# Patient Record
Sex: Female | Born: 1950
Health system: Southern US, Community
[De-identification: ages and names within clinical notes are randomized; demographics above are authoritative.]

## PROBLEM LIST (undated history)

## (undated) DIAGNOSIS — I480 Paroxysmal atrial fibrillation: Secondary | ICD-10-CM

## (undated) DIAGNOSIS — E039 Hypothyroidism, unspecified: Secondary | ICD-10-CM

## (undated) DIAGNOSIS — I499 Cardiac arrhythmia, unspecified: Secondary | ICD-10-CM

## (undated) DIAGNOSIS — D219 Benign neoplasm of connective and other soft tissue, unspecified: Secondary | ICD-10-CM

## (undated) DIAGNOSIS — M810 Age-related osteoporosis without current pathological fracture: Secondary | ICD-10-CM

## (undated) DIAGNOSIS — R011 Cardiac murmur, unspecified: Secondary | ICD-10-CM

## (undated) DIAGNOSIS — IMO0002 Reserved for concepts with insufficient information to code with codable children: Secondary | ICD-10-CM

## (undated) DIAGNOSIS — I4892 Unspecified atrial flutter: Secondary | ICD-10-CM

## (undated) DIAGNOSIS — M1990 Unspecified osteoarthritis, unspecified site: Secondary | ICD-10-CM

## (undated) DIAGNOSIS — F329 Major depressive disorder, single episode, unspecified: Secondary | ICD-10-CM

## (undated) DIAGNOSIS — F32A Depression, unspecified: Secondary | ICD-10-CM

## (undated) HISTORY — PX: TONSILLECTOMY: SUR1361

## (undated) HISTORY — PX: FRACTURE SURGERY: SHX138

## (undated) HISTORY — DX: Paroxysmal atrial fibrillation: I48.0

## (undated) HISTORY — DX: Unspecified atrial flutter: I48.92

## (undated) HISTORY — PX: BREAST ENHANCEMENT SURGERY: SHX7

## (undated) HISTORY — DX: Major depressive disorder, single episode, unspecified: F32.9

## (undated) HISTORY — DX: Benign neoplasm of connective and other soft tissue, unspecified: D21.9

## (undated) HISTORY — PX: ABDOMINAL HYSTERECTOMY: SHX81

## (undated) HISTORY — DX: Depression, unspecified: F32.A

## (undated) HISTORY — DX: Hypothyroidism, unspecified: E03.9

## (undated) HISTORY — DX: Reserved for concepts with insufficient information to code with codable children: IMO0002

---

## 1981-08-07 HISTORY — PX: AUGMENTATION MAMMAPLASTY: SUR837

## 1997-12-08 ENCOUNTER — Other Ambulatory Visit: Admission: RE | Admit: 1997-12-08 | Discharge: 1997-12-08 | Payer: Self-pay | Admitting: Obstetrics and Gynecology

## 1998-03-06 ENCOUNTER — Emergency Department (HOSPITAL_COMMUNITY): Admission: EM | Admit: 1998-03-06 | Discharge: 1998-03-06 | Payer: Self-pay | Admitting: Emergency Medicine

## 1998-12-13 ENCOUNTER — Other Ambulatory Visit: Admission: RE | Admit: 1998-12-13 | Discharge: 1998-12-13 | Payer: Self-pay | Admitting: Gynecology

## 1999-12-14 ENCOUNTER — Other Ambulatory Visit: Admission: RE | Admit: 1999-12-14 | Discharge: 1999-12-14 | Payer: Self-pay | Admitting: Obstetrics and Gynecology

## 2000-12-05 LAB — HM PAP SMEAR

## 2000-12-14 ENCOUNTER — Other Ambulatory Visit: Admission: RE | Admit: 2000-12-14 | Discharge: 2000-12-14 | Payer: Self-pay | Admitting: Obstetrics and Gynecology

## 2001-08-07 HISTORY — PX: COSMETIC SURGERY: SHX468

## 2003-06-12 ENCOUNTER — Ambulatory Visit (HOSPITAL_COMMUNITY): Admission: RE | Admit: 2003-06-12 | Discharge: 2003-06-12 | Payer: Self-pay | Admitting: Gastroenterology

## 2004-02-14 ENCOUNTER — Ambulatory Visit (HOSPITAL_BASED_OUTPATIENT_CLINIC_OR_DEPARTMENT_OTHER): Admission: RE | Admit: 2004-02-14 | Discharge: 2004-02-14 | Payer: Self-pay | Admitting: Gastroenterology

## 2006-05-28 ENCOUNTER — Emergency Department (HOSPITAL_COMMUNITY): Admission: EM | Admit: 2006-05-28 | Discharge: 2006-05-28 | Payer: Self-pay | Admitting: Family Medicine

## 2006-12-06 ENCOUNTER — Ambulatory Visit (HOSPITAL_BASED_OUTPATIENT_CLINIC_OR_DEPARTMENT_OTHER): Admission: RE | Admit: 2006-12-06 | Discharge: 2006-12-06 | Payer: Self-pay | Admitting: Podiatry

## 2007-03-23 ENCOUNTER — Emergency Department (HOSPITAL_COMMUNITY): Admission: EM | Admit: 2007-03-23 | Discharge: 2007-03-23 | Payer: Self-pay | Admitting: Family Medicine

## 2007-03-28 ENCOUNTER — Ambulatory Visit (HOSPITAL_COMMUNITY): Admission: RE | Admit: 2007-03-28 | Discharge: 2007-03-29 | Payer: Self-pay | Admitting: Orthopaedic Surgery

## 2008-01-27 ENCOUNTER — Ambulatory Visit (HOSPITAL_COMMUNITY): Admission: RE | Admit: 2008-01-27 | Discharge: 2008-01-27 | Payer: Self-pay | Admitting: Obstetrics and Gynecology

## 2008-10-13 ENCOUNTER — Emergency Department (HOSPITAL_COMMUNITY): Admission: EM | Admit: 2008-10-13 | Discharge: 2008-10-13 | Payer: Self-pay | Admitting: Emergency Medicine

## 2009-06-21 ENCOUNTER — Emergency Department (HOSPITAL_COMMUNITY): Admission: EM | Admit: 2009-06-21 | Discharge: 2009-06-21 | Payer: Self-pay | Admitting: Family Medicine

## 2009-06-24 ENCOUNTER — Ambulatory Visit (HOSPITAL_BASED_OUTPATIENT_CLINIC_OR_DEPARTMENT_OTHER): Admission: RE | Admit: 2009-06-24 | Discharge: 2009-06-24 | Payer: Self-pay | Admitting: Orthopedic Surgery

## 2009-10-29 ENCOUNTER — Encounter: Admission: RE | Admit: 2009-10-29 | Discharge: 2009-10-29 | Payer: Self-pay | Admitting: Obstetrics and Gynecology

## 2010-03-22 ENCOUNTER — Emergency Department (HOSPITAL_COMMUNITY): Admission: EM | Admit: 2010-03-22 | Discharge: 2010-03-22 | Payer: Self-pay | Admitting: Emergency Medicine

## 2010-10-07 ENCOUNTER — Other Ambulatory Visit: Payer: Self-pay | Admitting: Obstetrics and Gynecology

## 2010-10-07 DIAGNOSIS — Z1231 Encounter for screening mammogram for malignant neoplasm of breast: Secondary | ICD-10-CM

## 2010-11-08 ENCOUNTER — Ambulatory Visit
Admission: RE | Admit: 2010-11-08 | Discharge: 2010-11-08 | Disposition: A | Discharge status: Home / Self Care | Payer: 59 | Source: Ambulatory Visit | Attending: Obstetrics and Gynecology | Admitting: Obstetrics and Gynecology

## 2010-11-08 DIAGNOSIS — Z1231 Encounter for screening mammogram for malignant neoplasm of breast: Secondary | ICD-10-CM

## 2010-11-09 LAB — ANAEROBIC CULTURE: Gram Stain: NONE SEEN

## 2010-11-09 LAB — WOUND CULTURE
Culture: NO GROWTH
Gram Stain: NONE SEEN
Gram Stain: NONE SEEN

## 2010-12-20 NOTE — Op Note (Signed)
Terri [name], Terri [name]               ACCOUNT NO.:  [id number]   MEDICAL RECORD NO.:  [id number]          PATIENT TYPE:  OIB   LOCATION:  5034                         FACILITY:  [facility]   PHYSICIAN:  Vanita Panda. Magnus Ivan, M.D.DATE OF BIRTH:  April 25, 1951   DATE OF PROCEDURE:  03/28/2007  DATE OF DISCHARGE:  03/29/2007                               OPERATIVE REPORT   PREOPERATIVE DIAGNOSIS:  Right displaced radial styloid fracture.   POSTOPERATIVE DIAGNOSIS:  Right displaced radial styloid fracture.   PROCEDURE:  Open reduction and percutaneous pinning, right wrist radial  styloid fracture.   SURGEON:  Vanita Panda. Magnus Ivan, MD   ANESTHESIA:  General.   ANTIBIOTICS:  600 mg of IV clindamycin.   TOURNIQUET TIME:  Fifty-eight minutes   BLOOD LOSS:  Minimal.   COMPLICATIONS:  None.   INDICATIONS:  Briefly, Ms. Terri [name] is a very pleasant right-hand-  dominant 59 year old who was jogging which she sustained a fall on  Saturday.  She had x-rays obtained of her right wrist, which was in  pain, which showed her to have an intra-articular distal radius fracture  which was more of a radial styloid fracture.  She was placed in a splint  and given time for swelling to subside.  Due to the displaced nature of  the fracture and the intra-articular component, I recommended she  undergo an operative intervention.  This is her dominant wrist as well.  She agreed to proceed with surgery.   PROCEDURE DESCRIPTION:  After informed consent was obtained, the  appropriate right arm was marked, at which time she was brought the  operating room and placed supine on the operating table.  General  anesthesia was then obtained.  A nonsterile tourniquet was placed around  her right upper arm.  Her arm was then prepped and draped with Betadine  scrub and paint.  A timeout was called and she was identified as the  correct patient and the correct extremity.  I then used an Esmarch to  wrap out the wrist  and the tourniquet was inflated to 250 mL of  pressure.  Using a fluoroscopic unit/C-arm, I was able to assess the  wrist and after some manipulation, closed, I was able to manipulate the  radial styloid into an anatomically reduced position.  I then made an  incision over the dorsoradial aspect of the wrist to identify the radial  styloid as well as to protect the superficial radial nerve.  I then  passed a 0.025 K-wire from the radial styloid into the distal radius  metaphysis section of bone; I supplemented this with a second 0.045 K-  wire.  In AP lateral and PA planes, this showed the fracture to be  anatomically reduced.  The remainder of the carpal bones appeared to  align well.  I then copiously irrigated the wound and closed the deep  tissue with 2-0 Vicryl followed by interrupted 4-0 Prolene on the skin.  The pins were bent and cut outside of the skin and pin caps were placed.  I then placed Xeroform over the incision and put her in a short  arm  thumb spica  plaster splint.  The tourniquet was let down and the fingers then pinked  up nicely.  Total tourniquet time was 58 minutes.  Blood loss was  minimal.  There were no complications noted and final counts were  correct.  The patient was awakened, extubated and taken to the recovery  room in stable condition.      Vanita Panda. Magnus Ivan, M.D.  Electronically Signed     CYB/MEDQ  D:  03/28/2007  T:  03/29/2007  Job:  [id number]

## 2010-12-23 NOTE — Op Note (Signed)
Terri [name], Terri [name]               ACCOUNT NO.:  [id number]   MEDICAL RECORD NO.:  [id number]          PATIENT TYPE:  AMB   LOCATION:  DSC                          FACILITY:  [facility]   PHYSICIAN:  Ysidro Evert. Regal, D.P.M. DATE OF BIRTH:  28-Mar-1951   DATE OF PROCEDURE:  12/06/2006  DATE OF DISCHARGE:  12/06/2006                               OPERATIVE REPORT   PREOPERATIVE DIAGNOSIS:  Exostosis, left hallux.   POSTOPERATIVE DIAGNOSIS:  Exostosis, left hallux.   PROCEDURE:  Exostectomy of the left hallux to be performed.   INDICATIONS:  Chronic discomfort and inability to wear shoe gear without  difficulty.  Has tried wider shoes.  Has tried different modalities  without relief of symptoms.   SURGEON:  Lenn Sink, D.P.M.   The patient was brought to the OR and placed in the supine position on  the OR table, the patient injected with 5 mL of a Xylocaine-Marcaine  mixture.  The patient's left foot was prepped and draped utilizing  standard aseptic technique.  The left foot was exsanguinated utilizing  Esmarch and the left ankle tourniquet was inflated to 250 mmHg.  The  following procedure was performed.  Attention was directed to the dorsal  aspect left foot, where an approximate 4-cm linear incision was made.  The incision was deepened down to subcutaneous tissue with a linear  capsular incision.  The capsular tissue was sharply dissected off the  underlying bone, revealing a hypertrophied area off of the left hallux  at the interphalangeal joint.  Utilizing power saw, the hyperostosis was  resected flush with all surfaces and was rasped smooth.  The wound was  flushed with a copious amount of sterile __________ solution.  The wound  edges were coapted with 5-0 nylon, a sterile dressing applied,  tourniquet released.  The patient was sent to the recovery room in  satisfactory condition and discharged by anesthesia.           ______________________________  Ysidro Evert. Regal,  D.P.M.     NSR/MEDQ  D:  03/26/2007  T:  03/27/2007  Job:  [id number]

## 2010-12-23 NOTE — Procedures (Signed)
NAME:  Terri [name], Terri [name]             ACCOUNT NO.:  [id number]   MEDICAL RECORD NO.:  [id number]          PATIENT TYPE:  OUT   LOCATION:  SLEEP CENTER                 FACILITY:  [facility]   PHYSICIAN:  Clinton D. Maple Hudson, M.D. DATE OF BIRTH:  05-23-1951   DATE OF ADMISSION:  02/14/2004  DATE OF DISCHARGE:  02/14/2004                              NOCTURNAL POLYSOMNOGRAM   REFFERRING PHYSICIAN:  Dr.  Tasia Catchings   INDICATION FOR STUDY/HISTORY:  Insomnia with sleep apnea.  Complaint of  waking fatigue, snoring, falling asleep watching TV, restless,  nonrestorative sleep, vivid dreams.  Home medications include Zoloft and  Premarin.  Neck size was 12.5 inches, BMI 20.9, weight 118 pounds, Epworth  sleepiness score 12/24.  A split night study protocol was requested.  No  medications were taken before sleep.   SLEEP ARCHITECTURE:  Sleep of 296 minutes were recorded starting at 10:25  p.m. with a sleep efficiency of 74%.  Of total sleep time, 7% was stage 1,  76% stage 2, 6% stage 3 and 4, 11% REM.  Sleep latency was eight minutes.  REM latency was 250 minutes.   RESPIRATORY DATA:  Split protocol was followed.  Baseline RDI was 35.7  obstructive events per hour consistent with moderately severe obstructive  sleep apnea/hypopnea during the initial 158 minutes before CPAP.  This  included 36 obstructive apneas and 46 hypopneas.  All events and all sleep  were while on her back.  RDI during REM was 3.7 per hour.   OXYGEN DATA:  Snoring was moderate.  Mean oxygen saturation through the  study was 95% with mild desaturation to 86% during apneas.   CARDIAC DATA:  Sinus bradycardia around 44-49 beats per minute with no  significant ectopics noted.   MOVEMENT/PARASOMNIA:  There were 267 limb jerks recorded of which 30 were  associated with arousal for a periodic limb movement with arousal index of  6.1 events per hour, indicating mild to moderate periodic limb movement  syndrome as a possible  additional cause of sleep disturbance.   IMPRESSION/RECOMMENDATION:  Moderately severe obstructive sleep  apnea/hypopnea with successful CPAP titration to 9 CWP using a petite  Respironics soft gel mask.  Mild oxygen desaturation before CPAP.  Periodic  limb movement syndrome.                                   ______________________________                                Rennis Chris Maple Hudson, M.D.                                Diplomate, American Board of Sleep Medicine    CDY/MEDQ  D:  02/21/2004 15:01:40  T:  02/21/2004 23:37:22  Job:  [id number]

## 2010-12-23 NOTE — Op Note (Signed)
NAMEAMBRIANA, Terri [name]               ACCOUNT NO.:  [id number]   MEDICAL RECORD NO.:  [id number]          PATIENT TYPE:  AMB   LOCATION:  DSC                          FACILITY:  [facility]   PHYSICIAN:  Ysidro Evert. Regal, D.P.M. DATE OF BIRTH:  04-25-1951   DATE OF PROCEDURE:  02/22/2007  DATE OF DISCHARGE:                               OPERATIVE REPORT   PREOPERATIVE DIAGNOSIS:  Hypertrophy condyle left hallux.   POSTOPERATIVE DIAGNOSIS:  Hypertrophy condyle left hallux.   PROCEDURE:  Excision of hypertrophy condyle left hallux.   INDICATIONS:  Chronic discomfort, inability to wear shoe gear  comfortably.  The patient has tried wider shoes, has tried padding  without relief of symptoms.   SURGEON:  Dr. Charlsie Merles.   OPERATIVE REPORT:  The patient was brought to the OR and placed in a  supine position on the OR table.  The patient was injected with a total  of 5 mL of a lidocaine and Marcaine mixture.  The patient's left foot  was prepped and draped utilizing aseptic technique, and a left ankle  tourniquet was  inflated to 250 mmHg.  The following procedure was  performed.   Attention was directed to the dorsomedial aspect of the left hallux  where a 4 cm linear incision was made.  The incision was deepened  through the subcutaneous tissue and down to capsule, where a linear  capsule incision was made.  The capsule tissue was sharply dissection  off the underlying bone revealing a prominent hypertrophy condyle, and  was resected at this position.  The patient tolerated this well, and the  area was flushed and found to be in excellent position.  The skin  margins were reapproximated utilizing 5-0 nylon, and the area was  infiltrated with a 0.5 mL of dexamethasone.  A dry sterile  compressive  dressing applied to the left foot.  The left ankle tourniquet was  deflated.  Capillary fill was noted to be immediate to all digits of the  left foot, and the patient tolerated the surgery and anesthesia  well,  and was transferred out of the OR in satisfactory condition.           ______________________________  Ysidro Evert Regal, D.P.M.     NSR/MEDQ  D:  02/22/2007  T:  02/22/2007  Job:  [id number]

## 2011-05-19 LAB — CBC
HCT: 37.4
MCV: 94.9

## 2011-10-19 ENCOUNTER — Other Ambulatory Visit (HOSPITAL_COMMUNITY): Payer: 59

## 2011-10-19 ENCOUNTER — Emergency Department (HOSPITAL_COMMUNITY)
Admit: 2011-10-19 | Discharge: 2011-10-19 | Disposition: A | Discharge status: Home / Self Care | Payer: 59 | Attending: Ophthalmology | Admitting: Ophthalmology

## 2011-10-19 ENCOUNTER — Other Ambulatory Visit: Payer: Self-pay | Admitting: Internal Medicine

## 2011-10-19 ENCOUNTER — Encounter (HOSPITAL_COMMUNITY): Payer: Self-pay | Admitting: Emergency Medicine

## 2011-10-19 ENCOUNTER — Emergency Department (HOSPITAL_COMMUNITY)
Admission: EM | Admit: 2011-10-19 | Discharge: 2011-10-19 | Disposition: A | Discharge status: Home / Self Care | Payer: 59 | Attending: Emergency Medicine | Admitting: Emergency Medicine

## 2011-10-19 DIAGNOSIS — Z79899 Other long term (current) drug therapy: Secondary | ICD-10-CM | POA: Insufficient documentation

## 2011-10-19 DIAGNOSIS — R51 Headache: Secondary | ICD-10-CM

## 2011-10-19 DIAGNOSIS — H02402 Unspecified ptosis of left eyelid: Secondary | ICD-10-CM

## 2011-10-19 DIAGNOSIS — R519 Headache, unspecified: Secondary | ICD-10-CM

## 2011-10-19 DIAGNOSIS — G9389 Other specified disorders of brain: Secondary | ICD-10-CM | POA: Insufficient documentation

## 2011-10-19 DIAGNOSIS — H02409 Unspecified ptosis of unspecified eyelid: Secondary | ICD-10-CM | POA: Insufficient documentation

## 2011-10-19 DIAGNOSIS — H5710 Ocular pain, unspecified eye: Secondary | ICD-10-CM | POA: Insufficient documentation

## 2011-10-19 MED ORDER — GADOBENATE DIMEGLUMINE 529 MG/ML IV SOLN
10.0000 mL | Freq: Once | INTRAVENOUS | Status: AC
  Administered 2011-10-19: 10 mL via INTRAVENOUS

## 2011-10-19 NOTE — ED Notes (Signed)
Pt sent to ED for MRI/MRA of neck, brain and orbits.  Pt c/o left eye drooping and pain behind eye x's 6 days.

## 2011-10-19 NOTE — ED Notes (Signed)
Pt in MRI.

## 2011-10-19 NOTE — ED Provider Notes (Signed)
History     CSN: [id number]  Arrival date & time 10/19/11  1638   First MD Initiated Contact with Patient 10/19/11 2001      Chief Complaint  Patient presents with  . Eye Pain    (Consider location/radiation/quality/duration/timing/severity/associated sxs/prior treatment) Patient is a 61 y.o. female presenting with eye pain. The history is provided by the patient.  Eye Pain This is a new problem. The current episode started more than 1 week ago. The problem occurs constantly. Associated symptoms include headaches.    History reviewed. No pertinent past medical history.  Past Surgical History  Procedure Date  . Abdominal hysterectomy   . Breast enhancement surgery   . Fracture surgery     No family history on file.  History  Substance Use Topics  . Smoking status: Never Smoker   . Smokeless tobacco: Not on file  . Alcohol Use: Yes     1 glass wine per night    OB History    Grav Para Term Preterm Abortions TAB SAB Ect Mult Living                  Review of Systems  Eyes: Positive for pain. Negative for photophobia, redness and visual disturbance.  Respiratory: Negative for choking.   Neurological: Positive for facial asymmetry and headaches. Negative for tremors and syncope.    Allergies  Review of patient's allergies indicates no known allergies.  Home Medications   Current Outpatient Rx  Name Route Sig Dispense Refill  . BIOTIN 5000 MCG PO CAPS Oral Take 1 capsule by mouth daily.    Marland Kitchen CALCIUM CARBONATE-VITAMIN D 500-200 MG-UNIT PO TABS Oral Take 1 tablet by mouth daily.    Marland Kitchen ESTROGENS CONJUGATED 0.45 MG PO TABS Oral Take 0.45 mg by mouth daily. Take daily for 21 days then do not take for 7 days.    . OMEGA-3 FATTY ACIDS 1000 MG PO CAPS Oral Take 1 g by mouth daily.    Marland Kitchen LEVOTHYROXINE SODIUM 50 MCG PO TABS Oral Take 50 mcg by mouth daily.    Marland Kitchen MAGNESIUM GLUCONATE 500 MG PO TABS Oral Take 500 mg by mouth daily.    . ADULT MULTIVITAMIN W/MINERALS CH Oral  Take 1 tablet by mouth daily.    . SERTRALINE HCL 100 MG PO TABS Oral Take 150 mg by mouth daily.      BP 100/65  Pulse 60  Temp(Src) 97.6 F (36.4 C) (Oral)  Resp 14  SpO2 98%  Physical Exam  Constitutional: She is oriented to person, place, and time. She appears well-developed.  HENT:  Head: Normocephalic.  Eyes: Conjunctivae and EOM are normal. Pupils are equal, round, and reactive to light.       Mild ptosis on the left side.   Cardiovascular: Normal rate.   Pulmonary/Chest: Effort normal.  Neurological: She is alert and oriented to person, place, and time. A cranial nerve deficit is present.  Skin: Skin is warm.    ED Course  Procedures (including critical care time)  Labs Reviewed - No data to display Mr Maxine Glenn Head Wo Contrast  10/19/2011  *RADIOLOGY REPORT*  Clinical Data:  Horner's syndrome  MRI HEAD WITHOUT AND WITH CONTRAST MRA HEAD WITHOUT CONTRAST MRA NECK WITHOUT AND WITH CONTRAST  Technique:  Multiplanar, multiecho pulse sequences of the brain and surrounding structures were obtained without and with intravenous contrast.  Angiographic images of the Circle of Willis were obtained using MRA technique without intravenous contrast. Angiographic images  of the neck were obtained using MRA technique without and with intravenous contrast.  Carotid stenosis measurements (when applicable) are obtained utilizing NASCET criteria, using the distal internal carotid diameter as the denominator.  Contrast:  15 ml Multihance  Comparison:   None.  MRI HEAD  Findings:  Diffusion imaging does not show any acute or subacute infarction.  The brainstem and cerebellum are normal.  The cerebral hemispheres show mild chronic small vessel change within the hemispheric white matter.  No cortical or large vessel territory abnormality.  No mass lesion, hemorrhage, hydrocephalus or extra- axial collection.  No pituitary mass.  No inflammatory sinus disease.  IMPRESSION: No acute or reversible findings.   Mild chronic small vessel change of the hemispheric white matter.  MRA HEAD  Findings: Both internal carotid arteries are widely patent into the brain.  The anterior and middle cerebral vessels appear normal without proximal stenosis, aneurysm or vascular malformation.  Both posterior cerebral arteries take a fetal origin from the anterior circulation.  The posterior circulation is diminutive because of the fetal origin.  The right vertebral artery terminates in pica.  The left vertebral arteries patent to the basilar.  IMPRESSION: No acquired or significant vascular pathology.  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  Diminutive posterior circulation in that setting.  MRA NECK  Findings: The branching pattern of brachiocephalic vessels from the arch is normal.  No origin stenoses.  Both common carotid arteries are widely patent to their respective bifurcation.  Both carotid bifurcations are normal.  Both cervical internal carotid arteries are normal.  Both vertebral arteries are normal, though diminutive. No subclavian pathology is evident.  IMPRESSION: Normal MR angiography of the neck vessels.  Original Report Authenticated By: Thomasenia Sales, M.D.   Mr Angiogram Neck W Wo Contrast  10/19/2011  *RADIOLOGY REPORT*  Clinical Data:  Horner's syndrome  MRI HEAD WITHOUT AND WITH CONTRAST MRA HEAD WITHOUT CONTRAST MRA NECK WITHOUT AND WITH CONTRAST  Technique:  Multiplanar, multiecho pulse sequences of the brain and surrounding structures were obtained without and with intravenous contrast.  Angiographic images of the Circle of Willis were obtained using MRA technique without intravenous contrast. Angiographic images of the neck were obtained using MRA technique without and with intravenous contrast.  Carotid stenosis measurements (when applicable) are obtained utilizing NASCET criteria, using the distal internal carotid diameter as the  denominator.  Contrast:  15 ml Multihance  Comparison:   None.  MRI HEAD  Findings:  Diffusion imaging does not show any acute or subacute infarction.  The brainstem and cerebellum are normal.  The cerebral hemispheres show mild chronic small vessel change within the hemispheric white matter.  No cortical or large vessel territory abnormality.  No mass lesion, hemorrhage, hydrocephalus or extra- axial collection.  No pituitary mass.  No inflammatory sinus disease.  IMPRESSION: No acute or reversible findings.  Mild chronic small vessel change of the hemispheric white matter.  MRA HEAD  Findings: Both internal carotid arteries are widely patent into the brain.  The anterior and middle cerebral vessels appear normal without proximal stenosis, aneurysm or vascular malformation.  Both posterior cerebral arteries take a fetal origin from the anterior circulation.  The posterior circulation is diminutive because of the fetal origin.  The right vertebral artery terminates in pica.  The left vertebral arteries patent to the basilar.  IMPRESSION: No acquired or significant vascular pathology.  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  Diminutive posterior circulation in that setting.  MRA NECK  Findings: The branching pattern of brachiocephalic vessels from the arch is normal.  No origin stenoses.  Both common carotid arteries are widely patent to their respective bifurcation.  Both carotid bifurcations are normal.  Both cervical internal carotid arteries are normal.  Both vertebral arteries are normal, though diminutive. No subclavian pathology is evident.  IMPRESSION: Normal MR angiography of the neck vessels.  Original Report Authenticated By: Thomasenia Sales, M.D.   Mr Laqueta Jean Wo Contrast  10/19/2011  *RADIOLOGY REPORT*  Clinical Data:  Horner's syndrome  MRI HEAD WITHOUT AND WITH CONTRAST MRA HEAD WITHOUT CONTRAST MRA NECK WITHOUT AND WITH CONTRAST   Technique:  Multiplanar, multiecho pulse sequences of the brain and surrounding structures were obtained without and with intravenous contrast.  Angiographic images of the Circle of Willis were obtained using MRA technique without intravenous contrast. Angiographic images of the neck were obtained using MRA technique without and with intravenous contrast.  Carotid stenosis measurements (when applicable) are obtained utilizing NASCET criteria, using the distal internal carotid diameter as the denominator.  Contrast:  15 ml Multihance  Comparison:   None.  MRI HEAD  Findings:  Diffusion imaging does not show any acute or subacute infarction.  The brainstem and cerebellum are normal.  The cerebral hemispheres show mild chronic small vessel change within the hemispheric white matter.  No cortical or large vessel territory abnormality.  No mass lesion, hemorrhage, hydrocephalus or extra- axial collection.  No pituitary mass.  No inflammatory sinus disease.  IMPRESSION: No acute or reversible findings.  Mild chronic small vessel change of the hemispheric white matter.  MRA HEAD  Findings: Both internal carotid arteries are widely patent into the brain.  The anterior and middle cerebral vessels appear normal without proximal stenosis, aneurysm or vascular malformation.  Both posterior cerebral arteries take a fetal origin from the anterior circulation.  The posterior circulation is diminutive because of the fetal origin.  The right vertebral artery terminates in pica.  The left vertebral arteries patent to the basilar.  IMPRESSION: No acquired or significant vascular pathology.  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  Diminutive posterior circulation in that setting.  MRA NECK  Findings: The branching pattern of brachiocephalic vessels from the arch is normal.  No origin stenoses.  Both common carotid arteries are widely patent to their respective  bifurcation.  Both carotid bifurcations are normal.  Both cervical internal carotid arteries are normal.  Both vertebral arteries are normal, though diminutive. No subclavian pathology is evident.  IMPRESSION: Normal MR angiography of the neck vessels.  Original Report Authenticated By: Thomasenia Sales, M.D.     1. Ptosis of eyelid, left   2. Headache       MDM  Patient was seen by her ophthalmologist and sent him. She's had left eye ptosis and some pain behind her eye for about 6 days. She does not have a thought that he pupillary involvement. There is no sensation changes. She was sent in with orders for an MRI/MRA of the neck brain and head. They were negative. Discussed with the ophthalmologist, and he will see her in the office tomorrow        Juliet Rude. Rubin Payor, MD 10/19/11 [id number]

## 2011-10-20 ENCOUNTER — Other Ambulatory Visit: Payer: 59

## 2011-10-25 ENCOUNTER — Other Ambulatory Visit: Payer: Self-pay | Admitting: Obstetrics and Gynecology

## 2011-10-25 DIAGNOSIS — Z1231 Encounter for screening mammogram for malignant neoplasm of breast: Secondary | ICD-10-CM

## 2011-11-21 ENCOUNTER — Ambulatory Visit
Admission: RE | Admit: 2011-11-21 | Discharge: 2011-11-21 | Disposition: A | Discharge status: Home / Self Care | Payer: 59 | Source: Ambulatory Visit | Attending: Obstetrics and Gynecology | Admitting: Obstetrics and Gynecology

## 2011-11-21 DIAGNOSIS — Z1231 Encounter for screening mammogram for malignant neoplasm of breast: Secondary | ICD-10-CM

## 2012-10-23 ENCOUNTER — Other Ambulatory Visit: Payer: Self-pay

## 2012-10-23 DIAGNOSIS — Z1231 Encounter for screening mammogram for malignant neoplasm of breast: Secondary | ICD-10-CM

## 2012-11-21 ENCOUNTER — Ambulatory Visit
Admission: RE | Admit: 2012-11-21 | Discharge: 2012-11-21 | Disposition: A | Discharge status: Home / Self Care | Payer: 59 | Source: Ambulatory Visit

## 2012-11-21 DIAGNOSIS — Z1231 Encounter for screening mammogram for malignant neoplasm of breast: Secondary | ICD-10-CM

## 2012-12-20 ENCOUNTER — Ambulatory Visit (INDEPENDENT_AMBULATORY_CARE_PROVIDER_SITE_OTHER): Payer: 59 | Admitting: Family Medicine

## 2012-12-20 VITALS — BP 100/70 | HR 52 | Temp 97.7°F | Resp 16 | Ht 63.0 in | Wt 114.8 lb

## 2012-12-20 DIAGNOSIS — H9209 Otalgia, unspecified ear: Secondary | ICD-10-CM

## 2012-12-20 DIAGNOSIS — H9202 Otalgia, left ear: Secondary | ICD-10-CM

## 2012-12-20 DIAGNOSIS — S161XXA Strain of muscle, fascia and tendon at neck level, initial encounter: Secondary | ICD-10-CM

## 2012-12-20 DIAGNOSIS — S139XXA Sprain of joints and ligaments of unspecified parts of neck, initial encounter: Secondary | ICD-10-CM

## 2012-12-20 MED ORDER — METHOCARBAMOL 500 MG PO TABS: ORAL_TABLET | ORAL | Status: DC
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
## 2012-12-20 NOTE — Patient Instructions (Signed)
Aleve 2 tablets twice daily for pain and inflammation  Robaxin 500 mg one in the morning, one at noon, 2 at bedtime  CHOCOLATE!   It will cure anything.

## 2012-12-26 ENCOUNTER — Ambulatory Visit (INDEPENDENT_AMBULATORY_CARE_PROVIDER_SITE_OTHER): Payer: 59 | Admitting: Family Medicine

## 2012-12-26 VITALS — BP 98/58 | HR 59 | Temp 98.0°F | Resp 17 | Wt 115.0 lb

## 2012-12-26 DIAGNOSIS — R110 Nausea: Secondary | ICD-10-CM

## 2012-12-26 DIAGNOSIS — R51 Headache: Secondary | ICD-10-CM

## 2012-12-26 DIAGNOSIS — R42 Dizziness and giddiness: Secondary | ICD-10-CM
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
## 2012-12-31 ENCOUNTER — Ambulatory Visit (HOSPITAL_COMMUNITY): Payer: 59

## 2013-03-17 DIAGNOSIS — Z0271 Encounter for disability determination: Secondary | ICD-10-CM

## 2013-04-25 ENCOUNTER — Ambulatory Visit (INDEPENDENT_AMBULATORY_CARE_PROVIDER_SITE_OTHER): Payer: 59 | Admitting: Obstetrics and Gynecology

## 2013-04-25 ENCOUNTER — Encounter: Payer: Self-pay | Admitting: Obstetrics and Gynecology

## 2013-04-25 ENCOUNTER — Ambulatory Visit: Payer: Self-pay | Admitting: Obstetrics and Gynecology

## 2013-04-25 VITALS — BP 122/70 | HR 56 | Ht 63.0 in | Wt 117.5 lb

## 2013-04-25 DIAGNOSIS — Z01419 Encounter for gynecological examination (general) (routine) without abnormal findings: Secondary | ICD-10-CM

## 2013-04-25 DIAGNOSIS — M899 Disorder of bone, unspecified: Secondary | ICD-10-CM

## 2013-04-25 DIAGNOSIS — R1900 Intra-abdominal and pelvic swelling, mass and lump, unspecified site: Secondary | ICD-10-CM

## 2013-04-25 DIAGNOSIS — M8580 Other specified disorders of bone density and structure, unspecified site: Secondary | ICD-10-CM

## 2013-04-25 DIAGNOSIS — Z0000 Encounter for general adult medical examination without abnormal findings: Secondary | ICD-10-CM

## 2013-04-25 LAB — POCT URINALYSIS DIPSTICK
Bilirubin, UA: NEGATIVE
Blood, UA: NEGATIVE
Glucose, UA: NEGATIVE
Leukocytes, UA: NEGATIVE
Nitrite, UA: NEGATIVE
Protein, UA: NEGATIVE
Urobilinogen, UA: NEGATIVE
pH, UA: 5

## 2013-04-25 MED ORDER — ESTROGENS CONJUGATED 0.45 MG PO TABS: 0.4500 mg | ORAL_TABLET | Freq: Every day | ORAL | Status: DC

## 2013-04-25 NOTE — Patient Instructions (Signed)
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
## 2013-05-01 ENCOUNTER — Telehealth: Payer: Self-pay | Admitting: Obstetrics and Gynecology

## 2013-05-01 NOTE — Telephone Encounter (Signed)
Spoke with pt about Dr. Rica Records recommendation. Patient is scheduled for a repeat pelvic exam.

## 2013-05-14 ENCOUNTER — Encounter: Payer: Self-pay | Admitting: Obstetrics and Gynecology

## 2013-05-14 ENCOUNTER — Ambulatory Visit (INDEPENDENT_AMBULATORY_CARE_PROVIDER_SITE_OTHER): Payer: 59 | Admitting: Obstetrics and Gynecology

## 2013-05-14 VITALS — BP 110/68 | HR 64 | Ht 63.0 in | Wt 116.5 lb

## 2013-05-14 DIAGNOSIS — R1900 Intra-abdominal and pelvic swelling, mass and lump, unspecified site: Secondary | ICD-10-CM

## 2013-05-14 NOTE — Progress Notes (Signed)
Patient ID: Terri [name], female   DOB: 10-06-1950, 62 y.o.   MRN: [id number]  Subjective  Patient is here for a pelvic recheck   Noted to have a pelvic mass on routine exam on 04/25/13. No complaints.  Remodeling her house.  Objective  Pelvic exam - normal external genitalia and urethra. Vaginal cuff - no visible lesions. Bimanual - soft midline mass approximately 4 cm, nontenter.  Assessment  Possible pelvic mass.  Return for pelvic ultrasound.

## 2013-05-14 NOTE — Patient Instructions (Signed)
Please return for your pelvic ultrasound.

## 2013-05-15 ENCOUNTER — Telehealth: Payer: Self-pay | Admitting: Obstetrics and Gynecology

## 2013-05-15 NOTE — Telephone Encounter (Signed)
Patient called you back during lunch.

## 2013-05-15 NOTE — Telephone Encounter (Signed)
LMTCB to scheduling PUS.

## 2013-05-16 NOTE — Telephone Encounter (Signed)
Patient scheduled for 10/16 @ 11:30am.

## 2013-05-22 ENCOUNTER — Ambulatory Visit (INDEPENDENT_AMBULATORY_CARE_PROVIDER_SITE_OTHER): Payer: 59

## 2013-05-22 ENCOUNTER — Encounter: Payer: Self-pay | Admitting: Obstetrics and Gynecology

## 2013-05-22 ENCOUNTER — Ambulatory Visit (INDEPENDENT_AMBULATORY_CARE_PROVIDER_SITE_OTHER): Payer: 59 | Admitting: Obstetrics and Gynecology

## 2013-05-22 VITALS — BP 100/70 | HR 54 | Ht 63.0 in | Wt 115.5 lb

## 2013-05-22 DIAGNOSIS — R1907 Generalized intra-abdominal and pelvic swelling, mass and lump: Secondary | ICD-10-CM

## 2013-05-22 DIAGNOSIS — R1900 Intra-abdominal and pelvic swelling, mass and lump, unspecified site: Secondary | ICD-10-CM

## 2013-05-22 NOTE — Patient Instructions (Signed)
Please return for your usual annual exam!  I am glad that all is well with your pelvic ultrasound.

## 2013-05-22 NOTE — Progress Notes (Signed)
Subjective  Patient here for pelvic ultrasound.   Pelvic mass noted on examination. Status post hysterectomy.  Objective  See ultrasound below - normal ovaries, absent uterus, multiple loops of bowel at vaginal cuff.    Assessment  No pelvic mass noted.  Plan  Reassurance given. Follow up for yearly examination.

## 2013-06-10 ENCOUNTER — Ambulatory Visit
Admission: RE | Admit: 2013-06-10 | Discharge: 2013-06-10 | Disposition: A | Discharge status: Home / Self Care | Payer: 59 | Source: Ambulatory Visit | Attending: Obstetrics and Gynecology | Admitting: Obstetrics and Gynecology

## 2013-06-10 DIAGNOSIS — M8580 Other specified disorders of bone density and structure, unspecified site: Secondary | ICD-10-CM

## 2013-06-12 ENCOUNTER — Telehealth: Payer: Self-pay

## 2013-06-12 NOTE — Telephone Encounter (Signed)
Returning a call to Amanda.

## 2013-06-12 NOTE — Telephone Encounter (Signed)
Patient notified of results.

## 2013-06-12 NOTE — Telephone Encounter (Signed)
Message copied by Alphonsa Overall on Thu Jun 12, 2013  9:52 AM ------      Message from: Conley Simmonds      Created: Thu Jun 12, 2013  7:11 AM       Please inform the patient that her bone density is stable from her last examination.      She has osteopenia of the hip and a normal bone density of the spine.            Continue vitamin D, calcium, and exercise.            Next bone density in 2 years. ------

## 2013-06-12 NOTE — Telephone Encounter (Signed)
Called pt. LMOVM to call for bone density results.

## 2013-06-16 ENCOUNTER — Other Ambulatory Visit: Payer: Self-pay | Admitting: Gastroenterology

## 2013-07-25 ENCOUNTER — Other Ambulatory Visit: Payer: Self-pay | Admitting: Orthopedic Surgery

## 2013-07-25 ENCOUNTER — Telehealth: Payer: Self-pay | Admitting: Obstetrics and Gynecology

## 2013-07-25 NOTE — Telephone Encounter (Signed)
Patient would like to change Premarin to generic. [facility].

## 2013-07-25 NOTE — Telephone Encounter (Signed)
Spoke with pt whose husband is a Teacher, early years/pre. They have been doing some investigating and none of the [employer] plans will cover premarin. Pt isn't sure she really wants to stay on it. Pt still has more than a month's worth of it. Advised that Dr. Edward Jolly is out of town but will be back on Monday. Pt wondering what Dr. Edward Jolly suggests about discontinuing. Pt fine with waiting until next week.

## 2013-07-29 ENCOUNTER — Encounter (HOSPITAL_COMMUNITY)
Admission: RE | Disposition: A | Discharge status: Home / Self Care | Payer: Self-pay | Source: Ambulatory Visit | Attending: Gastroenterology

## 2013-07-29 ENCOUNTER — Encounter (HOSPITAL_COMMUNITY): Payer: Self-pay | Admitting: *Deleted

## 2013-07-29 ENCOUNTER — Ambulatory Visit (HOSPITAL_COMMUNITY)
Admission: RE | Admit: 2013-07-29 | Discharge: 2013-07-29 | Disposition: A | Discharge status: Home / Self Care | Payer: 59 | Source: Ambulatory Visit | Attending: Gastroenterology | Admitting: Gastroenterology

## 2013-07-29 DIAGNOSIS — E039 Hypothyroidism, unspecified: Secondary | ICD-10-CM | POA: Insufficient documentation

## 2013-07-29 DIAGNOSIS — I4891 Unspecified atrial fibrillation: Secondary | ICD-10-CM | POA: Insufficient documentation

## 2013-07-29 DIAGNOSIS — Z538 Procedure and treatment not carried out for other reasons: Secondary | ICD-10-CM | POA: Insufficient documentation

## 2013-07-29 DIAGNOSIS — G4730 Sleep apnea, unspecified: Secondary | ICD-10-CM | POA: Insufficient documentation

## 2013-07-29 DIAGNOSIS — Z1211 Encounter for screening for malignant neoplasm of colon: Secondary | ICD-10-CM | POA: Insufficient documentation

## 2013-07-29 HISTORY — PX: FLEXIBLE SIGMOIDOSCOPY: SHX5431

## 2013-07-29 HISTORY — DX: Unspecified osteoarthritis, unspecified site: M19.90

## 2013-07-29 SURGERY — SIGMOIDOSCOPY, FLEXIBLE
Anesthesia: Moderate Sedation

## 2013-07-29 MED ORDER — SODIUM CHLORIDE 0.9 % IV SOLN
INTRAVENOUS | Status: DC
  Administered 2013-07-29: 500 mL via INTRAVENOUS

## 2013-07-29 MED ORDER — MIDAZOLAM HCL 5 MG/5ML IJ SOLN
INTRAMUSCULAR | Status: DC | PRN
  Administered 2013-07-29: 5 mg
  Administered 2013-07-29: 2 mg via INTRAVENOUS

## 2013-07-29 MED ORDER — FENTANYL CITRATE 0.05 MG/ML IJ SOLN
INTRAMUSCULAR | Status: AC
  Filled 2013-07-29: qty 2

## 2013-07-29 MED ORDER — FENTANYL CITRATE 0.05 MG/ML IJ SOLN
INTRAMUSCULAR | Status: DC | PRN
  Administered 2013-07-29: 50 ug via INTRAVENOUS

## 2013-07-29 MED ORDER — MIDAZOLAM HCL 10 MG/2ML IJ SOLN
INTRAMUSCULAR | Status: AC
  Filled 2013-07-29: qty 2

## 2013-07-29 NOTE — H&P (Signed)
Procedure: Screening colonoscopy  History: The patient is a 62 year old female born [birth year]. She underwent a normal screening colonoscopy on 06/12/2003. She is scheduled to undergo a repeat screening colonoscopy today.  Past medical history: Depression. Sleep apnea. Degenerative joint disease of the knees. Hypothyroidism. Breast implants. Vaginal hysterectomy. Eyelid surgery. Fractured right wrist surgery.  Exam: The patient is alert and lying comfortably on the endoscopy stretcher. Lungs are clear to auscultation. Cardiac exam regular rhythm. Abdomen is soft, flat, and nontender to palpation.  Plan: Proceed with repeat screening colonoscopy

## 2013-07-29 NOTE — Progress Notes (Signed)
Dr. Laural Benes spoken with patient and husband regarding ekg.Dr Laural Benes will arrange for appointment with Dr Kevan Ny.

## 2013-07-29 NOTE — Telephone Encounter (Signed)
If patient would like to stop her estrogen therapy, I would suggest that she wean off and try to split the Premarin in half for the remainder of her prescription.  She may experience some rebound hot flashes, which should spontaneously resolve.    She will need to continue with calcium, vit D, and weight bearing exercise to protect her bones due to her osteopenia, as the estrogen is giving her some protection against osteoporosis.

## 2013-07-29 NOTE — Op Note (Signed)
Procedure: Screening flexible proctosigmoidoscopy to the splenic flexure  Endoscopist: Danise Edge  Premedication: Versed 7 mg. Fentanyl 50 mcg  Procedure: The patient was placed in the left lateral decubitus position. Anal inspection and digital rectal exam were normal. The Pentax pediatric colonoscope was introduced into the rectum and advanced to the splenic flexure. A complete colonoscopy was not performed because the colon prep was unsatisfactory.  Rectum. Normal. Retroflexed view of the distal rectum normal.  Sigmoid colon and descending colon. Dark liquid and solid stool were present in the left colon. The colonic mucosa was able to examine appeared normal.  Splenic flexure. Normal  Assessment:  #1. Unsatisfactory colon prep to screen for colon polyps.  #2. Normal screening flexible proctosigmoidoscopy associated with a poorly prepped colon  Recommendations: If the patient decides to undergo a screening colonoscopy using another colon prep, I would recommend using propofol sedation. If the patient decides not to undergo a screening colonoscopy, I will discuss all the Cologuard stool DNA screening for colon cancer to

## 2013-07-30 ENCOUNTER — Encounter (HOSPITAL_COMMUNITY): Payer: Self-pay | Admitting: Gastroenterology

## 2013-07-30 NOTE — Telephone Encounter (Signed)
Return call to patient, Terri [name].  

## 2013-07-30 NOTE — Telephone Encounter (Signed)
Patient notified of Dr Rica Records response.  She will try weaning off the Premarin and if she does not like how she feels, she will call back to see if generic for Premarin, ?estradiol, is an option for her.  Instructions for [facility] protection give per dr Rica Records recommendation.

## 2013-08-21 ENCOUNTER — Ambulatory Visit: Payer: 59 | Admitting: Interventional Cardiology

## 2013-08-28 ENCOUNTER — Encounter: Payer: Self-pay | Admitting: Cardiology

## 2013-08-28 ENCOUNTER — Ambulatory Visit (INDEPENDENT_AMBULATORY_CARE_PROVIDER_SITE_OTHER): Payer: 59 | Admitting: Cardiology

## 2013-08-28 ENCOUNTER — Encounter (INDEPENDENT_AMBULATORY_CARE_PROVIDER_SITE_OTHER): Payer: Self-pay

## 2013-08-28 VITALS — BP 107/43 | HR 54 | Ht 63.0 in | Wt 118.8 lb

## 2013-08-28 DIAGNOSIS — R0602 Shortness of breath: Secondary | ICD-10-CM

## 2013-08-28 DIAGNOSIS — R42 Dizziness and giddiness: Secondary | ICD-10-CM | POA: Insufficient documentation

## 2013-08-28 DIAGNOSIS — I4891 Unspecified atrial fibrillation: Secondary | ICD-10-CM

## 2013-08-28 DIAGNOSIS — I480 Paroxysmal atrial fibrillation: Secondary | ICD-10-CM

## 2013-08-28 DIAGNOSIS — E785 Hyperlipidemia, unspecified: Secondary | ICD-10-CM

## 2013-08-28 DIAGNOSIS — R002 Palpitations: Secondary | ICD-10-CM

## 2013-08-28 MED ORDER — ASCRIPTIN 325 MG PO TABS: 325.0000 mg | ORAL_TABLET | Freq: Every day | ORAL | Status: DC

## 2013-08-28 MED ORDER — ASPIRIN EC 325 MG PO TBEC: 325.0000 mg | DELAYED_RELEASE_TABLET | Freq: Every day | ORAL | Status: DC

## 2013-08-28 NOTE — Patient Instructions (Addendum)
Your physician has requested that you have an echocardiogram. Echocardiography is a painless test that uses sound waves to create images of your heart. It provides your doctor with information about the size and shape of your heart and how well your heart's chambers and valves are working. This procedure takes approximately one hour. There are no restrictions for this procedure.  Your physician has recommended that you wear an 14 day [employer] event monitor. Event monitors are medical devices that record the heart's electrical activity. Doctors most often [place] these monitors to diagnose arrhythmias. Arrhythmias are problems with the speed or rhythm of the heartbeat. The monitor is a small, portable device. You can wear one while you do your normal daily activities. This is usually used to diagnose what is causing palpitations/syncope (passing out).  Your physician has requested that you have an exercise tolerance test. For further information please visit [email or web address]. Please also follow instruction sheet, as given.  Your physician has recommended you make the following change in your medication: start taking Aspirin 325 mg daily  Your physician recommends that you return for lab work in: 09/15/13 (please do not eat or drink after midnight the night before labs are drawn)  Your physician recommends that you schedule a follow-up appointment in: After tests are complete  PLEASE SCHEDULE ALL TESTS TO BE DONE AFTER 09/07/13

## 2013-08-28 NOTE — Progress Notes (Signed)
Patient ID: [name], female   DOB: 09/26/1950, 63 y.o.   MRN: [id number]     Patient Name: Terri [name] Date of Encounter: 08/28/2013  Primary Care Provider:  [name], MD Primary Cardiologist:  Ena Dawley, H  Problem List   Past Medical History  Diagnosis Date  . Depression   . Thyroid disease   . Dyspareunia   . Fibroid   . Arthritis     lt shoulder   Past Surgical History  Procedure Laterality Date  . Abdominal hysterectomy    . Breast enhancement surgery    . Fracture surgery Right     wrist  . Cosmetic surgery    . Augmentation mammaplasty    . Flexible sigmoidoscopy N/A 07/29/2013    Procedure: FLEXIBLE SIGMOIDOSCOPY;  Surgeon: [name], MD;  Location: [facility];  Service: Endoscopy;  Laterality: N/A;    Allergies  No Known Allergies  HPI  A very pleasant 63 year old female with no significant past medical history who has been very active throughout her life and continues exercising 5-6 times a week for about an hour daily. The patient started to have palpitations associated with shortness of breath and dizziness that start few minutes after she stops exercising and usually last for a few minutes when she has to sit down. On few occasions she felt like she's going to pass out but she didn't. The patient didn't really pay much attention to it until she went for colonoscopy to add was terminated prematurely because of a finding of atrial fibrillation with RVR during colonoscopy. Patient otherwise denies chest pain, exertional shortness of breath, orthopnea, person nocturnal dyspnea, or edema. She doesn't have a history of heart disease in her family.   Home Medications  Prior to Admission medications   Medication Sig Start Date End Date Taking? Authorizing Provider  Biotin 5000 MCG CAPS Take 1 capsule by mouth daily.   Yes Historical Provider, MD  estrogens, conjugated, (PREMARIN) 0.45 MG tablet Take 0.45 mg by mouth every other  day. 04/25/13  Yes Shongaloo Berton Lan, MD  fish oil-omega-3 fatty acids 1000 MG capsule Take 1 g by mouth daily.   Yes Historical Provider, MD  levothyroxine (SYNTHROID, LEVOTHROID) 50 MCG tablet Take 50 mcg by mouth daily.   Yes Historical Provider, MD  magnesium gluconate (MAGONATE) 500 MG tablet Take 500 mg by mouth daily.   Yes Historical Provider, MD  sertraline (ZOLOFT) 100 MG tablet Take 150 mg by mouth daily.   Yes Historical Provider, MD    Family History  Family History  Problem Relation Age of Onset  . Cancer Mother   . Hypertension Mother   . Thyroid disease Mother   . Cancer Paternal Grandmother   . Breast cancer Paternal Grandmother   . Thyroid disease Sister   . Stroke Maternal Grandmother     Social History  History   Social History  . Marital Status: Married    Spouse Name: N/A    Number of Children: N/A  . Years of Education: N/A   Occupational History  . Not on file.   Social History Main Topics  . Smoking status: Never Smoker   . Smokeless tobacco: Never Used  . Alcohol Use: Yes     Comment: 7 glasses of wine per week  . Drug Use: No  . Sexual Activity: Yes    Partners: Male    Birth Control/ Protection: None, Surgical     Comment:  TVH--still has ovaries   Other Topics Concern  . Not on file   Social History Narrative  . No narrative on file     Review of Systems, as per HPI, otherwise negative General:  No chills, fever, night sweats or weight changes.  Cardiovascular:  No chest pain, dyspnea on exertion, edema, orthopnea, palpitations, paroxysmal nocturnal dyspnea. Dermatological: No rash, lesions/masses Respiratory: No cough, dyspnea Urologic: No hematuria, dysuria Abdominal:   No nausea, vomiting, diarrhea, bright red blood per rectum, melena, or hematemesis Neurologic:  No visual changes, wkns, changes in mental status. All other systems reviewed and are otherwise negative except as noted above.  Physical  Exam  Blood pressure 107/43, pulse 54, height 5\' 3"  (1.6 m), weight 118 lb 12.8 oz (53.887 kg).  General: Pleasant, NAD Psych: Normal affect. Neuro: Alert and oriented X 3. Moves all extremities spontaneously. HEENT: Normal  Neck: Supple without bruits or JVD. Lungs:  Resp regular and unlabored, CTA. Heart: RRR no s3, s4, or murmurs. Abdomen: Soft, non-tender, non-distended, BS + x 4.  Extremities: No clubbing, cyanosis or edema. DP/PT/Radials 2+ and equal bilaterally.  Labs:  No results found for this basename: CKTOTAL, CKMB, TROPONINI,  in the last 72 hours Lab Results  Component Value Date   WBC 7.2 03/27/2007   HGB 13.1 03/27/2007   HCT 37.4 03/27/2007   MCV 94.9 03/27/2007   PLT 196 03/27/2007   No results found for this basename: NA, K, CL, CO2, BUN, CREATININE, CALCIUM, LABALBU, PROT, BILITOT, ALKPHOS, ALT, AST, GLUCOSE,  in the last 168 hours No results found for this basename: CHOL, HDL, LDLCALC, TRIG   No results found for this basename: DDIMER   No components found with this basename: POCBNP,   Accessory Clinical Findings  Echocardiogram - none  ECG - sinus bradycardia with sinus arrhythmia, 56 beats per minute, otherwise normal EKG   Assessment & Plan  63 year old female  1. Paroxysmal atrial fibrillation - we will start the patient on a LifeWatch monitor for 14 days, she is instructed to bring the monitor back when she has at least 2 episodes of palpitations. We will check CBC CMP and TSH. Patient CHADS-VASc 1 (for sex). For now we'll just start her on aspirin 325 mg daily. We will order exercise treadmill stress test to indicate symptoms of palpitation date she gets after the stress test and also to rule out ischemia even though suspicion is low. We will order an echocardiogram to assess heart anatomy and to see if the patient would be a candidate for radiofrequency ablation. She is not an ideal candidate for any AV nodal nodule blocking agents as her blood pressure  and heart rate are in normal range and she developed significant dizziness taking a low-dose of metoprolol in the past.  Follow up in 1 month.  Dorothy Spark, MD, [facility] 08/28/2013, 3:55 PM

## 2013-09-08 ENCOUNTER — Encounter (INDEPENDENT_AMBULATORY_CARE_PROVIDER_SITE_OTHER): Payer: Medicare HMO

## 2013-09-08 ENCOUNTER — Encounter: Payer: Self-pay | Admitting: *Deleted

## 2013-09-08 DIAGNOSIS — I4891 Unspecified atrial fibrillation: Secondary | ICD-10-CM

## 2013-09-08 NOTE — Progress Notes (Signed)
Patient ID: Terri [name], female   DOB: Aug 29, 1950, 63 y.o.   MRN: [id number] Lifewatch 14 day cardiac event monitor applied to patient.

## 2013-09-09 ENCOUNTER — Ambulatory Visit (HOSPITAL_COMMUNITY)
Admission: RE | Admit: 2013-09-09 | Discharge: 2013-09-09 | Disposition: A | Discharge status: Home / Self Care | Payer: Medicare HMO | Source: Ambulatory Visit | Attending: Cardiology | Admitting: Cardiology

## 2013-09-09 DIAGNOSIS — R42 Dizziness and giddiness: Secondary | ICD-10-CM

## 2013-09-09 DIAGNOSIS — R0602 Shortness of breath: Secondary | ICD-10-CM

## 2013-09-09 DIAGNOSIS — R002 Palpitations: Secondary | ICD-10-CM

## 2013-09-10 ENCOUNTER — Ambulatory Visit: Payer: 59 | Admitting: Interventional Cardiology

## 2013-09-15 ENCOUNTER — Other Ambulatory Visit: Payer: 59

## 2013-09-18 ENCOUNTER — Encounter: Payer: Self-pay | Admitting: Cardiology

## 2013-09-18 ENCOUNTER — Ambulatory Visit (HOSPITAL_COMMUNITY): Payer: Medicare HMO | Attending: Cardiology | Admitting: Radiology

## 2013-09-18 DIAGNOSIS — R0602 Shortness of breath: Secondary | ICD-10-CM

## 2013-09-18 DIAGNOSIS — I079 Rheumatic tricuspid valve disease, unspecified: Secondary | ICD-10-CM | POA: Insufficient documentation

## 2013-09-18 DIAGNOSIS — I4891 Unspecified atrial fibrillation: Secondary | ICD-10-CM | POA: Insufficient documentation

## 2013-09-18 DIAGNOSIS — R002 Palpitations: Secondary | ICD-10-CM | POA: Insufficient documentation

## 2013-09-18 DIAGNOSIS — R42 Dizziness and giddiness: Secondary | ICD-10-CM | POA: Insufficient documentation

## 2013-09-18 DIAGNOSIS — R0989 Other specified symptoms and signs involving the circulatory and respiratory systems: Secondary | ICD-10-CM | POA: Insufficient documentation

## 2013-09-18 DIAGNOSIS — E785 Hyperlipidemia, unspecified: Secondary | ICD-10-CM | POA: Insufficient documentation

## 2013-09-18 DIAGNOSIS — R0609 Other forms of dyspnea: Secondary | ICD-10-CM | POA: Insufficient documentation

## 2013-09-18 NOTE — Progress Notes (Signed)
Echocardiogram performed.  

## 2013-09-19 ENCOUNTER — Telehealth: Payer: Self-pay

## 2013-09-19 ENCOUNTER — Other Ambulatory Visit: Payer: Self-pay

## 2013-09-19 NOTE — Telephone Encounter (Signed)
    Seymour Bars B More Detail >>      Dorothy Spark, MD      Sent: Wed September 10, 2013  1:50 PM      To: Patrica Via, LPN      Pt advised, she verbalized understanding.        Message      Normal stress test, would you let her know?      Thank you,      Houston Siren

## 2013-09-22 ENCOUNTER — Other Ambulatory Visit (INDEPENDENT_AMBULATORY_CARE_PROVIDER_SITE_OTHER): Payer: Medicare HMO

## 2013-09-22 DIAGNOSIS — E785 Hyperlipidemia, unspecified: Secondary | ICD-10-CM

## 2013-09-22 DIAGNOSIS — I4891 Unspecified atrial fibrillation: Secondary | ICD-10-CM

## 2013-09-22 LAB — CBC WITH DIFFERENTIAL/PLATELET
Basophils Absolute: 0 10*3/uL (ref 0.0–0.1)
Basophils Relative: 0.6 % (ref 0.0–3.0)
Eosinophils Absolute: 0.3 10*3/uL (ref 0.0–0.7)
Eosinophils Relative: 6 % — ABNORMAL HIGH (ref 0.0–5.0)
HCT: 40.3 % (ref 36.0–46.0)
Hemoglobin: 13.2 g/dL (ref 12.0–15.0)
Lymphocytes Relative: 32.8 % (ref 12.0–46.0)
Lymphs Abs: 1.9 10*3/uL (ref 0.7–4.0)
MCHC: 32.8 g/dL (ref 30.0–36.0)
MCV: 98.2 fl (ref 78.0–100.0)
Monocytes Absolute: 0.4 10*3/uL (ref 0.1–1.0)
Monocytes Relative: 7.2 % (ref 3.0–12.0)
Neutro Abs: 3.1 10*3/uL (ref 1.4–7.7)
Neutrophils Relative %: 53.4 % (ref 43.0–77.0)
Platelets: 208 10*3/uL (ref 150.0–400.0)
RBC: 4.1 Mil/uL (ref 3.87–5.11)
RDW: 13.1 % (ref 11.5–14.6)
WBC: 5.8 10*3/uL (ref 4.5–10.5)

## 2013-09-22 LAB — LDL CHOLESTEROL, DIRECT: Direct LDL: 126.3 mg/dL

## 2013-09-22 LAB — COMPREHENSIVE METABOLIC PANEL
ALT: 29 U/L (ref 0–35)
AST: 29 U/L (ref 0–37)
Albumin: 4.1 g/dL (ref 3.5–5.2)
Alkaline Phosphatase: 69 U/L (ref 39–117)
BUN: 19 mg/dL (ref 6–23)
CO2: 30 mEq/L (ref 19–32)
Calcium: 9.2 mg/dL (ref 8.4–10.5)
Chloride: 103 mEq/L (ref 96–112)
Creatinine, Ser: 0.8 mg/dL (ref 0.4–1.2)
GFR: 77.63 mL/min (ref >60.00)
Glucose, Bld: 84 mg/dL (ref 70–99)
Potassium: 3.7 mEq/L (ref 3.5–5.1)
Sodium: 139 mEq/L (ref 135–145)
Total Bilirubin: 0.5 mg/dL (ref 0.3–1.2)
Total Protein: 7 g/dL (ref 6.0–8.3)

## 2013-09-22 LAB — LIPID PANEL
Cholesterol: 250 mg/dL — ABNORMAL HIGH (ref 0–200)
HDL: 110.8 mg/dL (ref >39.00)
Total CHOL/HDL Ratio: 2
Triglycerides: 59 mg/dL (ref 0.0–149.0)
VLDL: 11.8 mg/dL (ref 0.0–40.0)

## 2013-09-22 LAB — TSH: TSH: 2.06 u[IU]/mL (ref 0.35–5.50)

## 2013-09-23 ENCOUNTER — Ambulatory Visit: Payer: Medicare HMO | Admitting: Cardiology

## 2013-09-26 ENCOUNTER — Telehealth: Payer: Self-pay | Admitting: Cardiology

## 2013-09-26 NOTE — Telephone Encounter (Signed)
**Note De-Identified Terri [name] Obfuscation** Pt given normal Echo results, she verbalized understanding.

## 2013-09-26 NOTE — Telephone Encounter (Signed)
Follow up ° ° ° ° °Returning a nurses call °

## 2013-10-09 ENCOUNTER — Ambulatory Visit: Payer: Medicare HMO | Admitting: Cardiology

## 2013-10-10 ENCOUNTER — Encounter: Payer: Self-pay | Admitting: Cardiology

## 2013-10-10 ENCOUNTER — Ambulatory Visit (INDEPENDENT_AMBULATORY_CARE_PROVIDER_SITE_OTHER): Payer: Medicare HMO | Admitting: Cardiology

## 2013-10-10 VITALS — BP 92/70 | HR 64 | Ht 63.0 in | Wt 117.0 lb

## 2013-10-10 DIAGNOSIS — R42 Dizziness and giddiness: Secondary | ICD-10-CM

## 2013-10-10 DIAGNOSIS — I480 Paroxysmal atrial fibrillation: Secondary | ICD-10-CM

## 2013-10-10 DIAGNOSIS — I4891 Unspecified atrial fibrillation: Secondary | ICD-10-CM

## 2013-10-10 DIAGNOSIS — R002 Palpitations: Secondary | ICD-10-CM

## 2013-10-10 NOTE — Patient Instructions (Signed)
Your physician recommends that you continue on your current medications as directed. Please refer to the Current Medication list given to you today.  You have been referred to [facility] for an ablation consultation

## 2013-10-10 NOTE — Progress Notes (Signed)
Patient ID: Terri [name], female   DOB: Jan 15, 1951, 63 y.o.   MRN: [id number]   Patient Name: Terri [name] Date of Encounter: 10/10/2013  Primary Care Provider:  [name], MD Primary Cardiologist:  Dorothy Spark  Problem List   Past Medical History  Diagnosis Date  . Depression   . Thyroid disease   . Dyspareunia   . Fibroid   . Arthritis     lt shoulder   Past Surgical History  Procedure Laterality Date  . Abdominal hysterectomy    . Breast enhancement surgery    . Fracture surgery Right     wrist  . Cosmetic surgery    . Augmentation mammaplasty    . Flexible sigmoidoscopy N/A 07/29/2013    Procedure: FLEXIBLE SIGMOIDOSCOPY;  Surgeon: [name], MD;  Location: [facility];  Service: Endoscopy;  Laterality: N/A;   Allergies  No Known Allergies  HPI  A very pleasant 63 year old female with no significant past medical history who has been very active throughout her life and continues exercising 5-6 times a week for about an hour daily. The patient started to have palpitations associated with shortness of breath and dizziness that start few minutes after she stops exercising and usually last for a few minutes when she has to sit down. On few occasions she felt like she's going to pass out but she didn't. The patient didn't really pay much attention to it until she went for colonoscopy to add was terminated prematurely because of a finding of atrial fibrillation with RVR during colonoscopy. Patient otherwise denies chest pain, exertional shortness of breath, orthopnea, person nocturnal dyspnea, or edema. She doesn't have a history of heart disease in her family.   Home Medications  Prior to Admission medications   Medication Sig Start Date End Date Taking? Authorizing Provider  Biotin 5000 MCG CAPS Take 1 capsule by mouth daily.   Yes Historical Provider, MD  estrogens, conjugated, (PREMARIN) 0.45 MG tablet Take 0.45 mg by mouth every other day.  04/25/13  Yes Cayce Berton Lan, MD  fish oil-omega-3 fatty acids 1000 MG capsule Take 1 g by mouth daily.   Yes Historical Provider, MD  levothyroxine (SYNTHROID, LEVOTHROID) 50 MCG tablet Take 50 mcg by mouth daily.   Yes Historical Provider, MD  magnesium gluconate (MAGONATE) 500 MG tablet Take 500 mg by mouth daily.   Yes Historical Provider, MD  sertraline (ZOLOFT) 100 MG tablet Take 150 mg by mouth daily.   Yes Historical Provider, MD    Family History  Family History  Problem Relation Age of Onset  . Cancer Mother   . Hypertension Mother   . Thyroid disease Mother   . Cancer Paternal Grandmother   . Breast cancer Paternal Grandmother   . Thyroid disease Sister   . Stroke Maternal Grandmother     Social History  History   Social History  . Marital Status: Married    Spouse Name: N/A    Number of Children: N/A  . Years of Education: N/A   Occupational History  . Not on file.   Social History Main Topics  . Smoking status: Never Smoker   . Smokeless tobacco: Never Used  . Alcohol Use: Yes     Comment: 7 glasses of wine per week  . Drug Use: No  . Sexual Activity: Yes    Partners: Male    Birth Control/ Protection: None, Surgical     Comment: TVH--still has ovaries  Other Topics Concern  . Not on file   Social History Narrative  . No narrative on file     Review of Systems, as per HPI, otherwise negative General:  No chills, fever, night sweats or weight changes.  Cardiovascular:  No chest pain, dyspnea on exertion, edema, orthopnea, palpitations, paroxysmal nocturnal dyspnea. Dermatological: No rash, lesions/masses Respiratory: No cough, dyspnea Urologic: No hematuria, dysuria Abdominal:   No nausea, vomiting, diarrhea, bright red blood per rectum, melena, or hematemesis Neurologic:  No visual changes, wkns, changes in mental status. All other systems reviewed and are otherwise negative except as noted above.  Physical Exam  Blood  pressure 92/70, pulse 64, height 5\' 3"  (1.6 m), weight 117 lb (53.071 kg).  General: Pleasant, NAD Psych: Normal affect. Neuro: Alert and oriented X 3. Moves all extremities spontaneously. HEENT: Normal  Neck: Supple without bruits or JVD. Lungs:  Resp regular and unlabored, CTA. Heart: RRR no s3, s4, or murmurs. Abdomen: Soft, non-tender, non-distended, BS + x 4.  Extremities: No clubbing, cyanosis or edema. DP/PT/Radials 2+ and equal bilaterally.  Labs:  No results found for this basename: CKTOTAL, CKMB, TROPONINI,  in the last 72 hours Lab Results  Component Value Date   WBC 5.8 09/22/2013   HGB 13.2 09/22/2013   HCT 40.3 09/22/2013   MCV 98.2 09/22/2013   PLT 208.0 09/22/2013   No results found for this basename: NA, K, CL, CO2, BUN, CREATININE, CALCIUM, LABALBU, PROT, BILITOT, ALKPHOS, ALT, AST, GLUCOSE,  in the last 168 hours Lab Results  Component Value Date   CHOL 250* 09/22/2013   No results found for this basename: DDIMER   No components found with this basename: POCBNP,   Accessory Clinical Findings  Echocardiogram - 09/18/2013 Study Conclusions  - Left ventricle: The cavity size was normal. Systolic function was normal. The estimated ejection fraction was in the range of 55% to 60%. Wall motion was normal; there were no regional wall motion abnormalities. - Aortic valve: Trivial regurgitation. - Right atrium: The atrium was mildly dilated. - Pericardium, extracardiac: A small, free-flowing pericardial effusion was identified circumferential to the heart. The fluid had no internal echoes.There was no evidence of hemodynamic compromise.  ECG - sinus bradycardia with sinus arrhythmia, 56 beats per minute, otherwise normal EKG   Assessment & Plan  63 year old female  1. Paroxysmal atrial fibrillation - 14 day LifeWatch monitor showed multiple (at least 10) episodes of paroxysmal atrial fibrillation with HR up to 170 BPM. She is very symptomatic, especially post  exercise with multiple pre-syncopal episodes.  Exercise treadmill stress test showed excellent exercise capacity (exercised for 13 minutes and achieved 15 METS) and no ischemia. No arrhythmias during stress testing. Her echocardiogram showed normal LVEF, no MR and normal size left atrium.  She appears an ideal candidate for an ablation and we will refer her to Dr Rayann Heman for a consultation.   She won't tolerate AVN agents as she is hypotensive and bradycardic at baseline and she developed significant dizziness taking a low-dose of metoprolol in the past. We will await further recommendation about invasive/medical therapy from Dr. Rayann Heman.  All labs including TSH were normal. Patient CHADS-VASc 1 (for sex). We will continue Aspirin 325 for now.   Follow up as needed.  Dorothy Spark, MD, [facility] 10/10/2013, 11:48 AM

## 2013-10-16 ENCOUNTER — Telehealth: Payer: Self-pay | Admitting: Obstetrics and Gynecology

## 2013-10-16 NOTE — Telephone Encounter (Signed)
Patient recently switched insurance to [facility] medicare replacement plan. Her medication is not covered by this plan and wants to know what she needs to do to get it switched to one that is covered estrogens, conjugated, (PREMARIN) 0.45 MG tablet  Take 0.45 mg by mouth every other day. 1/2 a pill everyday, Last Dose: Not Recorded

## 2013-10-17 ENCOUNTER — Other Ambulatory Visit: Payer: Self-pay

## 2013-10-17 DIAGNOSIS — Z1231 Encounter for screening mammogram for malignant neoplasm of breast: Secondary | ICD-10-CM

## 2013-10-17 NOTE — Telephone Encounter (Signed)
Dr. Quincy Simmonds, can patient take 1/2 every other day to wean off slowly or does she need to stop all Premarin right away?

## 2013-10-17 NOTE — Telephone Encounter (Signed)
Patient recently changed to [facility] and premarin price is increasing. She wanted to know if Dr. Quincy Simmonds would be agreeable to starting her on Estradiol as this will be the most affordable option. Patient tried to wean off Premarin  with 1 tablet every other day and was uncomfortable. She is taking a 1/2 a 0.45 mg Premarin Daily at this time and feeling better.   Dr. Quincy Simmonds, patient is now using a mail order pharmacy and we are unable to e-scribe. If you place new rx, I will need it to fax.    Nurses notes: To initiate prior authorization: Call [phone]  [facility] pharmacy: [phone] Fax [phone]

## 2013-10-17 NOTE — Telephone Encounter (Signed)
I recommend stopping completely with all hormones. If we need to treat the hot flashes, we can use nonhormonal medication - herbs or Effexor or Brisdelle.

## 2013-10-17 NOTE — Telephone Encounter (Signed)
I see the patient has a new diagnosis of atrial fibrillation and is undergoing evaluation and treatment. If this is the case, I do not recommend continuation of the estrogen at this time, as this will increase her risk of blood clots.  Soy and black cohosh will be good herbal products for her to consider for her symptoms.  I will be happy to have her come to the office to discuss further.

## 2013-10-20 NOTE — Telephone Encounter (Signed)
Message left to return call to Yandriel Boening at [phone].    

## 2013-10-22 NOTE — Telephone Encounter (Signed)
Late entry for 10/20/13: Spoke with patient and she was advised of message from Dr. Quincy Simmonds. Patient is agreeable and will stop all hormones at this time. She will call back with any symptoms for office visit with Dr. Quincy Simmonds.   Routing to provider for final review. Patient agreeable to disposition. Will close encounter

## 2013-11-03 ENCOUNTER — Ambulatory Visit (INDEPENDENT_AMBULATORY_CARE_PROVIDER_SITE_OTHER): Payer: Commercial Managed Care - HMO | Admitting: Internal Medicine

## 2013-11-03 ENCOUNTER — Encounter: Payer: Self-pay | Admitting: Internal Medicine

## 2013-11-03 VITALS — BP 105/64 | HR 54 | Ht 63.0 in | Wt 115.0 lb

## 2013-11-03 DIAGNOSIS — I4892 Unspecified atrial flutter: Secondary | ICD-10-CM

## 2013-11-03 DIAGNOSIS — I480 Paroxysmal atrial fibrillation: Secondary | ICD-10-CM

## 2013-11-03 DIAGNOSIS — I4891 Unspecified atrial fibrillation: Secondary | ICD-10-CM

## 2013-11-03 MED ORDER — RIVAROXABAN 20 MG PO TABS: 20.0000 mg | ORAL_TABLET | Freq: Every day | ORAL | Status: DC

## 2013-11-03 NOTE — Patient Instructions (Addendum)
Your physician recommends that you schedule a follow-up appointment after ablation---will schedule at hospital  Your physician has recommended that you have an ablation. Catheter ablation is a medical procedure used to treat some cardiac arrhythmias (irregular heartbeats). During catheter ablation, a long, thin, flexible tube is put into a blood vessel in your groin (upper thigh), or neck. This tube is called an ablation catheter. It is then guided to your heart through the blood vessel. Radio frequency waves destroy small areas of heart tissue where abnormal heartbeats may cause an arrhythmia to start. Please see the instruction sheet given to you today.----12/24/13 at 7:30am  Arrive at hospital at 5:30am  TEE---12/23/13---will call you with time.  Will need someone to drive you home after procedure  Your physician recommends that you return for lab work in: 12/16/13       Your physician has recommended you make the following change in your medication:  1) Stop Aspirin 2) Start Xarelto 20mg  daily

## 2013-11-03 NOTE — Progress Notes (Signed)
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 female with a h/o recently diagnosed atrial fibrillation who presents for EP consultation.  She reports that she underwent colonoscopy 12/14 and was found to have afib during the study.  She was referred to Terri [name] who placed an event monitor.  This documented frequent afib as well as atrial flutter with RVR.  She was originally placed on metoprolol but she did not tolerate this due to hypotension.  She reports symptoms of tachypalpitations and dizziness with afib.  She has occasional nausea.  She has some fatigue and decreased exercise tolerance with her afib.  She finds that she will have episodes an hour or so after working out and also at times when she eats.  Occasionally episodes occur without warning.  She exercises regularly without symptoms of ischemia.  Episodes occur most days, typically lasting only several minutes.  Today, she denies symptoms of chest pain,  orthopnea, PND, lower extremity edema,  presyncope, syncope, or neurologic sequela. The patient is tolerating medications without difficulties and is otherwise without complaint today.   Past Medical History  Diagnosis Date  . Depression   . Hypothyroidism   . Dyspareunia   . Fibroid   . Arthritis     lt shoulder  . Paroxysmal atrial fibrillation   . Atrial flutter    Past Surgical History  Procedure Laterality Date  . Abdominal hysterectomy    . Breast enhancement surgery    . Fracture surgery Right     wrist  . Cosmetic surgery    . Augmentation mammaplasty    . Flexible sigmoidoscopy N/A 07/29/2013    Procedure: FLEXIBLE SIGMOIDOSCOPY;  Surgeon: Terri Fair, MD;  Location: [facility];  Service: Endoscopy;  Laterality: N/A;    Current Outpatient Prescriptions  Medication Sig Dispense Refill  . aspirin EC 325 MG tablet Take 1 tablet (325 mg total) by mouth daily.  30 tablet  0  . Biotin 5000 MCG  CAPS Take 1 capsule by mouth daily.      . Calcium Carbonate-Vit D-Min (CALCIUM 1200 PO) Take 600 mg twice a day      . fish oil-omega-3 fatty acids 1000 MG capsule Take 1 g by mouth daily.      Marland Kitchen levothyroxine (SYNTHROID, LEVOTHROID) 50 MCG tablet Take 50 mcg by mouth daily.      . sertraline (ZOLOFT) 100 MG tablet Take 150 mg by mouth daily.       No current facility-administered medications for this visit.    No Known Allergies  History   Social History  . Marital Status: Married    Spouse Name: N/A    Number of Children: N/A  . Years of Education: N/A   Occupational History  . Not on file.   Social History Main Topics  . Smoking status: Never Smoker   . Smokeless tobacco: Never Used  . Alcohol Use: Yes     Comment: 7 glasses of wine per week  . Drug Use: No  . Sexual Activity: Yes    Partners: Male    Birth Control/ Protection: None, Surgical     Comment: TVH--still has ovaries   Other Topics Concern  . Not on file   Social History Narrative   Pt lives in [place] with spouse.  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    Family History  Problem Relation Age of Onset  . Cancer Mother   .  Hypertension Mother   . Thyroid disease Mother   . Cancer Paternal Grandmother   . Breast cancer Paternal Grandmother   . Thyroid disease Sister   . Stroke Maternal Grandmother     ROS- All systems are reviewed and negative except as per the HPI above  Physical Exam: Filed Vitals:   11/03/13 1516  BP: 105/64  Pulse: 54  Height: 5\' 3"  (1.6 m)  Weight: 115 lb (52.164 kg)    GEN- The patient is well appearing, alert and oriented x 3 today.   Head- normocephalic, atraumatic Eyes-  Sclera clear, conjunctiva pink Ears- hearing intact Oropharynx- clear Neck- supple, no JVP Lymph- no cervical lymphadenopathy Lungs- Clear to ausculation bilaterally, normal work of breathing Heart- Regular rate and rhythm, no murmurs, rubs or gallops, PMI not laterally displaced GI-  soft, NT, ND, + BS Extremities- no clubbing, cyanosis, or edema MS- no significant deformity or atrophy Skin- no rash or lesion Psych- euthymic mood, full affect Neuro- strength and sensation are intact  EKG today reveals sinus rhythm 54 bpm, septal infarct, otherwise normal ekg Event monitor is reviewd and reveals both afib and atrial flutter with RVR.  Her rates is sinus are slow at times (50s). Terri [name] note is reviewed Echo 09/18/13- EF 55-60, LA size 26mm, no significant valvular dysfunction  Assessment and Plan:   1. Atrial fibrillation/ atrial flutter The patient has symptomatic paroxysmal atrial arrhythmias.  She has failed medical therapy with beta blockers.  Given chronic hypotension and bradycardia, her AAD options are limited.  Today,  I did discuss multaq and tikosyn as options.  She says that she has not tolerated medicines recently and would prefer ablation.  Therapeutic strategies for atrial arrhythmias including medicine and ablation were discussed in detail with the patient today. Risk, benefits, and alternatives to EP study and radiofrequency ablation for afib were also discussed in detail today. These risks include but are not limited to stroke, bleeding, vascular damage, tamponade, perforation, damage to the esophagus, lungs, and other structures, pulmonary vein stenosis, worsening renal function, and death. The patient understands these risk and wishes to proceed.  We will therefore proceed with catheter ablation once the patient has been adequately anticoagulated.  I will stop ASA today and start xarelto 20mg  daily.

## 2013-11-05 DIAGNOSIS — I4892 Unspecified atrial flutter: Secondary | ICD-10-CM | POA: Insufficient documentation

## 2013-11-13 ENCOUNTER — Other Ambulatory Visit: Payer: Self-pay | Admitting: *Deleted

## 2013-11-13 DIAGNOSIS — I4891 Unspecified atrial fibrillation: Secondary | ICD-10-CM

## 2013-11-20 ENCOUNTER — Telehealth: Payer: Self-pay | Admitting: Internal Medicine

## 2013-11-20 ENCOUNTER — Encounter: Payer: Self-pay | Admitting: *Deleted

## 2013-11-20 NOTE — Telephone Encounter (Signed)
I spoke with the patient. She states she was mainly calling to see if a referral had been received from her PCP for her procedure with Dr. Rayann Heman. She states that due to her insurance, she has to have a referral for everything. I will forward this message to pre-cert in this regard. The patient is aware that she is scheduled for her a-fib ablation on 5/19 at 7:30 am with Dr. Rayann Heman. She is scheduled for a TEE to be done on 5/18 at 10:00 am with Dr. Johnsie Cancel. I will mail her a letter of instructions.

## 2013-11-20 NOTE — Telephone Encounter (Signed)
New message   Patient calling stating her procedure with Dr. Rayann Heman is 5/20. Please advise.

## 2013-11-24 ENCOUNTER — Ambulatory Visit
Admission: RE | Admit: 2013-11-24 | Discharge: 2013-11-24 | Disposition: A | Discharge status: Home / Self Care | Payer: Commercial Managed Care - HMO | Source: Ambulatory Visit

## 2013-11-24 DIAGNOSIS — Z1231 Encounter for screening mammogram for malignant neoplasm of breast: Secondary | ICD-10-CM

## 2013-11-26 NOTE — Telephone Encounter (Signed)
Left voice mail message for patient advising her we have submitted paperwork to [facility] for TEE & ablation (scheduled in May).  Also advised her that we do not (at this time) need a referral from her PCP but that will probably change in May & that she can contact them for that.

## 2013-11-27 ENCOUNTER — Other Ambulatory Visit: Payer: Self-pay | Admitting: *Deleted

## 2013-11-27 DIAGNOSIS — I4891 Unspecified atrial fibrillation: Secondary | ICD-10-CM

## 2013-12-16 ENCOUNTER — Other Ambulatory Visit (INDEPENDENT_AMBULATORY_CARE_PROVIDER_SITE_OTHER): Payer: Medicare HMO

## 2013-12-16 DIAGNOSIS — I4891 Unspecified atrial fibrillation: Secondary | ICD-10-CM

## 2013-12-16 LAB — CBC WITH DIFFERENTIAL/PLATELET
Basophils Absolute: 0 10*3/uL (ref 0.0–0.1)
Basophils Relative: 0.6 % (ref 0.0–3.0)
Eosinophils Absolute: 0.1 10*3/uL (ref 0.0–0.7)
Eosinophils Relative: 1.8 % (ref 0.0–5.0)
HCT: 37.5 % (ref 36.0–46.0)
Hemoglobin: 12.7 g/dL (ref 12.0–15.0)
Lymphocytes Relative: 31.8 % (ref 12.0–46.0)
Lymphs Abs: 1.8 10*3/uL (ref 0.7–4.0)
MCHC: 33.8 g/dL (ref 30.0–36.0)
MCV: 96.6 fl (ref 78.0–100.0)
MONO ABS: 0.4 10*3/uL (ref 0.1–1.0)
Monocytes Relative: 7.2 % (ref 3.0–12.0)
NEUTROS PCT: 58.6 % (ref 43.0–77.0)
Neutro Abs: 3.3 10*3/uL (ref 1.4–7.7)
PLATELETS: 191 10*3/uL (ref 150.0–400.0)
RBC: 3.89 Mil/uL (ref 3.87–5.11)
RDW: 13.8 % (ref 11.5–15.5)
WBC: 5.7 10*3/uL (ref 4.0–10.5)

## 2013-12-16 LAB — BASIC METABOLIC PANEL
BUN: 15 mg/dL (ref 6–23)
CO2: 28 mEq/L (ref 19–32)
CREATININE: 0.7 mg/dL (ref 0.4–1.2)
Calcium: 9 mg/dL (ref 8.4–10.5)
Chloride: 100 mEq/L (ref 96–112)
GFR: 92.23 mL/min (ref >60.00)
Glucose, Bld: 90 mg/dL (ref 70–99)
Potassium: 3.7 mEq/L (ref 3.5–5.1)
Sodium: 134 mEq/L — ABNORMAL LOW (ref 135–145)

## 2013-12-17 ENCOUNTER — Encounter (HOSPITAL_COMMUNITY): Payer: Self-pay | Admitting: Pharmacy Technician

## 2013-12-18 NOTE — Telephone Encounter (Signed)
New Message  Pt called to discuss the protocol that comes with preparing for a TEE// Please call back to discuss.

## 2013-12-18 NOTE — Telephone Encounter (Signed)
Lmom for patient with instructions for her TEE and to call me back if needed

## 2013-12-22 ENCOUNTER — Encounter (HOSPITAL_COMMUNITY)
Admission: RE | Disposition: A | Discharge status: Home / Self Care | Payer: Self-pay | Source: Ambulatory Visit | Attending: Cardiovascular Disease

## 2013-12-22 ENCOUNTER — Ambulatory Visit (HOSPITAL_COMMUNITY)
Admission: RE | Admit: 2013-12-22 | Discharge: 2013-12-22 | Disposition: A | Discharge status: Home / Self Care | Payer: Medicare HMO | Source: Ambulatory Visit | Attending: Cardiovascular Disease | Admitting: Cardiovascular Disease

## 2013-12-22 ENCOUNTER — Encounter (HOSPITAL_COMMUNITY): Payer: Self-pay

## 2013-12-22 DIAGNOSIS — I359 Nonrheumatic aortic valve disorder, unspecified: Secondary | ICD-10-CM | POA: Diagnosis not present

## 2013-12-22 DIAGNOSIS — F329 Major depressive disorder, single episode, unspecified: Secondary | ICD-10-CM | POA: Insufficient documentation

## 2013-12-22 DIAGNOSIS — F3289 Other specified depressive episodes: Secondary | ICD-10-CM | POA: Insufficient documentation

## 2013-12-22 DIAGNOSIS — I4892 Unspecified atrial flutter: Secondary | ICD-10-CM | POA: Insufficient documentation

## 2013-12-22 DIAGNOSIS — E039 Hypothyroidism, unspecified: Secondary | ICD-10-CM | POA: Insufficient documentation

## 2013-12-22 DIAGNOSIS — I4891 Unspecified atrial fibrillation: Secondary | ICD-10-CM | POA: Insufficient documentation

## 2013-12-22 HISTORY — PX: TEE WITHOUT CARDIOVERSION: SHX5443

## 2013-12-22 SURGERY — ECHOCARDIOGRAM, TRANSESOPHAGEAL
Anesthesia: Moderate Sedation

## 2013-12-22 MED ORDER — SODIUM CHLORIDE 0.9 % IV SOLN
INTRAVENOUS | Status: DC
  Administered 2013-12-22: 09:00:00 via INTRAVENOUS

## 2013-12-22 MED ORDER — MIDAZOLAM HCL 10 MG/2ML IJ SOLN
INTRAMUSCULAR | Status: DC | PRN
  Administered 2013-12-22 (×2): 2 mg via INTRAVENOUS

## 2013-12-22 MED ORDER — FENTANYL CITRATE 0.05 MG/ML IJ SOLN
INTRAMUSCULAR | Status: AC
  Filled 2013-12-22: qty 2

## 2013-12-22 MED ORDER — SODIUM CHLORIDE 0.9 % IV SOLN: INTRAVENOUS | Status: DC

## 2013-12-22 MED ORDER — MIDAZOLAM HCL 5 MG/ML IJ SOLN
INTRAMUSCULAR | Status: AC
  Filled 2013-12-22: qty 2

## 2013-12-22 MED ORDER — BUTAMBEN-TETRACAINE-BENZOCAINE 2-2-14 % EX AERO
INHALATION_SPRAY | CUTANEOUS | Status: DC | PRN
  Administered 2013-12-22: 2 via TOPICAL

## 2013-12-22 MED ORDER — FENTANYL CITRATE 0.05 MG/ML IJ SOLN
INTRAMUSCULAR | Status: DC | PRN
  Administered 2013-12-22 (×2): 25 ug via INTRAVENOUS

## 2013-12-22 NOTE — H&P (Signed)
Primary Care Physician: [name], MD  Referring Physician: [name] is a 63 y.o. female with a h/o recently diagnosed atrial fibrillation who presents for EP consultation. She reports that she underwent colonoscopy 12/14 and was found to have afib during the study. She was referred to Dr Meda Coffee who placed an event monitor. This documented frequent afib as well as atrial flutter with RVR. She was originally placed on metoprolol but she did not tolerate this due to hypotension. She reports symptoms of tachypalpitations and dizziness with afib. She has occasional nausea. She has some fatigue and decreased exercise tolerance with her afib. She finds that she will have episodes an hour or so after working out and also at times when she eats. Occasionally episodes occur without warning. She exercises regularly without symptoms of ischemia. Episodes occur most days, typically lasting only several minutes.  Today, she denies symptoms of chest pain, orthopnea, PND, lower extremity edema, presyncope, syncope, or neurologic sequela. The patient is tolerating medications without difficulties and is otherwise without complaint today.  Past Medical History   Diagnosis  Date   .  Depression    .  Hypothyroidism    .  Dyspareunia    .  Fibroid    .  Arthritis      lt shoulder   .  Paroxysmal atrial fibrillation    .  Atrial flutter     Past Surgical History   Procedure  Laterality  Date   .  Abdominal hysterectomy     .  Breast enhancement surgery     .  Fracture surgery  Right      wrist   .  Cosmetic surgery     .  Augmentation mammaplasty     .  Flexible sigmoidoscopy  N/A  07/29/2013     Procedure: FLEXIBLE SIGMOIDOSCOPY; Surgeon: [name], MD; Location: [facility]; Service: Endoscopy; Laterality: N/A;    Current Outpatient Prescriptions   Medication  Sig  Dispense  Refill   .  aspirin EC 325 MG tablet  Take 1 tablet (325 mg total) by mouth daily.  30 tablet   0   .  Biotin 5000 MCG CAPS  Take 1 capsule by mouth daily.     .  Calcium Carbonate-Vit D-Min (CALCIUM 1200 PO)  Take 600 mg twice a day     .  fish oil-omega-3 fatty acids 1000 MG capsule  Take 1 g by mouth daily.     Marland Kitchen  levothyroxine (SYNTHROID, LEVOTHROID) 50 MCG tablet  Take 50 mcg by mouth daily.     .  sertraline (ZOLOFT) 100 MG tablet  Take 150 mg by mouth daily.      No current facility-administered medications for this visit.   No Known Allergies  History    Social History   .  Marital Status:  Married     Spouse Name:  N/A     Number of Children:  N/A   .  Years of Education:  N/A    Occupational History   .  Not on file.    Social History Main Topics   .  Smoking status:  Never Smoker   .  Smokeless tobacco:  Never Used   .  Alcohol Use:  Yes      Comment: 7 glasses of wine per week   .  Drug Use:  No   .  Sexual Activity:  Yes     Partners:  Male     Birth Control/ Protection:  None, Surgical      Comment: TVH--still has ovaries    Other Topics  Concern   .  Not on file    Social History Narrative    Pt lives in [place] with spouse. 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    Family History   Problem  Relation  Age of Onset   .  Cancer  Mother    .  Hypertension  Mother    .  Thyroid disease  Mother    .  Cancer  Paternal Grandmother    .  Breast cancer  Paternal Grandmother    .  Thyroid disease  Sister    .  Stroke  Maternal Grandmother    ROS- All systems are reviewed and negative except as per the HPI above  Physical Exam:  Filed Vitals:    11/03/13 1516   BP:  105/64   Pulse:  54   Height:  5\' 3"  (1.6 m)   Weight:  115 lb (52.164 kg)   GEN- The patient is well appearing, alert and oriented x 3 today.  Head- normocephalic, atraumatic  Eyes- Sclera clear, conjunctiva pink  Ears- hearing intact  Oropharynx- clear  Neck- supple, no JVP  Lymph- no cervical lymphadenopathy  Lungs- Clear to ausculation bilaterally, normal work of breathing    Heart- Regular rate and rhythm, no murmurs, rubs or gallops, PMI not laterally displaced  GI- soft, NT, ND, + BS  Extremities- no clubbing, cyanosis, or edema  MS- no significant deformity or atrophy  Skin- no rash or lesion  Psych- euthymic mood, full affect  Neuro- strength and sensation are intact  EKG today reveals sinus rhythm 54 bpm, septal infarct, otherwise normal ekg  Event monitor is reviewd and reveals both afib and atrial flutter with RVR. Her rates is sinus are slow at times (50s).  Dr Thera Flake note is reviewed  Echo 09/18/13- EF 55-60, LA size 42mm, no significant valvular dysfunction  Assessment and Plan:  1. Atrial fibrillation/ atrial flutter  The patient has symptomatic paroxysmal atrial arrhythmias. She has failed medical therapy with beta blockers. Given chronic hypotension and bradycardia, her AAD options are limited. Today, I did discuss multaq and tikosyn as options. She says that she has not tolerated medicines recently and would prefer ablation.  Therapeutic strategies for atrial arrhythmias including medicine and ablation were discussed in detail with the patient today. Risk, benefits, and alternatives to EP study and radiofrequency ablation for afib were also discussed in detail today. These risks include but are not limited to stroke, bleeding, vascular damage, tamponade, perforation, damage to the esophagus, lungs, and other structures, pulmonary vein stenosis, worsening renal function, and death. The patient understands these risk and wishes to proceed. We will therefore proceed with catheter ablation once the patient has been adequately anticoagulated. I will stop ASA today and start xarelto 20mg  daily.  TEE ordered pre ablation.  [name]

## 2013-12-22 NOTE — Progress Notes (Signed)
  Echocardiogram Echocardiogram Transesophageal has been performed.  Katey Barrie Orlean Patten 12/22/2013, 11:35 AM

## 2013-12-22 NOTE — CV Procedure (Signed)
TEE:  Indication pre Ablation  50ug fentanyl and 4 mg versed   Normal EF 55% Mild LAE Trivial MR No significant pericardial effusion No LAA thrombus No PFO Normal AV Mild TR Normal aorta with no debris  Terri [name]

## 2013-12-22 NOTE — Discharge Instructions (Signed)
Transesophageal Echocardiography Transesophageal echocardiography (TEE) is a picture test of your heart using sound waves. The pictures taken can give very detailed pictures of your heart. This can help your doctor see if there are problems with your heart. TEE can check:  If your heart has blood clots in it.  How well your heart valves are working.  If you have an infection on the inside of your heart.  Some of the major arteries of your heart.  If your heart valve is working after a Office manager.  Your heart before a procedure that uses a shock to your heart to get the rhythm back to normal. BEFORE THE PROCEDURE  Do not eat or drink for 6 hours before the procedure or as told by your doctor.  Make plans to have someone drive you home after the procedure. Do not drive yourself home.  An IV tube will be put in your arm. PROCEDURE  You will be given a medicine to help you relax (sedative). It will be given through the IV tube.  A numbing medicine will be sprayed in the back of your throat to help numb it.  The tip of the probe is placed into the back of your mouth. You will be asked to swallow. This helps to pass the probe into your esophagus.  Once the tip of the probe is in the right place, your doctor can take pictures of your heart.  You may feel pressure at the back of your throat. AFTER THE PROCEDURE  You will be taken to a recovery area so the sedative can wear off.  Your throat may be sore and scratchy. This will go away slowly over time.  You will go home when you are fully awake and able to swallow liquids.  You should have someone stay with you for the next 24 hours. Document Released: 05/21/2009 Document Revised: 05/14/2013 Document Reviewed: 01/23/2013 [facility] Patient Information 2014 [place], [place]. Conscious Sedation, Adult, Care After Refer to this sheet in the next few weeks. These instructions provide you with information on caring for yourself after your  procedure. Your health care provider may also give you more specific instructions. Your treatment has been planned according to current medical practices, but problems sometimes occur. Call your health care provider if you have any problems or questions after your procedure. WHAT TO EXPECT AFTER THE PROCEDURE  After your procedure:  You may feel sleepy, clumsy, and have poor balance for several hours.  Vomiting may occur if you eat too soon after the procedure. HOME CARE INSTRUCTIONS  Do not participate in any activities where you could become injured for at least 24 hours. Do not:  Drive.  Swim.  Ride a bicycle.  Operate heavy machinery.  Cook.  Use power tools.  Climb ladders.  Work from a high place.  Do not make important decisions or sign legal documents until you are improved.  If you vomit, drink water, juice, or soup when you can drink without vomiting. Make sure you have little or no nausea before eating solid foods.  Only take over-the-counter or prescription medicines for pain, discomfort, or fever as directed by your health care provider.  Make sure you and your family fully understand everything about the medicines given to you, including what side effects may occur.  You should not drink alcohol, take sleeping pills, or take medicines that cause drowsiness for at least 24 hours.  If you smoke, do not smoke without supervision.  If you are feeling  better, you may resume normal activities 24 hours after you were sedated. °· Keep all appointments with your health care provider. °SEEK MEDICAL CARE IF: °· Your skin is pale or bluish in color. °· You continue to feel nauseous or vomit. °· Your pain is getting worse and is not helped by medicine. °· You have bleeding or swelling. °· You are still sleepy or feeling clumsy after 24 hours. °SEEK IMMEDIATE MEDICAL CARE IF: °· You develop a rash. °· You have difficulty breathing. °· You develop any type of allergic  problem. °· You have a fever. °MAKE SURE YOU: °· Understand these instructions. °· Will watch your condition. °· Will get help right away if you are not doing well or get worse. °Document Released: 05/14/2013 Document Reviewed: 02/28/2013 °[employer]® Patient Information ©2014 [employer]. ° °

## 2013-12-23 ENCOUNTER — Encounter (HOSPITAL_COMMUNITY): Payer: Medicare HMO | Admitting: Anesthesiology

## 2013-12-23 ENCOUNTER — Ambulatory Visit (HOSPITAL_COMMUNITY): Payer: Medicare HMO | Admitting: Anesthesiology

## 2013-12-23 ENCOUNTER — Encounter (HOSPITAL_COMMUNITY): Payer: Self-pay | Admitting: Anesthesiology

## 2013-12-23 ENCOUNTER — Ambulatory Visit (HOSPITAL_COMMUNITY)
Admission: RE | Admit: 2013-12-23 | Discharge: 2013-12-24 | Disposition: A | Discharge status: Home / Self Care | Payer: Medicare HMO | Source: Ambulatory Visit | Attending: Internal Medicine | Admitting: Internal Medicine

## 2013-12-23 ENCOUNTER — Encounter (HOSPITAL_COMMUNITY)
Admission: RE | Disposition: A | Discharge status: Home / Self Care | Payer: Self-pay | Source: Ambulatory Visit | Attending: Internal Medicine

## 2013-12-23 DIAGNOSIS — I4892 Unspecified atrial flutter: Secondary | ICD-10-CM | POA: Insufficient documentation

## 2013-12-23 DIAGNOSIS — I4891 Unspecified atrial fibrillation: Secondary | ICD-10-CM | POA: Diagnosis present

## 2013-12-23 DIAGNOSIS — I959 Hypotension, unspecified: Secondary | ICD-10-CM | POA: Insufficient documentation

## 2013-12-23 DIAGNOSIS — M129 Arthropathy, unspecified: Secondary | ICD-10-CM | POA: Diagnosis not present

## 2013-12-23 DIAGNOSIS — F329 Major depressive disorder, single episode, unspecified: Secondary | ICD-10-CM | POA: Diagnosis not present

## 2013-12-23 DIAGNOSIS — F3289 Other specified depressive episodes: Secondary | ICD-10-CM | POA: Diagnosis not present

## 2013-12-23 DIAGNOSIS — I480 Paroxysmal atrial fibrillation: Secondary | ICD-10-CM | POA: Diagnosis present

## 2013-12-23 DIAGNOSIS — E039 Hypothyroidism, unspecified: Secondary | ICD-10-CM | POA: Insufficient documentation

## 2013-12-23 DIAGNOSIS — I498 Other specified cardiac arrhythmias: Secondary | ICD-10-CM | POA: Insufficient documentation

## 2013-12-23 HISTORY — PX: ATRIAL FIBRILLATION ABLATION: SHX5456

## 2013-12-23 HISTORY — PX: ABLATION: SHX5711

## 2013-12-23 LAB — POCT ACTIVATED CLOTTING TIME
ACTIVATED CLOTTING TIME: 337 s
Activated Clotting Time: 310 seconds
Activated Clotting Time: 127 seconds

## 2013-12-23 LAB — MRSA PCR SCREENING: MRSA BY PCR: NEGATIVE

## 2013-12-23 SURGERY — ATRIAL FIBRILLATION ABLATION
Anesthesia: Monitor Anesthesia Care

## 2013-12-23 MED ORDER — BUPIVACAINE HCL (PF) 0.25 % IJ SOLN
INTRAMUSCULAR | Status: AC
  Filled 2013-12-23: qty 30

## 2013-12-23 MED ORDER — SODIUM CHLORIDE 0.9 % IV SOLN
INTRAVENOUS | Status: DC | PRN
  Administered 2013-12-23: 07:00:00 via INTRAVENOUS

## 2013-12-23 MED ORDER — PROPOFOL INFUSION 10 MG/ML OPTIME
INTRAVENOUS | Status: DC | PRN
  Administered 2013-12-23: 100 ug/kg/min via INTRAVENOUS

## 2013-12-23 MED ORDER — HYDROCODONE-ACETAMINOPHEN 5-325 MG PO TABS
ORAL_TABLET | ORAL | Status: AC
  Filled 2013-12-23: qty 1

## 2013-12-23 MED ORDER — RIVAROXABAN 20 MG PO TABS
20.0000 mg | ORAL_TABLET | Freq: Every day | ORAL | Status: DC
  Administered 2013-12-23: 20 mg via ORAL
  Filled 2013-12-23 (×2): qty 1

## 2013-12-23 MED ORDER — SODIUM CHLORIDE 0.9 % IJ SOLN: 3.0000 mL | INTRAMUSCULAR | Status: DC | PRN

## 2013-12-23 MED ORDER — ONDANSETRON HCL 4 MG/2ML IJ SOLN
INTRAMUSCULAR | Status: DC | PRN
  Administered 2013-12-23: 4 mg via INTRAVENOUS

## 2013-12-23 MED ORDER — MIDAZOLAM HCL 5 MG/5ML IJ SOLN
INTRAMUSCULAR | Status: DC | PRN
Start: 2013-12-23 — End: 2013-12-23
  Administered 2013-12-23 (×2): 1 mg via INTRAVENOUS

## 2013-12-23 MED ORDER — SERTRALINE HCL 50 MG PO TABS
150.0000 mg | ORAL_TABLET | Freq: Every day | ORAL | Status: DC
  Administered 2013-12-23: 150 mg via ORAL
  Filled 2013-12-23 (×2): qty 1

## 2013-12-23 MED ORDER — LEVOTHYROXINE SODIUM 50 MCG PO TABS
50.0000 ug | ORAL_TABLET | Freq: Every day | ORAL | Status: DC
  Filled 2013-12-23 (×2): qty 1

## 2013-12-23 MED ORDER — FENTANYL CITRATE 0.05 MG/ML IJ SOLN
INTRAMUSCULAR | Status: DC | PRN
  Administered 2013-12-23 (×6): 25 ug via INTRAVENOUS
  Administered 2013-12-23: 50 ug via INTRAVENOUS
  Administered 2013-12-23 (×2): 25 ug via INTRAVENOUS

## 2013-12-23 MED ORDER — SODIUM CHLORIDE 0.9 % IV SOLN: 250.0000 mL | INTRAVENOUS | Status: DC | PRN

## 2013-12-23 MED ORDER — SODIUM CHLORIDE 0.9 % IJ SOLN
3.0000 mL | Freq: Two times a day (BID) | INTRAMUSCULAR | Status: DC
  Administered 2013-12-23 (×2): 3 mL via INTRAVENOUS

## 2013-12-23 MED ORDER — PROTAMINE SULFATE 10 MG/ML IV SOLN
INTRAVENOUS | Status: DC | PRN
  Administered 2013-12-23: 30 mg via INTRAVENOUS

## 2013-12-23 MED ORDER — HYDROCODONE-ACETAMINOPHEN 5-325 MG PO TABS
1.0000 | ORAL_TABLET | ORAL | Status: DC | PRN
  Administered 2013-12-23: 1 via ORAL
  Administered 2013-12-23: 2 via ORAL
  Administered 2013-12-24: 1 via ORAL
  Filled 2013-12-23: qty 2
  Filled 2013-12-23: qty 1
  Filled 2013-12-23 (×2): qty 2

## 2013-12-23 MED ORDER — SODIUM CHLORIDE 0.9 % IV SOLN
INTRAVENOUS | Status: DC
  Administered 2013-12-23: 07:00:00 via INTRAVENOUS

## 2013-12-23 NOTE — Op Note (Signed)
SURGEON:  Thompson Grayer, MD  PREPROCEDURE DIAGNOSES: 1. Paroxysmal atrial fibrillation. 2. Typical appearing atrial flutter  POSTPROCEDURE DIAGNOSES: 1. Paroxysmal  atrial fibrillation. 2. Typical appearing atrial flutter  PROCEDURES: 1. Comprehensive electrophysiologic study. 2. Coronary sinus pacing and recording. 3. Three-dimensional mapping of atrial fibrillation with additional mapping and ablation of a second discrete focus (atrial flutter) 4. Ablation of atrial fibrillation with additional mapping and ablation of a second discrete focus (atrial flutter) 5. Intracardiac echocardiography. 6. Transseptal puncture of an intact septum. 7. Rotational Angiography with processing at an independent workstation  INTRODUCTION:  Terri [name] is a 63 y.o. female with a history of paroxysmal atrial fibrillation and typical appearing atrial flutter who now presents for EP study and radiofrequency ablation.  The patient reports initially being diagnosed with atrial fibrillation after presenting with symptomatic palpitations and fatgiue. The patient reports increasing frequency and duration of atrial arrhythmias since that time.  The patient has failed medical therapy with beta blockers.  Further AAD options are limited by bradycardia.  The patient therefore presents today for catheter ablation of atrial fibrillation and atrial flutter.  DESCRIPTION OF PROCEDURE:  Informed written consent was obtained, and the patient was brought to the electrophysiology lab in a fasting state.  The patient was adequately sedated with intravenous medications as outlined in the anesthesia report.  The patient's left and right groins were prepped and draped in the usual sterile fashion by the EP lab staff.  Using a percutaneous Seldinger technique, two 7-French and one 11-French hemostasis sheaths were placed into the right common femoral vein.    3 Dimensional Rotational Angiography: A 5 french pigtail catheter was  introduced through the right common femoral vein and advanced into the inferior venocava.  3 demential rotational angiography was then performed by power injection of 100cc of nonionic contrast.  Reprocessing at an independent work station was then performed.   This demonstrated a moderate sized left atrium with 4 separate pulmonary veins which were small in size.  There were no anomalous veins or significant abnormalities.  A 3 dimensional rendering of the left atrium was then merged using [employer] onto the [employer] system and registered with intracardiac echo (see below).  The pigtail catheter was then removed.  Catheter Placement:  A 7-French Biosense Webster Decapolar coronary sinus catheter was introduced through the right common femoral vein and advanced into the coronary sinus for recording and pacing from this location.  A quadrapolar catheter was introduced through the right common femoral vein and advanced into the right ventricle for recording and pacing.  This catheter was then pulled back to the His bundle location.    Initial Measurements: The patient presented to the electrophysiology lab in sinus rhythm.  The patients PR interval measured 201 msec with a QRS duration of 71 msec and a QT interval of 433 msec.  The AH interval measured 100 msec and the HV interval measured 56 msec.   She had very frequent ectopic atrial activity with labile heart rates observed.  Her AV WCL was 590 msec.  Intracardiac Echocardiography: A 10-French Biosense Webster AcuNav intracardiac echocardiography catheter was introduced through the left common femoral vein and advanced into the right atrium. Intracardiac echocardiography was performed of the left atrium, and a three-dimensional anatomical rendering of the left atrium was performed using CARTO sound technology.  The patient was noted to have a moderate sized left atrium.  The interatrial septum was prominent but not aneurysmal. All 4  pulmonary veins  were visualized and noted to have separate ostia.  The pulmonary veins were small in size (15-20 mm).  The left atrial appendage was visualized and did not reveal thrombus.   There was no evidence of pulmonary vein stenosis.   Transseptal Puncture: The middle right common femoral vein sheath was exchanged for an 8.5 [place] SL2 transseptal sheath and transseptal access was achieved in a standard fashion using a Brockenbrough needle under biplane fluoroscopy with intracardiac echocardiography confirmation of the transseptal puncture.  Once transseptal access had been achieved, heparin was administered intravenously and intra- arterially in order to maintain an ACT of greater than 300 seconds throughout the procedure.   3D Mapping and Ablation: The His bundle catheter was removed and in its place a 3.5 mm Biosense TransMontaigne ablation catheter was advanced into the right atrium.  The transseptal sheath was pulled back into the IVC over a guidewire.  The ablation catheter was advanced across the transseptal hole using the wire as a guide.  The transseptal sheath was then re-advanced over the guidewire into the left atrium.  A duodecapolar Biosense Webster circular mapping catheter was introduced through the transseptal sheath and positioned over the mouth of all 4 pulmonary veins.  Three-dimensional electroanatomical mapping was performed using CARTO technology.  This demonstrated electrical activity within all four pulmonary veins at baseline. The patient underwent successful sequential electrical isolation and anatomical encircling of all four pulmonary veins using radiofrequency current with a circular mapping catheter as a guide.   The ablation catheter was then pulled back into the right atrial and positioned along the cavo-tricuspid isthmus.  Mapping along the atrial side of the isthmus was performed.  This demonstrated a standard isthmus.  A series of radiofrequency  applications were then delivered along the isthmus.   The patient was observe without return of conduction through the isthmus.  Measurements Following Ablation: Following ablation, the patient had no inducible atrial fibrillation, atrial tachycardia, atrial flutter, or sustained PACs. In sinus rhythm with RR interval was 732 msec, with PR 177 msec, QRS 86 msec, and Qt 357 msec.  Following ablation the AH interval measured 97 msec with an HV interval of 47 msec. Ventricular pacing was performed, which revealed VA dissociation when pacing at 600 msec.  Rapid atrial pacing was performed, which revealed an AV Wenckebach cycle length of 450 msec.  Electroisolation was then again confirmed in all four pulmonary veins.  Intracardiac echocardiography was again performed, which a small pericardial effusion.  The procedure was therefore considered completed.  All catheters were removed, and the sheaths were aspirated and flushed.  The patient was transferred to the recovery area for sheath removal per protocol.  A limited bedside transthoracic echocardiogram revealed no pericardial effusion.  There were no early apparent complications.  CONCLUSIONS: 1. Sinus rhythm upon presentation with very frequent ectopic atrial activity and labile heart rates observed.   2. Rotational Angiography reveals a moderate sized left atrium with four separate pulmonary veins without evidence of pulmonary vein stenosis. 3. Successful electrical isolation and anatomical encircling of all four pulmonary veins with radiofrequency current.    4. Cavo-tricuspid isthmus ablation was performed with isthmus block achieved.  5. No inducible arrhythmias following ablation  6. No early apparent complications.   Jovannie Ulibarri,MD 10:59 AM 12/23/2013

## 2013-12-23 NOTE — Anesthesia Procedure Notes (Signed)
Procedure Name: MAC Date/Time: 12/23/2013 7:55 AM Performed by: Susa Loffler Pre-anesthesia Checklist: Patient identified, Patient being monitored, Emergency Drugs available, Timeout performed and Suction available Patient Re-evaluated:Patient Re-evaluated prior to inductionOxygen Delivery Method: Simple face mask Preoxygenation: Pre-oxygenation with 100% oxygen Dental Injury: Teeth and Oropharynx as per pre-operative assessment

## 2013-12-23 NOTE — Transfer of Care (Signed)
Immediate Anesthesia Transfer of Care Note  Patient: Terri [name]  Procedure(s) Performed: Procedure(s): ATRIAL FIBRILLATION ABLATION (N/A)  Patient Location: Cath Lab  Anesthesia Type:MAC  Level of Consciousness: awake, alert  and oriented  Airway & Oxygen Therapy: Patient Spontanous Breathing and Patient connected to nasal cannula oxygen  Post-op Assessment: Report given to PACU RN and Post -op Vital signs reviewed and stable  Post vital signs: Reviewed and stable  Complications: No apparent anesthesia complications

## 2013-12-23 NOTE — Anesthesia Preprocedure Evaluation (Addendum)
Anesthesia Evaluation  Patient identified by MRN, date of birth, ID band Patient awake    Reviewed: Allergy & Precautions, H&P , NPO status , Patient's Chart, lab work & pertinent test results  Airway Mallampati: I TM Distance: >3 FB Neck ROM: Full    Dental  (+) Teeth Intact, Dental Advisory Given   Pulmonary          Cardiovascular + dysrhythmias Atrial Fibrillation     Neuro/Psych Depression    GI/Hepatic   Endo/Other  Hypothyroidism   Renal/GU      Musculoskeletal  (+) Arthritis -,   Abdominal   Peds  Hematology   Anesthesia Other Findings   Reproductive/Obstetrics                          Anesthesia Physical Anesthesia Plan  ASA: II  Anesthesia Plan: MAC   Post-op Pain Management:    Induction: Intravenous  Airway Management Planned: Natural Airway and Simple Face Mask  Additional Equipment:   Intra-op Plan:   Post-operative Plan:   Informed Consent: I have reviewed the patients History and Physical, chart, labs and discussed the procedure including the risks, benefits and alternatives for the proposed anesthesia with the patient or authorized representative who has indicated his/her understanding and acceptance.   Dental advisory given  Plan Discussed with: Surgeon and Anesthesiologist  Anesthesia Plan Comments:        Anesthesia Quick Evaluation

## 2013-12-23 NOTE — H&P (Signed)
Primary Care Physician: Terri Screws, MD Referring Physician:  Dr Jovita [name] is a 63 y.o. female with a h/o recently diagnosed atrial fibrillation who presents for ablation.  She reports that she underwent colonoscopy 12/14 and was found to have afib during the study.  She was referred to Terri Meda Coffee who placed an event monitor.  This documented frequent afib as well as atrial flutter with RVR.  She was originally placed on metoprolol but she did not tolerate this due to hypotension.  She reports symptoms of tachypalpitations and dizziness with afib.  She has occasional nausea.  She has some fatigue and decreased exercise tolerance with her afib.  She finds that she will have episodes an hour or so after working out and also at times when she eats.  Occasionally episodes occur without warning.  She exercises regularly without symptoms of ischemia.  Episodes occur most days, typically lasting only several minutes.  Today, she denies symptoms of chest pain,  orthopnea, PND, lower extremity edema,  presyncope, syncope, or neurologic sequela. The patient is tolerating medications without difficulties and is otherwise without complaint today.     Past Medical History   Diagnosis  Date   .  Depression     .  Hypothyroidism     .  Dyspareunia     .  Fibroid     .  Arthritis         lt shoulder   .  Paroxysmal atrial fibrillation     .  Atrial flutter         Past Surgical History   Procedure  Laterality  Date   .  Abdominal hysterectomy       .  Breast enhancement surgery       .  Fracture surgery  Right         wrist   .  Cosmetic surgery       .  Augmentation mammaplasty       .  Flexible sigmoidoscopy  N/A  07/29/2013       Procedure: FLEXIBLE SIGMOIDOSCOPY;  Surgeon: [name], MD;  Location: [facility];  Service: Endoscopy;  Laterality: N/A;         Current Outpatient Prescriptions   Medication  Sig  Dispense  Refill   .  aspirin EC 325 MG tablet  Take  1 tablet (325 mg total) by mouth daily.   30 tablet   0   .  Biotin 5000 MCG CAPS  Take 1 capsule by mouth daily.         .  Calcium Carbonate-Vit D-Min (CALCIUM 1200 PO)  Take 600 mg twice a day         .  fish oil-omega-3 fatty acids 1000 MG capsule  Take 1 g by mouth daily.         Terri [name] Kitchen  levothyroxine (SYNTHROID, LEVOTHROID) 50 MCG tablet  Take 50 mcg by mouth daily.         .  sertraline (ZOLOFT) 100 MG tablet  Take 150 mg by mouth daily.             No current facility-administered medications for this visit.     No Known Allergies    History       Social History   .  Marital Status:  Married       Spouse Name:  N/A       Number of Children:  N/A   .  Years of Education:  N/A       Occupational History   .  Not on file.       Social History Main Topics   .  Smoking status:  Never Smoker    .  Smokeless tobacco:  Never Used   .  Alcohol Use:  Yes         Comment: 7 glasses of wine per week   .  Drug Use:  No   .  Sexual Activity:  Yes       Partners:  Male       Birth Control/ Protection:  None, Surgical         Comment: TVH--still has ovaries       Other Topics  Concern   .  Not on file       Social History Narrative     Pt lives in [place] with spouse.  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    Family History   Problem  Relation  Age of Onset   .  Cancer  Mother     .  Hypertension  Mother     .  Thyroid disease  Mother     .  Cancer  Paternal Grandmother     .  Breast cancer  Paternal Grandmother     .  Thyroid disease  Sister     .  Stroke  Maternal Grandmother      ROS- All systems are reviewed and negative except as per the HPI above   Physical Exam: Filed Vitals:   12/23/13 0555  BP: 86/65  Pulse: 66  Temp: 98 F (36.7 C)  Resp: 20   GEN- The patient is well appearing, alert and oriented x 3 today.    Head- normocephalic, atraumatic Eyes-  Sclera clear, conjunctiva pink Ears- hearing intact Oropharynx- clear Neck- supple, no  JVP Lymph- no cervical lymphadenopathy Lungs- Clear to ausculation bilaterally, normal work of breathing Heart- Regular rate and rhythm, no murmurs, rubs or gallops, PMI not laterally displaced GI- soft, NT, ND, + BS Extremities- no clubbing, cyanosis, or edema Neuro- strength and sensation are intact  TEE reviewed  Assessment and Plan:  1. Atrial fibrillation/ atrial flutter The patient has symptomatic paroxysmal atrial arrhythmias.  She has failed medical therapy with beta blockers.  Given chronic hypotension and bradycardia, her AAD options are limited.  Therapeutic strategies for atrial arrhythmias including medicine and ablation were discussed in detail with the patient today. Risk, benefits, and alternatives to EP study and radiofrequency ablation for afib were also discussed in detail today. These risks include but are not limited to stroke, bleeding, vascular damage, tamponade, perforation, damage to the esophagus, lungs, and other structures, pulmonary vein stenosis, worsening renal function, and death. The patient understands these risk and wishes to proceed.   She reports compliance with xarelto without interruption.

## 2013-12-23 NOTE — Anesthesia Postprocedure Evaluation (Signed)
  Anesthesia Post-op Note  Patient: Terri [name]  Procedure(s) Performed: Procedure(s): ATRIAL FIBRILLATION ABLATION (N/A)  Patient Location: PACU  Anesthesia Type:MAC  Level of Consciousness: awake and alert   Airway and Oxygen Therapy: Patient Spontanous Breathing  Post-op Pain: none  Post-op Assessment: Post-op Vital signs reviewed, Patient's Cardiovascular Status Stable, Respiratory Function Stable, Patent Airway, No signs of Nausea or vomiting, Adequate PO intake and Pain level controlled  Post-op Vital Signs: Reviewed and stable  Last Vitals:  Filed Vitals:   12/23/13 0555  BP: 86/65  Pulse: 66  Temp: 36.7 C  Resp: 20    Complications: No apparent anesthesia complications

## 2013-12-23 NOTE — Progress Notes (Signed)
Utilization Review Completed.Terri Breon T Dowell5/19/2015  

## 2013-12-24 ENCOUNTER — Encounter (HOSPITAL_COMMUNITY): Payer: Self-pay | Admitting: *Deleted

## 2013-12-24 DIAGNOSIS — I4891 Unspecified atrial fibrillation: Secondary | ICD-10-CM

## 2013-12-24 DIAGNOSIS — I4892 Unspecified atrial flutter: Secondary | ICD-10-CM

## 2013-12-24 LAB — BASIC METABOLIC PANEL
BUN: 11 mg/dL (ref 6–23)
CHLORIDE: 107 meq/L (ref 96–112)
CO2: 28 mEq/L (ref 19–32)
Calcium: 8.4 mg/dL (ref 8.4–10.5)
Creatinine, Ser: 0.7 mg/dL (ref 0.50–1.10)
GFR calc non Af Amer: 89 mL/min — ABNORMAL LOW (ref >90)
Glucose, Bld: 85 mg/dL (ref 70–99)
POTASSIUM: 4.3 meq/L (ref 3.7–5.3)
SODIUM: 142 meq/L (ref 137–147)

## 2013-12-24 MED ORDER — PANTOPRAZOLE SODIUM 40 MG PO TBEC: 40.0000 mg | DELAYED_RELEASE_TABLET | Freq: Every day | ORAL | Status: DC

## 2013-12-24 NOTE — Discharge Summary (Signed)
ELECTROPHYSIOLOGY PROCEDURE DISCHARGE SUMMARY    Patient ID: Terri [name],  MRN: [id number], DOB/AGE: 09/26/1950 63 y.o.  Admit date: 12/23/2013 Discharge date: 12/24/2013  Primary Care Physician: [name], MD Primary Cardiologist: Meda Coffee Electrophysiologist: Thompson Grayer, MD  Primary Discharge Diagnosis:  Atrial fibrillation and atrial flutter status post ablation this admission  Secondary Discharge Diagnosis:  1.  Depression 2.  Hypothyroidism 3.  Arthritis  Procedures This Admission:  1.  Electrophysiology study and radiofrequency catheter ablation on 12-23-2013 by Dr Thompson Grayer.  This study demonstrated sinus rhythm upon presentation with very frequent ectopic atrial activity and labile heart rates observed; rotational Angiography reveals a moderate sized left atrium with four separate pulmonary veins without evidence of pulmonary vein stenosis; successful electrical isolation and anatomical encircling of all four pulmonary veins with radiofrequency current; cavo-tricuspid isthmus ablation was performed with isthmus block achieved. There were no inducible arrhythmias following ablation and no early apparent complications   Brief HPI: Terri [name] is a 63 y.o. female with a history of paroxysmal atrial fibrillation and typical atrial flutter.  They have failed medical therapy with beta blockers. Risks, benefits, and alternatives to catheter ablation of atrial fibrillation were reviewed with the patient who wished to proceed.  The patient underwent TEE prior to the procedure which demonstrated normal LV function and no LAA thrombus.    Hospital Course:  The patient was admitted and underwent EPS/RFCA of atrial fibrillation with details as outlined above.  They were monitored on telemetry overnight which demonstrated sinus rhythm.  Groin was without complication on the day of discharge.  The patient was examined by Dr Rayann Heman and considered to be stable for discharge.   Wound care and restrictions were reviewed with the patient.  The patient will be seen back by Dr Rayann Heman in 12 weeks for post ablation follow up.   Discharge Vitals: Blood pressure 103/58, pulse 57, temperature 98.2 F (36.8 C), temperature source Oral, resp. rate 15, height 5\' 3"  (1.6 m), weight 118 lb 6.2 oz (53.7 kg), SpO2 97.00%.  Physical Exam: Filed Vitals:   12/24/13 0002 12/24/13 0355 12/24/13 0400 12/24/13 0418  BP: 86/48 85/46 88/46  103/58  Pulse: 59 58 57   Temp: 98 F (36.7 C) 98.1 F (36.7 C) 98.2 F (36.8 C)   TempSrc: Oral Oral Oral   Resp:      Height:      Weight:      SpO2: 97% 97%      GEN- The patient is well appearing, alert and oriented x 3 today.   Head- normocephalic, atraumatic Eyes-  Sclera clear, conjunctiva pink Ears- hearing intact Oropharynx- clear Neck- supple,  Lungs- Clear to ausculation bilaterally, normal work of breathing Heart- Regular rate and rhythm, no murmurs, rubs or gallops, PMI not laterally displaced GI- soft, NT, ND, + BS Extremities- no clubbing, cyanosis, or edema, no hematoma/ bruit MS- no significant deformity or atrophy Skin- no rash or lesion Psych- euthymic mood, full affect Neuro- strength and sensation are intact   Labs:   Lab Results  Component Value Date   WBC 5.7 12/16/2013   HGB 12.7 12/16/2013   HCT 37.5 12/16/2013   MCV 96.6 12/16/2013   PLT 191.0 12/16/2013     Recent Labs Lab 12/24/13 0317  NA 142  K 4.3  CL 107  CO2 28  BUN 11  CREATININE 0.70  CALCIUM 8.4  GLUCOSE 85      Discharge Medications:    Medication List  Biotin 5000 MCG Caps  Take 1 capsule by mouth daily.     CALCIUM 1200 PO  Take 600 mg elemental calcium/kg/hr by mouth 2 (two) times daily.     fish oil-omega-3 fatty acids 1000 MG capsule  Take 1 g by mouth daily.     levothyroxine 50 MCG tablet  Commonly known as:  SYNTHROID, LEVOTHROID  Take 50 mcg by mouth daily.     magnesium gluconate 500 MG tablet    Commonly known as:  MAGONATE  Take 250 mg by mouth 2 (two) times daily.     pantoprazole 40 MG tablet  Commonly known as:  PROTONIX  Take 1 tablet (40 mg total) by mouth daily.     rivaroxaban 20 MG Tabs tablet  Commonly known as:  XARELTO  Take 1 tablet (20 mg total) by mouth daily with supper.     sertraline 100 MG tablet  Commonly known as:  ZOLOFT  Take 150 mg by mouth daily.        Disposition:   Follow-up Information   Follow up with Thompson Grayer, MD On 03/25/2014. (2:15 pm)    Specialty:  Cardiology   Contact information:   [address] [place] [address] [phone]       Duration of Discharge Encounter: Greater than 30 minutes including physician time.  Signed,  Thompson Grayer

## 2013-12-24 NOTE — Discharge Instructions (Signed)
No driving for 5 days. No lifting over 5 lbs for 1 week. No sexual activity for 1 week. You may return to work in 1 week. Keep procedure site clean & dry. If you notice increased pain, swelling, bleeding or pus, call/return!  You may shower, but no soaking baths/hot tubs/pools for 1 week.  ° ° °

## 2014-01-09 ENCOUNTER — Telehealth: Payer: Self-pay | Admitting: Internal Medicine

## 2014-01-09 NOTE — Telephone Encounter (Signed)
New message      Pt had an ablation 3 weeks ago.---have questions for the nurse

## 2014-01-14 NOTE — Telephone Encounter (Signed)
Left message for patient to return my call.

## 2014-01-15 NOTE — Telephone Encounter (Signed)
Not able to breath and this was constant for 3 days but is all better now.  She will call me if she feels symptoms come back or she needs to be seen sooner

## 2014-01-15 NOTE — Telephone Encounter (Signed)
Follow up  ° ° ° °Returning call back to nurse  °

## 2014-03-08 ENCOUNTER — Ambulatory Visit (INDEPENDENT_AMBULATORY_CARE_PROVIDER_SITE_OTHER): Payer: Commercial Managed Care - HMO | Admitting: Family Medicine

## 2014-03-08 VITALS — BP 100/68 | HR 77 | Temp 98.0°F | Resp 16 | Ht 63.0 in | Wt 117.2 lb

## 2014-03-08 DIAGNOSIS — R350 Frequency of micturition: Secondary | ICD-10-CM

## 2014-03-08 DIAGNOSIS — N390 Urinary tract infection, site not specified: Secondary | ICD-10-CM

## 2014-03-08 LAB — POCT URINALYSIS DIPSTICK
Bilirubin, UA: NEGATIVE
GLUCOSE UA: NEGATIVE
Ketones, UA: NEGATIVE
Nitrite, UA: NEGATIVE
Protein, UA: 100
Spec Grav, UA: 1.015
UROBILINOGEN UA: 0.2
pH, UA: 8.5

## 2014-03-08 LAB — POCT UA - MICROSCOPIC ONLY
CRYSTALS, UR, HPF, POC: NEGATIVE
Casts, Ur, LPF, POC: NEGATIVE
Mucus, UA: NEGATIVE
Yeast, UA: NEGATIVE

## 2014-03-08 MED ORDER — NITROFURANTOIN MONOHYD MACRO 100 MG PO CAPS: 100.0000 mg | ORAL_CAPSULE | Freq: Two times a day (BID) | ORAL | Status: DC

## 2014-03-08 MED ORDER — PHENAZOPYRIDINE HCL 100 MG PO TABS: 100.0000 mg | ORAL_TABLET | Freq: Three times a day (TID) | ORAL | Status: DC | PRN

## 2014-03-08 NOTE — Patient Instructions (Signed)
Start macrobid for infection, drink plenty of fluids, pyridium if needed.  We will check a urine culture - should have results of this in next 4-5 days. Return to the clinic or go to the nearest emergency room if any of your symptoms worsen or new symptoms occur. Urinary Tract Infection Urinary tract infections (UTIs) can develop anywhere along your urinary tract. Your urinary tract is your body's drainage system for removing wastes and extra water. Your urinary tract includes two kidneys, two ureters, a bladder, and a urethra. Your kidneys are a pair of bean-shaped organs. Each kidney is about the size of your fist. They are located below your ribs, one on each side of your spine. CAUSES Infections are caused by microbes, which are microscopic organisms, including fungi, viruses, and bacteria. These organisms are so small that they can only be seen through a microscope. Bacteria are the microbes that most commonly cause UTIs. SYMPTOMS  Symptoms of UTIs may vary by age and gender of the patient and by the location of the infection. Symptoms in young women typically include a frequent and intense urge to urinate and a painful, burning feeling in the bladder or urethra during urination. Older women and men are more likely to be tired, shaky, and weak and have muscle aches and abdominal pain. A fever may mean the infection is in your kidneys. Other symptoms of a kidney infection include pain in your back or sides below the ribs, nausea, and vomiting. DIAGNOSIS To diagnose a UTI, your caregiver will ask you about your symptoms. Your caregiver also will ask to provide a urine sample. The urine sample will be tested for bacteria and white blood cells. White blood cells are made by your body to help fight infection. TREATMENT  Typically, UTIs can be treated with medication. Because most UTIs are caused by a bacterial infection, they usually can be treated with the use of antibiotics. The choice of antibiotic and  length of treatment depend on your symptoms and the type of bacteria causing your infection. HOME CARE INSTRUCTIONS  If you were prescribed antibiotics, take them exactly as your caregiver instructs you. Finish the medication even if you feel better after you have only taken some of the medication.  Drink enough water and fluids to keep your urine clear or pale yellow.  Avoid caffeine, tea, and carbonated beverages. They tend to irritate your bladder.  Empty your bladder often. Avoid holding urine for long periods of time.  Empty your bladder before and after sexual intercourse.  After a bowel movement, women should cleanse from front to back. Use each tissue only once. SEEK MEDICAL CARE IF:   You have back pain.  You develop a fever.  Your symptoms do not begin to resolve within 3 days. SEEK IMMEDIATE MEDICAL CARE IF:   You have severe back pain or lower abdominal pain.  You develop chills.  You have nausea or vomiting.  You have continued burning or discomfort with urination. MAKE SURE YOU:   Understand these instructions.  Will watch your condition.  Will get help right away if you are not doing well or get worse. Document Released: 05/03/2005 Document Revised: 01/23/2012 Document Reviewed: 09/01/2011 [facility] Patient Information 2015 [place], [place]. This information is not intended to replace advice given to you by your health care provider. Make sure you discuss any questions you have with your health care provider.

## 2014-03-08 NOTE — Progress Notes (Addendum)
This chart was scribed for [name], MD by Thea Alken, ED Scribe. This patient was seen in room 10 and the patient's care was started at 8:47 AM. Subjective:    Patient ID: Terri [name], female    DOB: 03/23/1951, 63 y.o.   MRN: [id number]  Urinary Frequency  Associated symptoms include frequency. Pertinent negatives include no chills or hematuria.   Chief Complaint  Patient presents with  . Urinary Frequency    x 2 days   HPI Comments: Terri [name] is a 63 y.o. female who presents to the [facility] complaining of urinary frequency x 3 days. She believes she has a bladder infection. She reports symptoms began 3 days ago went away but returned today. She reports feeling pressure to bladder, lower back pain and mild dysuria. Pt states that her urine "bubbles". Pt has taken tylenol and drank cranberry juice without relief to symptoms. Pt takes xarelto for an ablation that she had 8 months ago. Pt denies fever,abdominal pain, appetite change and hematuria.   Patient Active Problem List   Diagnosis Date Noted  . Atrial fibrillation 12/23/2013  . Atrial flutter 11/05/2013  . Paroxysmal atrial fibrillation 08/28/2013  . Palpitations 08/28/2013  . Dizziness 08/28/2013   Past Medical History  Diagnosis Date  . Depression   . Hypothyroidism   . Dyspareunia   . Fibroid   . Arthritis     lt shoulder  . Paroxysmal atrial fibrillation   . Atrial flutter    Past Surgical History  Procedure Laterality Date  . Abdominal hysterectomy    . Breast enhancement surgery    . Fracture surgery Right     wrist  . Cosmetic surgery    . Augmentation mammaplasty    . Flexible sigmoidoscopy N/A 07/29/2013    Procedure: FLEXIBLE SIGMOIDOSCOPY;  Surgeon: [name], MD;  Location: [facility] ENDOSCOPY;  Service: Endoscopy;  Laterality: N/A;  . Tee without cardioversion N/A 12/22/2013    Procedure: TRANSESOPHAGEAL ECHOCARDIOGRAM (TEE);  Surgeon: [name], MD;   Location: [facility] ENDOSCOPY;  Service: Cardiovascular;  Laterality: N/A;  . Ablation  12-23-2013    PVI and CTI by Dr Rayann Heman   No Known Allergies Prior to Admission medications   Medication Sig Start Date End Date Taking? Authorizing Provider  Biotin 5000 MCG CAPS Take 1 capsule by mouth daily.   Yes Historical Provider, MD  Calcium Carbonate-Vit D-Min (CALCIUM 1200 PO) Take 600 mg elemental calcium/kg/hr by mouth 2 (two) times daily.    Yes Historical Provider, MD  fish oil-omega-3 fatty acids 1000 MG capsule Take 1 g by mouth daily.   Yes Historical Provider, MD  levothyroxine (SYNTHROID, LEVOTHROID) 50 MCG tablet Take 50 mcg by mouth daily.   Yes Historical Provider, MD  magnesium gluconate (MAGONATE) 500 MG tablet Take 250 mg by mouth 2 (two) times daily.   Yes Historical Provider, MD  Rivaroxaban (XARELTO) 20 MG TABS tablet Take 1 tablet (20 mg total) by mouth daily with supper. 11/03/13  Yes Thompson Grayer, MD  sertraline (ZOLOFT) 100 MG tablet Take 150 mg by mouth daily.   Yes Historical Provider, MD   History   Social History  . Marital Status: Married    Spouse Name: N/A    Number of Children: N/A  . Years of Education: N/A   Occupational History  . Not on file.   Social History Main Topics  . Smoking status: Never Smoker   . Smokeless tobacco: Never Used  .  Alcohol Use: Yes     Comment: 7 glasses of wine per week  . Drug Use: No  . Sexual Activity: Yes    Partners: Male    Birth Control/ Protection: None, Surgical     Comment: TVH--still has ovaries   Other Topics Concern  . Not on file   Social History Narrative   Pt lives in [place] with spouse.  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   Review of Systems  Constitutional: Negative for fever, chills and appetite change.  Gastrointestinal: Negative for abdominal pain and abdominal distention.  Genitourinary: Positive for dysuria and frequency. Negative for hematuria.  Musculoskeletal: Positive for back pain and  myalgias.    Objective:   Physical Exam  Nursing note and vitals reviewed. Constitutional: She is oriented to person, place, and time. She appears well-developed and well-nourished. No distress.  HENT:  Head: Normocephalic and atraumatic.  Eyes: Conjunctivae and EOM are normal.  Neck: Neck supple.  Cardiovascular: Normal rate, regular rhythm and normal heart sounds.  Exam reveals no gallop and no friction rub.   No murmur heard. No ectopic beats  Pulmonary/Chest: Effort normal and breath sounds normal. No respiratory distress. She has no wheezes. She has no rales. She exhibits no tenderness.  Abdominal: Soft. She exhibits no distension. There is no tenderness. There is no CVA tenderness.  Musculoskeletal: Normal range of motion.  Neurological: She is alert and oriented to person, place, and time.  Skin: Skin is warm and dry.  Psychiatric: She has a normal mood and affect. Her behavior is normal.    Filed Vitals:   03/08/14 0824  BP: 100/68  Pulse: 77  Temp: 98 F (36.7 C)  Resp: 16   Results for orders placed in visit on 03/08/14  POCT URINALYSIS DIPSTICK      Result Value Ref Range   Color, UA yellow     Clarity, UA cloudy     Glucose, UA neg     Bilirubin, UA neg     Ketones, UA neg     Spec Grav, UA 1.015     Blood, UA mod     pH, UA 8.5     Protein, UA 100     Urobilinogen, UA 0.2     Nitrite, UA neg     Leukocytes, UA large (3+)    POCT UA - MICROSCOPIC ONLY      Result Value Ref Range   WBC, Ur, HPF, POC tntc     RBC, urine, microscopic tntc     Bacteria, U Microscopic 1+     Mucus, UA neg     Epithelial cells, urine per micros 0-2     Crystals, Ur, HPF, POC neg     Casts, Ur, LPF, POC neg     Yeast, UA neg     Assessment & Plan:   Terri [name] is a 63 y.o. female Frequent urination - Plan: POCT urinalysis dipstick, POCT UA - Microscopic Only, phenazopyridine (PYRIDIUM) 100 MG tablet, Urine culture, nitrofurantoin, macrocrystal-monohydrate,  (MACROBID) 100 MG capsule, DISCONTINUED: nitrofurantoin, macrocrystal-monohydrate, (MACROBID) 100 MG capsule  Urinary tract infection, site not specified - Plan: phenazopyridine (PYRIDIUM) 100 MG tablet, Urine culture, nitrofurantoin, macrocrystal-monohydrate, (MACROBID) 100 MG capsule, DISCONTINUED: nitrofurantoin, macrocrystal-monohydrate, (MACROBID) 100 MG capsule  UTI. Early.  Start macrobid, azo if needed (SED), fluids/sx care and urine culture. rtc precautions. Info below.   Meds ordered this encounter  Medications  . phenazopyridine (PYRIDIUM) 100 MG tablet    Sig: Take  1 tablet (100 mg total) by mouth 3 (three) times daily as needed for pain.    Dispense:  10 tablet    Refill:  0  . DISCONTD: nitrofurantoin, macrocrystal-monohydrate, (MACROBID) 100 MG capsule    Sig: Take 1 capsule (100 mg total) by mouth 2 (two) times daily.    Dispense:  14 capsule    Refill:  0  . nitrofurantoin, macrocrystal-monohydrate, (MACROBID) 100 MG capsule    Sig: Take 1 capsule (100 mg total) by mouth 2 (two) times daily.    Dispense:  14 capsule    Refill:  0   Patient Instructions  Start macrobid for infection, drink plenty of fluids, pyridium if needed.  We will check a urine culture - should have results of this in next 4-5 days. Return to the clinic or go to the nearest emergency room if any of your symptoms worsen or new symptoms occur. Urinary Tract Infection Urinary tract infections (UTIs) can develop anywhere along your urinary tract. Your urinary tract is your body's drainage system for removing wastes and extra water. Your urinary tract includes two kidneys, two ureters, a bladder, and a urethra. Your kidneys are a pair of bean-shaped organs. Each kidney is about the size of your fist. They are located below your ribs, one on each side of your spine. CAUSES Infections are caused by microbes, which are microscopic organisms, including fungi, viruses, and bacteria. These organisms are so small  that they can only be seen through a microscope. Bacteria are the microbes that most commonly cause UTIs. SYMPTOMS  Symptoms of UTIs may vary by age and gender of the patient and by the location of the infection. Symptoms in young women typically include a frequent and intense urge to urinate and a painful, burning feeling in the bladder or urethra during urination. Older women and men are more likely to be tired, shaky, and weak and have muscle aches and abdominal pain. A fever may mean the infection is in your kidneys. Other symptoms of a kidney infection include pain in your back or sides below the ribs, nausea, and vomiting. DIAGNOSIS To diagnose a UTI, your caregiver will ask you about your symptoms. Your caregiver also will ask to provide a urine sample. The urine sample will be tested for bacteria and white blood cells. White blood cells are made by your body to help fight infection. TREATMENT  Typically, UTIs can be treated with medication. Because most UTIs are caused by a bacterial infection, they usually can be treated with the use of antibiotics. The choice of antibiotic and length of treatment depend on your symptoms and the type of bacteria causing your infection. HOME CARE INSTRUCTIONS  If you were prescribed antibiotics, take them exactly as your caregiver instructs you. Finish the medication even if you feel better after you have only taken some of the medication.  Drink enough water and fluids to keep your urine clear or pale yellow.  Avoid caffeine, tea, and carbonated beverages. They tend to irritate your bladder.  Empty your bladder often. Avoid holding urine for long periods of time.  Empty your bladder before and after sexual intercourse.  After a bowel movement, women should cleanse from front to back. Use each tissue only once. SEEK MEDICAL CARE IF:   You have back pain.  You develop a fever.  Your symptoms do not begin to resolve within 3 days. SEEK IMMEDIATE  MEDICAL CARE IF:   You have severe back pain or lower abdominal pain.  You develop chills.  You have nausea or vomiting.  You have continued burning or discomfort with urination. MAKE SURE YOU:   Understand these instructions.  Will watch your condition.  Will get help right away if you are not doing well or get worse. Document Released: 05/03/2005 Document Revised: 01/23/2012 Document Reviewed: 09/01/2011 [facility] Patient Information 2015 [place], [place]. This information is not intended to replace advice given to you by your health care provider. Make sure you discuss any questions you have with your health care provider.       I personally performed the services described in this documentation, which was scribed in my presence. The recorded information has been reviewed and considered, and addended by me as needed.

## 2014-03-10 LAB — URINE CULTURE: Colony Count: >=100000

## 2014-03-19 ENCOUNTER — Telehealth: Payer: Self-pay

## 2014-03-19 NOTE — Telephone Encounter (Signed)
Patient called for samples of xarelto placed samples up front 

## 2014-03-25 ENCOUNTER — Encounter: Payer: Self-pay | Admitting: Internal Medicine

## 2014-03-25 ENCOUNTER — Ambulatory Visit (INDEPENDENT_AMBULATORY_CARE_PROVIDER_SITE_OTHER): Payer: Medicare HMO | Admitting: Internal Medicine

## 2014-03-25 VITALS — BP 102/68 | HR 59 | Ht 63.0 in | Wt 118.0 lb

## 2014-03-25 DIAGNOSIS — I4891 Unspecified atrial fibrillation: Secondary | ICD-10-CM

## 2014-03-25 DIAGNOSIS — I4892 Unspecified atrial flutter: Secondary | ICD-10-CM

## 2014-03-25 NOTE — Progress Notes (Signed)
PCP: Terri Screws, MD Primary Cardiologist:  Dr Terri [name] is a 63 y.o. female who presents today for routine electrophysiology followup.  Since her recent ablation, the patient reports doing very well.  She denies procedure related complications.  She had ERAF early post ablation which has resolved  Today, she denies symptoms of palpitations, chest pain, shortness of breath,  lower extremity edema, dizziness, presyncope, or syncope.  The patient is otherwise without complaint today.   Past Medical History  Diagnosis Date  . Depression   . Hypothyroidism   . Dyspareunia   . Fibroid   . Arthritis     lt shoulder  . Paroxysmal atrial fibrillation   . Atrial flutter    Past Surgical History  Procedure Laterality Date  . Abdominal hysterectomy    . Breast enhancement surgery    . Fracture surgery Right     wrist  . Cosmetic surgery    . Augmentation mammaplasty    . Flexible sigmoidoscopy N/A 07/29/2013    Procedure: FLEXIBLE SIGMOIDOSCOPY;  Surgeon: [name], MD;  Location: [facility] ENDOSCOPY;  Service: Endoscopy;  Laterality: N/A;  . Tee without cardioversion N/A 12/22/2013    Procedure: TRANSESOPHAGEAL ECHOCARDIOGRAM (TEE);  Surgeon: [name], MD;  Location: [facility] ENDOSCOPY;  Service: Cardiovascular;  Laterality: N/A;  . Ablation  12-23-2013    PVI and CTI by Dr Rayann Heman    ROS- all systems are reviewed and negatives except as per HPI above  Current Outpatient Prescriptions  Medication Sig Dispense Refill  . Biotin 5000 MCG CAPS Take 1 capsule by mouth daily.      . Calcium Carbonate-Vit D-Min (CALCIUM 1200 PO) Take 600 mg elemental calcium/kg/hr by mouth daily.       . fish oil-omega-3 fatty acids 1000 MG capsule Take 1 g by mouth daily.      Marland Kitchen levothyroxine (SYNTHROID, LEVOTHROID) 50 MCG tablet Take 50 mcg by mouth daily.      . magnesium gluconate (MAGONATE) 500 MG tablet Take 250 mg by mouth 2 (two) times daily.      . Multiple Vitamin (MULTIVITAMIN)  capsule Take 1 capsule by mouth daily.      . Rivaroxaban (XARELTO) 20 MG TABS tablet Take 1 tablet (20 mg total) by mouth daily with supper.  30 tablet  11  . sertraline (ZOLOFT) 100 MG tablet Take 150 mg by mouth daily.       No current facility-administered medications for this visit.    Physical Exam: Filed Vitals:   03/25/14 1429  BP: 102/68  Pulse: 59  Height: 5\' 3"  (1.6 m)  Weight: 118 lb (53.524 kg)    GEN- The patient is well appearing, alert and oriented x 3 today.   Head- normocephalic, atraumatic Eyes-  Sclera clear, conjunctiva pink Ears- hearing intact Oropharynx- clear Lungs- Clear to ausculation bilaterally, normal work of breathing Heart- Regular rate and rhythm, no murmurs, rubs or gallops, PMI not laterally displaced GI- soft, NT, ND, + BS Extremities- no clubbing, cyanosis, or edema  ekg today reveals sinus rhythm  Assessment and Plan:  1. Afib/ atrial flutter Doing well s/p ablation off of AAD chads2vasc score is 2.  She is clear in her decision to stop anticoagulation at this time  Return in 3 months

## 2014-03-25 NOTE — Patient Instructions (Addendum)
Your physician has recommended you make the following change in your medication:  1.)STOP [place] physician recommends that you schedule a follow-up appointment in: [place].

## 2014-04-27 ENCOUNTER — Ambulatory Visit (INDEPENDENT_AMBULATORY_CARE_PROVIDER_SITE_OTHER): Payer: Commercial Managed Care - HMO | Admitting: Obstetrics and Gynecology

## 2014-04-27 ENCOUNTER — Encounter: Payer: Self-pay | Admitting: Obstetrics and Gynecology

## 2014-04-27 VITALS — BP 92/60 | HR 76 | Resp 16 | Ht 63.0 in | Wt 117.0 lb

## 2014-04-27 DIAGNOSIS — Z01419 Encounter for gynecological examination (general) (routine) without abnormal findings: Secondary | ICD-10-CM

## 2014-04-27 DIAGNOSIS — R3129 Other microscopic hematuria: Secondary | ICD-10-CM

## 2014-04-27 DIAGNOSIS — Z0000 Encounter for general adult medical examination without abnormal findings: Secondary | ICD-10-CM

## 2014-04-27 LAB — POCT URINALYSIS DIPSTICK
Bilirubin, UA: NEGATIVE
Glucose, UA: NEGATIVE
Ketones, UA: NEGATIVE
NITRITE UA: NEGATIVE
PROTEIN UA: NEGATIVE
UROBILINOGEN UA: NEGATIVE
pH, UA: 7

## 2014-04-27 NOTE — Progress Notes (Signed)
GYNECOLOGY VISIT  PCP:   Referring provider:   HPI: 63 y.o.   Married  Caucasian  female   G2P2002 with No LMP recorded. Patient has had a hysterectomy.  Status post [facility] for fibroids.  Ovaries retained.  here for   Annual Gynecological Examination   Having palpitations and dizziness.  History of atrial fibrillation.  Had cardiac ablation.  Just stopped Xarelto.  Stopped all ERT.  No hot flashes, just joint pain.   Recent UTI treated this month.   Hgb:  12.7 - 12/16/13  Urine:  Trace RBCs and trace WBCs.  PH 7.0. - Some bladder pressure but not anything like when she had recent UTI and not occurring consistently.  GYNECOLOGIC HISTORY: No LMP recorded. Patient has had a hysterectomy. Sexually active: No   Partner preference: Female Contraception:  Hysterectomy   Menopausal hormone therapy: No DES exposure: No    Blood transfusions: No   Sexually transmitted diseases: Hx of Abnormal pap  GYN procedures and prior surgeries:  Hysterectomy,  Last mammogram: 11/2013 BIRADS1: Neg                Last pap and high risk HPV testing:  12/2000 Neg  History of abnormal pap smear:  yes   OB History   Grav Para Term Preterm Abortions TAB SAB Ect Mult Living   2 2 2       2        LIFESTYLE: Exercise:  Yes, cardio, weights, pilates 6 x weekly               OTHER HEALTH MAINTENANCE: Tetanus/TDap: 2011 HPV: N/A Influenza: No   Bone density: 06/2013  Colonoscopy: 2014   Cholesterol check: 04/2013   Family History  Problem Relation Age of Onset  . Cancer Mother   . Hypertension Mother   . Thyroid disease Mother   . Cancer Paternal Grandmother   . Breast cancer Paternal Grandmother   . Thyroid disease Sister   . Stroke Maternal Grandmother     Patient Active Problem List   Diagnosis Date Noted  . Atrial fibrillation 12/23/2013  . Atrial flutter 11/05/2013  . Paroxysmal atrial fibrillation 08/28/2013  . Palpitations 08/28/2013  . Dizziness 08/28/2013   Past Medical  History  Diagnosis Date  . Depression   . Hypothyroidism   . Dyspareunia   . Fibroid   . Arthritis     lt shoulder  . Paroxysmal atrial fibrillation   . Atrial flutter     Past Surgical History  Procedure Laterality Date  . Abdominal hysterectomy    . Breast enhancement surgery    . Fracture surgery Right     wrist  . Cosmetic surgery    . Augmentation mammaplasty    . Flexible sigmoidoscopy N/A 07/29/2013    Procedure: FLEXIBLE SIGMOIDOSCOPY;  Surgeon: [name], MD;  Location: [facility] ENDOSCOPY;  Service: Endoscopy;  Laterality: N/A;  . Tee without cardioversion N/A 12/22/2013    Procedure: TRANSESOPHAGEAL ECHOCARDIOGRAM (TEE);  Surgeon: [name], MD;  Location: [facility] ENDOSCOPY;  Service: Cardiovascular;  Laterality: N/A;  . Ablation  12-23-2013    PVI and CTI by Dr Rayann Heman    ALLERGIES: Review of patient's allergies indicates no known allergies.  Current Outpatient Prescriptions  Medication Sig Dispense Refill  . aspirin 81 MG tablet Take 81 mg by mouth daily.      . Biotin 5000 MCG CAPS Take 1 capsule by mouth daily.      . Calcium Carbonate-Vit  D-Min (CALCIUM 1200 PO) Take 600 mg elemental calcium/kg/hr by mouth daily.       . fish oil-omega-3 fatty acids 1000 MG capsule Take 1 g by mouth daily.      Marland Kitchen levothyroxine (SYNTHROID, LEVOTHROID) 50 MCG tablet Take 50 mcg by mouth daily.      . magnesium gluconate (MAGONATE) 500 MG tablet Take 250 mg by mouth 2 (two) times daily.      . Multiple Vitamin (MULTIVITAMIN) capsule Take 1 capsule by mouth daily.      . sertraline (ZOLOFT) 100 MG tablet Take 150 mg by mouth daily.      Alveda Reasons 20 MG TABS tablet        No current facility-administered medications for this visit.     ROS:  Pertinent items are noted in HPI.  History   Social History  . Marital Status: Married    Spouse Name: N/A    Number of Children: N/A  . Years of Education: N/A   Occupational History  . Not on file.   Social History Main Topics   . Smoking status: Never Smoker   . Smokeless tobacco: Never Used  . Alcohol Use: 4.2 oz/week    7 Glasses of wine per week     Comment: 7 glasses of wine per week  . Drug Use: No  . Sexual Activity: Not Currently    Partners: Male    Birth Control/ Protection: None, Surgical     Comment: TVH--still has ovaries   Other Topics Concern  . Not on file   Social History Narrative   Pt lives in [place] with spouse.  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    PHYSICAL EXAMINATION:    BP 92/60  Pulse 76  Resp 16  Ht 5\' 3"  (1.6 m)  Wt 117 lb (53.071 kg)  BMI 20.73 kg/m2   Wt Readings from Last 3 Encounters:  04/27/14 117 lb (53.071 kg)  03/25/14 118 lb (53.524 kg)  03/08/14 117 lb 3.2 oz (53.162 kg)     Ht Readings from Last 3 Encounters:  04/27/14 5\' 3"  (1.6 m)  03/25/14 5\' 3"  (1.6 m)  03/08/14 5\' 3"  (1.6 m)    General appearance: alert, cooperative and appears stated age Head: Normocephalic, without obvious abnormality, atraumatic Neck: no adenopathy, supple, symmetrical, trachea midline and thyroid not enlarged, symmetric, no tenderness/mass/nodules Lungs: clear to auscultation bilaterally Breasts: consistent with reduction/augmentation.  No nipple retraction or dimpling, No nipple discharge or bleeding, No axillary or supraclavicular adenopathy, Normal to palpation without dominant masses Heart: regular rate and rhythm Abdomen: soft, non-tender; no masses,  no organomegaly Extremities: extremities normal, atraumatic, no cyanosis or edema Skin: Skin color, texture, turgor normal. No rashes or lesions Lymph nodes: Cervical, supraclavicular, and axillary nodes normal. No abnormal inguinal nodes palpated Neurologic: Grossly normal  Pelvic: External genitalia:  no lesions              Urethra:  normal appearing urethra with no masses, tenderness or lesions              Bartholins and Skenes: normal                 Vagina: normal appearing vagina with normal color and  discharge, no lesions              Cervix:  absent              Pap and high risk HPV testing done: No.  Bimanual Exam:  Uterus:   absent                                      Adnexa: normal adnexa in size, nontender and no masses, bowel loops palpable at top of vagina (normal pelvic ultrasound last year).                                      Rectovaginal:  Yes.                                        Confirms above.                                      Anus:  normal sphincter tone, no lesions  ASSESSMENT  Normal gynecologic exam. New diagnosis of atrial fibrillation.   Status post cardiac ablation.  Periodic symptoms of palpitations.  Off all ERT.  Microscopic hematuria.  Recent UTI.   PLAN  Mammogram recommended yearly starting at age 3. Pap smear and high risk HPV testing as above. Counseled on self breast exam, Calcium and vitamin D intake, exercise. Patient will call her cardiologist for re-evaluation.  See lab orders: Yes.   Urine culture done.  Return annually or prn   An After Visit Summary was printed and given to the patient.

## 2014-04-27 NOTE — Patient Instructions (Signed)

## 2014-04-28 ENCOUNTER — Other Ambulatory Visit: Payer: Self-pay | Admitting: Obstetrics and Gynecology

## 2014-04-28 ENCOUNTER — Telehealth: Payer: Self-pay

## 2014-04-28 MED ORDER — CIPROFLOXACIN HCL 500 MG PO TABS: 500.0000 mg | ORAL_TABLET | Freq: Two times a day (BID) | ORAL | Status: DC

## 2014-04-28 NOTE — Telephone Encounter (Signed)
Message copied by Jasmine Awe on Tue Apr 28, 2014  4:10 PM ------      Message from: [place]      Created: Tue Apr 28, 2014  3:15 PM       Please report results of urine testing showing urinary tract infection with E Coli.  This is what she had with her last urine culture as well.            I would like to start the patient on Ciprofloxacin 500 mg po bid for 7 days and then plan for a test of cure in 10 - 14 days.  I will send this to her pharmacy.             Final sensitivities are pending but may not be ready until tomorrow or the next day.  We will contact her when the sensitivities return.  The Ciprofloxacin is a good antibiotic based on the prior sensitivity testing with the last culture result. ------

## 2014-04-28 NOTE — Telephone Encounter (Signed)
Spoke with patient. Advised of message and results as seen below from [place]. Patient is agreeable and verbalizes understanding. TOC scheduled for 10/5 at 2pm. Agreeable to date and time.  Routing to provider for final review. Patient agreeable to disposition. Will close encounter

## 2014-04-29 LAB — URINE CULTURE: Colony Count: >=100000

## 2014-04-30 ENCOUNTER — Telehealth: Payer: Self-pay

## 2014-04-30 NOTE — Telephone Encounter (Signed)
Message copied by Jasmine Awe on Thu Apr 30, 2014 10:21 AM ------      Message from: [place], [place]: Wed Apr 29, 2014 12:53 PM       Please report to patient that the Cipro is a good choice for her UTI.       Also sent result by My Chart. ------

## 2014-04-30 NOTE — Telephone Encounter (Signed)
Spoke with patient. Advised of message as seen below. Patient is agreeable and verbalizes understanding.  Routing to provider for final review. Patient agreeable to disposition. Will close encounter

## 2014-05-06 ENCOUNTER — Telehealth: Payer: Self-pay | Admitting: Internal Medicine

## 2014-05-06 NOTE — Telephone Encounter (Signed)
New Message  Pt called states that her PCP Dr. Inda Merlin wants her to speak with Dr. Rayann Heman as it pertains to her Palpitations.. Just to call and talk to the nurse or doctor about symptoms.. Please call back to discuss.

## 2014-05-06 NOTE — Telephone Encounter (Signed)
Called patient back and about 2 weeks ago she was having palpitations and dizziness.  It occurred every day for a week 10-15 times daily.  Then it just stopped and was gone for 3 days and then started back up again and only lasted for 2 days.  She saw Dr Inda Merlin and told him about it and he wanted her to call us to discuss.  All labs including TSH from Dr Inda Merlin were normal.

## 2014-05-11 ENCOUNTER — Ambulatory Visit (INDEPENDENT_AMBULATORY_CARE_PROVIDER_SITE_OTHER): Payer: Commercial Managed Care - HMO | Admitting: *Deleted

## 2014-05-11 VITALS — BP 92/70 | HR 68 | Temp 97.8°F | Resp 18 | Wt 117.0 lb

## 2014-05-11 DIAGNOSIS — R312 Other microscopic hematuria: Secondary | ICD-10-CM

## 2014-05-11 DIAGNOSIS — R3129 Other microscopic hematuria: Secondary | ICD-10-CM

## 2014-05-11 NOTE — Telephone Encounter (Signed)
Discussed with Dr Rayann Heman and she can come in on Wed.  Called patient and let her know his thoughts.  She is betterand will hold off for now being seen.  She will call if her symptoms get worse and become more frequent

## 2014-05-11 NOTE — Progress Notes (Addendum)
Patient in today for TOC. Patient states she finished Abx and states she still having occasional pressure.  Advise pt to call if sx get worse. We will call her by the end of the week for TOC results.- Pt Agreed.

## 2014-05-12 LAB — URINALYSIS, MICROSCOPIC ONLY
Bacteria, UA: NONE SEEN
CASTS: NONE SEEN
Crystals: NONE SEEN
SQUAMOUS EPITHELIAL / LPF: NONE SEEN

## 2014-05-13 LAB — URINE CULTURE
Colony Count: NO GROWTH
ORGANISM ID, BACTERIA: NO GROWTH

## 2014-05-22 ENCOUNTER — Other Ambulatory Visit: Payer: Self-pay

## 2014-06-07 ENCOUNTER — Ambulatory Visit (INDEPENDENT_AMBULATORY_CARE_PROVIDER_SITE_OTHER): Payer: Commercial Managed Care - HMO | Admitting: Emergency Medicine

## 2014-06-07 VITALS — BP 100/70 | HR 69 | Temp 98.7°F | Resp 16 | Ht 63.0 in | Wt 117.0 lb

## 2014-06-07 DIAGNOSIS — S61219A Laceration without foreign body of unspecified finger without damage to nail, initial encounter: Secondary | ICD-10-CM

## 2014-06-07 DIAGNOSIS — S61213A Laceration without foreign body of left middle finger without damage to nail, initial encounter: Secondary | ICD-10-CM

## 2014-06-07 DIAGNOSIS — M79645 Pain in left finger(s): Secondary | ICD-10-CM

## 2014-06-07 MED ORDER — TRETINOIN 0.05 % EX CREA: TOPICAL_CREAM | Freq: Every day | CUTANEOUS | Status: DC

## 2014-06-07 NOTE — Patient Instructions (Signed)

## 2014-06-07 NOTE — Progress Notes (Signed)
[facility] [address] [place] [id number] [phone]  Date:  06/07/2014   Name:  Terri [name]   DOB:  10/19/1950   MRN:  [id number]  PCP:  [name], MD    Chief Complaint: Laceration   History of Present Illness:  Terri [name] is a 63 y.o. very pleasant female patient who presents with the following:  Laceration left third finger this morning. Current on tetanus (2011) Pain in finger No improvement with over the counter medications or other home remedies.  Denies other complaint or health concern today.   Patient Active Problem List   Diagnosis Date Noted  . Atrial fibrillation 12/23/2013  . Atrial flutter 11/05/2013  . Paroxysmal atrial fibrillation 08/28/2013  . Palpitations 08/28/2013  . Dizziness 08/28/2013    Past Medical History  Diagnosis Date  . Depression   . Hypothyroidism   . Dyspareunia   . Fibroid   . Arthritis     lt shoulder  . Paroxysmal atrial fibrillation   . Atrial flutter     Past Surgical History  Procedure Laterality Date  . Abdominal hysterectomy    . Breast enhancement surgery    . Fracture surgery Right     wrist  . Cosmetic surgery    . Augmentation mammaplasty    . Flexible sigmoidoscopy N/A 07/29/2013    Procedure: FLEXIBLE SIGMOIDOSCOPY;  Surgeon: [name], MD;  Location: [facility] ENDOSCOPY;  Service: Endoscopy;  Laterality: N/A;  . Tee without cardioversion N/A 12/22/2013    Procedure: TRANSESOPHAGEAL ECHOCARDIOGRAM (TEE);  Surgeon: [name], MD;  Location: [facility] ENDOSCOPY;  Service: Cardiovascular;  Laterality: N/A;  . Ablation  12-23-2013    PVI and CTI by Dr Rayann Heman    History  Substance Use Topics  . Smoking status: Never Smoker   . Smokeless tobacco: Never Used  . Alcohol Use: 4.2 oz/week    7 Glasses of wine per week     Comment: 7 glasses of wine per week    Family History  Problem Relation Age of Onset  . Cancer Mother   . Hypertension Mother   . Thyroid  disease Mother   . Cancer Paternal Grandmother   . Breast cancer Paternal Grandmother   . Thyroid disease Sister   . Stroke Maternal Grandmother     No Known Allergies  Medication list has been reviewed and updated.  Current Outpatient Prescriptions on File Prior to Visit  Medication Sig Dispense Refill  . aspirin 81 MG tablet Take 81 mg by mouth daily.    . Biotin 5000 MCG CAPS Take 1 capsule by mouth daily.    . Calcium Carbonate-Vit D-Min (CALCIUM 1200 PO) Take 600 mg elemental calcium/kg/hr by mouth daily.     . ciprofloxacin (CIPRO) 500 MG tablet Take 1 tablet (500 mg total) by mouth 2 (two) times daily. 14 tablet 0  . fish oil-omega-3 fatty acids 1000 MG capsule Take 1 g by mouth daily.    Marland Kitchen levothyroxine (SYNTHROID, LEVOTHROID) 50 MCG tablet Take 50 mcg by mouth daily.    . magnesium gluconate (MAGONATE) 500 MG tablet Take 250 mg by mouth 2 (two) times daily.    . Multiple Vitamin (MULTIVITAMIN) capsule Take 1 capsule by mouth daily.    . sertraline (ZOLOFT) 100 MG tablet Take 150 mg by mouth daily.     No current facility-administered medications on file prior to visit.    Review of Systems:  As per HPI, otherwise  negative.    Physical Examination: Filed Vitals:   06/07/14 0834  BP: 100/70  Pulse: 69  Temp: 98.7 F (37.1 C)  Resp: 16   Filed Vitals:   06/07/14 0834  Height: 5\' 3"  (1.6 m)  Weight: 117 lb (53.071 kg)   Body mass index is 20.73 kg/(m^2). Ideal Body Weight: Weight in (lb) to have BMI = 25: 140.8   GEN: WDWN, NAD, Non-toxic, Alert & Oriented x 3 HEENT: Atraumatic, Normocephalic.  Ears and Nose: No external deformity. EXTR: No clubbing/cyanosis/edema NEURO: Normal gait.  PSYCH: Normally interactive. Conversant. Not depressed or anxious appearing.  Calm demeanor.  LEFT third finger distal based flap laceration terminal phalange.   Assessment and Plan: Laceration finger  Pain in finger Suture repair Signed,  Ellison Carwin, MD

## 2014-06-14 ENCOUNTER — Ambulatory Visit (INDEPENDENT_AMBULATORY_CARE_PROVIDER_SITE_OTHER): Payer: Commercial Managed Care - HMO | Admitting: Physician Assistant

## 2014-06-14 VITALS — BP 102/74 | HR 66 | Temp 97.8°F | Resp 16 | Ht 63.0 in | Wt 117.0 lb

## 2014-06-14 DIAGNOSIS — S61219D Laceration without foreign body of unspecified finger without damage to nail, subsequent encounter: Secondary | ICD-10-CM

## 2014-06-14 NOTE — Progress Notes (Signed)
   Subjective:    Patient ID: Terri [name], female    DOB: [birth year], 63 y.o.   MRN: [id number]  HPI Pt presents to clinic for her sutures to be removed.  She has been keeping a bandaid on the wound.  She still has local pain but it is improving.  She takes care of her 6 month old grandson.   Review of Systems  Constitutional: Negative for fever and chills.  Skin: Positive for wound.       Objective:   Physical Exam  Constitutional: She is oriented to person, place, and time. She appears well-developed and well-nourished.  HENT:  Head: Normocephalic and atraumatic.  Right Ear: External ear normal.  Left Ear: External ear normal.  Pulmonary/Chest: Effort normal.  Neurological: She is alert and oriented to person, place, and time.  Skin: Skin is warm.  Left 3rd digit - drsg removed and skin is intact but white from wetness.  The wound is not healed at the surface.  1 stitch was removed but the wound started to dehisce.   Psychiatric: She has a normal mood and affect. Her behavior is normal. Judgment and thought content normal.  Vitals reviewed.      Assessment & Plan:  Laceration of finger, subsequent encounter   Wound is macerated due to wet drsg and therefore the stitches are not ready to remove.  She will RTC in 5 days but keep the wound clean and dry until then.  She can keep it covered if she would like but the drsg must remain dry (so it should be changed often).  She is not to put any ointments or creams on the area.  Windell Hummingbird PA-C  [facility] 06/14/2014 10:30 AM

## 2014-06-15 NOTE — Progress Notes (Addendum)
Procedure Note: Consent obtained. 2% Lidocaine applied for metacarpal block, then applied 1% lidocaine locally to wound site.  Wound cleaned with soap and water.  Used 5-0 ethilon for 4 simple interrupted sutures.  Dressings applied.

## 2014-06-17 ENCOUNTER — Ambulatory Visit (INDEPENDENT_AMBULATORY_CARE_PROVIDER_SITE_OTHER): Payer: Commercial Managed Care - HMO | Admitting: Physician Assistant

## 2014-06-17 VITALS — BP 102/70 | HR 70 | Temp 98.0°F | Resp 16 | Ht 63.0 in | Wt 118.0 lb

## 2014-06-17 DIAGNOSIS — S61219D Laceration without foreign body of unspecified finger without damage to nail, subsequent encounter: Secondary | ICD-10-CM

## 2014-06-17 NOTE — Progress Notes (Signed)
   Subjective:    Patient ID: Terri [name], female    DOB: 1950/11/21, 63 y.o.   MRN: [id number]  HPI Patient presents status post day 10 of suturing of laceration on middle finger of left hand. Denies pain and fever.    Review of Systems  Constitutional: Negative for fever.  Skin: Positive for wound.       Objective:   Physical Exam  Constitutional: She appears well-developed and well-nourished. No distress.  Blood pressure 102/70, pulse 70, temperature 98 F (36.7 C), temperature source Oral, resp. rate 16, height 5\' 3"  (1.6 m), weight 118 lb (53.524 kg), SpO2 98 %.   HENT:  Head: Normocephalic and atraumatic.  Right Ear: External ear normal.  Left Ear: External ear normal.  Skin: Skin is warm and dry. No rash noted. She is not diaphoretic. No erythema. No pallor.  Wound intact. 3 sutures in place.    Procedure Consent obtained. #3 sutures removed. Wound completely closed. Procedure tolerated.     Assessment & Plan:  1. Laceration of finger, subsequent encounter Wound healed well.    Alveta Heimlich PA-C  [facility] 06/17/2014 1:04 PM

## 2014-06-18 NOTE — Progress Notes (Signed)
Reviewed and agree.

## 2014-06-29 ENCOUNTER — Encounter: Payer: Self-pay | Admitting: Internal Medicine

## 2014-06-29 ENCOUNTER — Ambulatory Visit (INDEPENDENT_AMBULATORY_CARE_PROVIDER_SITE_OTHER): Payer: Commercial Managed Care - HMO | Admitting: Internal Medicine

## 2014-06-29 VITALS — BP 100/60 | HR 60 | Ht 63.0 in | Wt 115.1 lb

## 2014-06-29 DIAGNOSIS — I4819 Other persistent atrial fibrillation: Secondary | ICD-10-CM

## 2014-06-29 DIAGNOSIS — I481 Persistent atrial fibrillation: Secondary | ICD-10-CM

## 2014-06-29 NOTE — Patient Instructions (Signed)
Your physician wants you to follow-up in: 6 months with Roderic Palau, NP in the afib clinic at the hospital You will receive a reminder letter in the mail two months in advance. If you don't receive a letter, please call our office to schedule the follow-up appointment.

## 2014-06-29 NOTE — Progress Notes (Signed)
       PCP: [name], MD Primary Cardiologist:  [name] is a 63 y.o. female who presents today for routine electrophysiology followup.  Since her recent ablation 5/15, the patient reports one episode of irregular heart beat x 2-3 days, 6-8 weeks ago. Otherwise,  No further arrhythmia.   Today, she denies symptoms of palpitations, chest pain, shortness of breath,  lower extremity edema, dizziness, presyncope, or syncope.  The patient is otherwise without complaint today.   Past Medical History  Diagnosis Date  . Depression   . Hypothyroidism   . Dyspareunia   . Fibroid   . Arthritis     lt shoulder  . Paroxysmal atrial fibrillation   . Atrial flutter    Past Surgical History  Procedure Laterality Date  . Abdominal hysterectomy    . Breast enhancement surgery    . Fracture surgery Right     wrist  . Cosmetic surgery    . Augmentation mammaplasty    . Flexible sigmoidoscopy N/A 07/29/2013    Procedure: FLEXIBLE SIGMOIDOSCOPY;  Surgeon: [name], MD;  Location: [facility] ENDOSCOPY;  Service: Endoscopy;  Laterality: N/A;  . Tee without cardioversion N/A 12/22/2013    Procedure: TRANSESOPHAGEAL ECHOCARDIOGRAM (TEE);  Surgeon: [name], MD;  Location: [facility] ENDOSCOPY;  Service: Cardiovascular;  Laterality: N/A;  . Ablation  12-23-2013    PVI and CTI by Dr Rayann Heman    ROS- all systems are reviewed and negatives except as per HPI above  Current Outpatient Prescriptions  Medication Sig Dispense Refill  . aspirin 81 MG tablet Take 81 mg by mouth daily.    . Biotin 5000 MCG CAPS Take 1 capsule by mouth daily.    . Calcium Carbonate-Vit D-Min (CALCIUM 1200 PO) Take 600 mg elemental calcium/kg/hr by mouth daily.     . Cholecalciferol (VITAMIN D3) 5000 UNITS CAPS Take 1 capsule by mouth daily.    . fish oil-omega-3 fatty acids 1000 MG capsule Take 1 g by mouth daily.    Marland Kitchen levothyroxine (SYNTHROID, LEVOTHROID) 50 MCG tablet Take 50 mcg by mouth daily.      . magnesium gluconate (MAGONATE) 500 MG tablet Take 250 mg by mouth 2 (two) times daily.    . Multiple Vitamin (MULTIVITAMIN) capsule Take 1 capsule by mouth daily.    . sertraline (ZOLOFT) 100 MG tablet Take 150 mg by mouth daily.    Marland Kitchen tretinoin (RETIN-A) 0.05 % cream Apply topically at bedtime. 45 g 2   No current facility-administered medications for this visit.    Physical Exam: Filed Vitals:   06/29/14 1212  BP: 100/60  Pulse: 60  Height: 5\' 3"  (1.6 m)  Weight: 115 lb 1.9 oz (52.218 kg)    GEN- The patient is well appearing, alert and oriented x 3 today.   Head- normocephalic, atraumatic Eyes-  Sclera clear, conjunctiva pink Ears- hearing intact Oropharynx- clear Lungs- Clear to ausculation bilaterally, normal work of breathing Heart- Regular rate and rhythm, no murmurs, rubs or gallops, PMI not laterally displaced GI- soft, NT, ND, + BS Extremities- no clubbing, cyanosis, or edema  ekg today reveals sinus rhythm,normal ekg, 60 bpm  Assessment and Plan:  1. Afib/ atrial flutter Doing well s/p ablation, off AAD therapy chads2vasc score is 2.  She is clear in her decision to stop anticoagulation at this time  Return in 6 months afib clinic.

## 2014-07-16 ENCOUNTER — Encounter (HOSPITAL_COMMUNITY): Payer: Self-pay | Admitting: Internal Medicine

## 2014-10-19 ENCOUNTER — Other Ambulatory Visit: Payer: Self-pay

## 2014-10-19 DIAGNOSIS — Z9882 Breast implant status: Secondary | ICD-10-CM

## 2014-10-19 DIAGNOSIS — Z1231 Encounter for screening mammogram for malignant neoplasm of breast: Secondary | ICD-10-CM

## 2014-12-04 ENCOUNTER — Ambulatory Visit
Admission: RE | Admit: 2014-12-04 | Discharge: 2014-12-04 | Disposition: A | Discharge status: Home / Self Care | Payer: PPO | Source: Ambulatory Visit

## 2014-12-04 DIAGNOSIS — Z1231 Encounter for screening mammogram for malignant neoplasm of breast: Secondary | ICD-10-CM

## 2014-12-04 DIAGNOSIS — Z9882 Breast implant status: Secondary | ICD-10-CM

## 2015-02-01 ENCOUNTER — Other Ambulatory Visit: Payer: Self-pay

## 2015-03-18 ENCOUNTER — Encounter: Payer: Self-pay | Admitting: Cardiology

## 2015-03-18 ENCOUNTER — Ambulatory Visit (INDEPENDENT_AMBULATORY_CARE_PROVIDER_SITE_OTHER): Payer: PPO | Admitting: Cardiology

## 2015-03-18 VITALS — BP 104/68 | HR 68 | Ht 63.0 in | Wt 114.4 lb

## 2015-03-18 DIAGNOSIS — I4891 Unspecified atrial fibrillation: Secondary | ICD-10-CM | POA: Diagnosis not present

## 2015-03-18 DIAGNOSIS — Z5181 Encounter for therapeutic drug level monitoring: Secondary | ICD-10-CM | POA: Diagnosis not present

## 2015-03-18 DIAGNOSIS — Z01810 Encounter for preprocedural cardiovascular examination: Secondary | ICD-10-CM

## 2015-03-18 DIAGNOSIS — I480 Paroxysmal atrial fibrillation: Secondary | ICD-10-CM

## 2015-03-18 DIAGNOSIS — Z7901 Long term (current) use of anticoagulants: Secondary | ICD-10-CM

## 2015-03-18 MED ORDER — FLECAINIDE ACETATE 100 MG PO TABS
100.0000 mg | ORAL_TABLET | Freq: Two times a day (BID) | ORAL | Status: DC
Start: 2015-03-18 — End: 2015-05-26

## 2015-03-18 MED ORDER — APIXABAN 5 MG PO TABS: 5.0000 mg | ORAL_TABLET | Freq: Two times a day (BID) | ORAL | Status: DC

## 2015-03-18 NOTE — Patient Instructions (Signed)
Medication Instructions:   STOP TAKING ASPIRIN NOW  START TAKING ELIQUIS 5 MG TWICE DAILY  START TAKING FLECAINIDE 100 MG TWICE DAILY     Testing/Procedures:  Your physician has requested that you have an exercise tolerance test. For further information please visit [email or web address]. Please also follow instruction sheet, as given.   THIS GXT MUST BE SCHEDULED FOR ONE WEEK OUT PER POLICY OF STARTING FLECAINIDE AND PER DR NELSON    Follow-Up:  2 MONTHS WITH DR Rayann Heman

## 2015-03-18 NOTE — Progress Notes (Signed)
Patient ID: Terri [name], female   DOB: Jun 25, 1951, 64 y.o.   MRN: [id number]     Patient Name: Terri [name] Date of Encounter: 03/18/2015  Primary Care Provider:  [name], MD Primary Cardiologist:  Dorothy Spark  Problem List   Past Medical History  Diagnosis Date  . Depression   . Hypothyroidism   . Dyspareunia   . Fibroid   . Arthritis     lt shoulder  . Paroxysmal atrial fibrillation   . Atrial flutter    Past Surgical History  Procedure Laterality Date  . Abdominal hysterectomy    . Breast enhancement surgery    . Fracture surgery Right     wrist  . Cosmetic surgery    . Augmentation mammaplasty    . Flexible sigmoidoscopy N/A 07/29/2013    Procedure: FLEXIBLE SIGMOIDOSCOPY;  Surgeon: [name], MD;  Location: [facility] ENDOSCOPY;  Service: Endoscopy;  Laterality: N/A;  . Tee without cardioversion N/A 12/22/2013    Procedure: TRANSESOPHAGEAL ECHOCARDIOGRAM (TEE);  Surgeon: [name], MD;  Location: [facility] ENDOSCOPY;  Service: Cardiovascular;  Laterality: N/A;  . Ablation  12-23-2013    PVI and CTI by Dr Rayann Heman  . Atrial fibrillation ablation N/A 12/23/2013    Procedure: ATRIAL FIBRILLATION ABLATION;  Surgeon: Coralyn Mark, MD;  Location: [place] CATH LAB;  Service: Cardiovascular;  Laterality: N/A;   Allergies  No Known Allergies  Chief complain: Palpitations  HPI  A very pleasant 64 year old female with no significant past medical history who has been very active throughout her life and continues exercising 5-6 times a week for about an hour daily. The patient started to have palpitations associated with shortness of breath and dizziness that start few minutes after she stops exercising and usually last for a few minutes when she has to sit down. On few occasions she had presyncopal episodes. She didn't tolerate any AVN blocking agents and underwent RF ablation by Dr Rayann Heman in May 2015. She was doing great for about a year, but had recurrence in  palpitations about 2 months ago and they have been progressively worsening - in frequency and symptoms, currently occuring daily and with presyncope yesterday.  She denies CP, SOB, DOE, she exercises daily and gets palpitations post exercise.    Home Medications  Prior to Admission medications   Medication Sig Start Date End Date Taking? Authorizing Provider  Biotin 5000 MCG CAPS Take 1 capsule by mouth daily.   Yes Historical Provider, MD  estrogens, conjugated, (PREMARIN) 0.45 MG tablet Take 0.45 mg by mouth every other day. 04/25/13  Yes Clayton Berton Lan, MD  fish oil-omega-3 fatty acids 1000 MG capsule Take 1 g by mouth daily.   Yes Historical Provider, MD  levothyroxine (SYNTHROID, LEVOTHROID) 50 MCG tablet Take 50 mcg by mouth daily.   Yes Historical Provider, MD  magnesium gluconate (MAGONATE) 500 MG tablet Take 500 mg by mouth daily.   Yes Historical Provider, MD  sertraline (ZOLOFT) 100 MG tablet Take 150 mg by mouth daily.   Yes Historical Provider, MD    Family History  Family History  Problem Relation Age of Onset  . Cancer Mother   . Hypertension Mother   . Thyroid disease Mother   . Cancer Paternal Grandmother   . Breast cancer Paternal Grandmother   . Thyroid disease Sister   . Stroke Maternal Grandmother     Social History  Social History   Social History  . Marital  Status: Married    Spouse Name: N/A  . Number of Children: N/A  . Years of Education: N/A   Occupational History  . Not on file.   Social History Main Topics  . Smoking status: Never Smoker   . Smokeless tobacco: Never Used  . Alcohol Use: 4.2 oz/week    7 Glasses of wine per week     Comment: 7 glasses of wine per week  . Drug Use: No  . Sexual Activity:    Partners: Male    Birth Control/ Protection: None, Surgical     Comment: TVH--still has ovaries   Other Topics Concern  . Not on file   Social History Narrative   Pt lives in [place] with spouse.  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     Review of Systems, as per HPI, otherwise negative General:  No chills, fever, night sweats or weight changes.  Cardiovascular:  No chest pain, dyspnea on exertion, edema, orthopnea, palpitations, paroxysmal nocturnal dyspnea. Dermatological: No rash, lesions/masses Respiratory: No cough, dyspnea Urologic: No hematuria, dysuria Abdominal:   No nausea, vomiting, diarrhea, bright red blood per rectum, melena, or hematemesis Neurologic:  No visual changes, wkns, changes in mental status. All other systems reviewed and are otherwise negative except as noted above.  Physical Exam  Blood pressure 104/68, pulse 68, height 5\' 3"  (1.6 m), weight 114 lb 6.4 oz (51.891 kg).  General: Pleasant, NAD Psych: Normal affect. Neuro: Alert and oriented X 3. Moves all extremities spontaneously. HEENT: Normal  Neck: Supple without bruits or JVD. Lungs:  Resp regular and unlabored, CTA. Heart: RRR no s3, s4, or murmurs. Abdomen: Soft, non-tender, non-distended, BS + x 4.  Extremities: No clubbing, cyanosis or edema. DP/PT/Radials 2+ and equal bilaterally.  Labs:  No results for input(s): CKTOTAL, CKMB, TROPONINI in the last 72 hours. Lab Results  Component Value Date   WBC 5.7 12/16/2013   HGB 12.7 12/16/2013   HCT 37.5 12/16/2013   MCV 96.6 12/16/2013   PLT 191.0 12/16/2013   No results for input(s): NA, K, CL, CO2, BUN, CREATININE, CALCIUM, PROT, BILITOT, ALKPHOS, ALT, AST, GLUCOSE in the last 168 hours.  Invalid input(s): LABALBU Lab Results  Component Value Date   CHOL 250* 09/22/2013   No results found for: DDIMER Invalid input(s): POCBNP  Accessory Clinical Findings  Echocardiogram - 09/18/2013 Study Conclusions  - Left ventricle: The cavity size was normal. Systolic function was normal. The estimated ejection fraction was in the range of 55% to 60%. Wall motion was normal; there were no regional wall motion abnormalities. - Aortic valve:  Trivial regurgitation. - Right atrium: The atrium was mildly dilated. - Pericardium, extracardiac: A small, free-flowing pericardial effusion was identified circumferential to the heart. The fluid had no internal echoes.There was no evidence of hemodynamic compromise.  ECG - sinus bradycardia with sinus arrhythmia, 56 beats per minute, otherwise normal EKG    Assessment & Plan  64 year old female  1. Paroxysmal atrial fibrillation -  S/p RF ablation in 5/15, now recurrence, we will restart anticoagulation - start Eliquis 5 mg po BID. Her baseline ECG is normal with normal QRS and QT intervals, we will start Flecainide 100 mg po BID and perform an exercise treadmill stress test within the next 5 days.   We will refer her to Dr Rayann Heman for further management.  2. Preop evaluation for a left shoulder surgery - we will fax a letter after her stress test is negative, also she will need  to stop Eliquis a day prior to the surgery and restart as soon as acceptable from bleeding standpoint.   All labs including TSH were normal. Patient CHADS-VASc 1 (for sex).   Follow up with Dr Rayann Heman.   Dorothy Spark, MD, [facility] 03/18/2015, 10:56 AM

## 2015-03-25 ENCOUNTER — Ambulatory Visit (HOSPITAL_COMMUNITY)
Admission: RE | Admit: 2015-03-25 | Discharge: 2015-03-25 | Disposition: A | Discharge status: Home / Self Care | Payer: PPO | Source: Ambulatory Visit | Attending: Cardiology | Admitting: Cardiology

## 2015-03-25 DIAGNOSIS — I4891 Unspecified atrial fibrillation: Secondary | ICD-10-CM | POA: Diagnosis not present

## 2015-03-25 DIAGNOSIS — I480 Paroxysmal atrial fibrillation: Secondary | ICD-10-CM | POA: Insufficient documentation

## 2015-03-25 NOTE — Progress Notes (Signed)
'     Patient presented for ETT after initiation of flecainide for atrial fib/flutter 1 week ago. Also to r/o ischemia prior to upcoming shoulder surgery.   Baseline ECG NSR HR 80; BP 94/62 mm Hg.  She exceeded target HR of 130. Her peak HR was 148. She walked 11 min 3 seconds on Bruce protocol with excellent exercise tolerance. Her HR and BP responded appropriately to exercise and recovery. She had no chest pain or palpitations. No ST segment changes worrisome for ischemia. No arrhythmias noted.   Will be formally read by MD but i suspect she will be okay to continue flecainide and cleared for upcoming shoulder surgery.    Angelena Form PA-C  [facility]

## 2015-03-31 ENCOUNTER — Encounter: Payer: Self-pay | Admitting: *Deleted

## 2015-04-06 NOTE — H&P (Signed)
Terri [name] is an 64 y.o. female.    Chief Complaint: left shoulder pain  HPI: Pt is a 64 y.o. female complaining of left shoulder pain for multiple years. Pain had continually increased since the beginning. X-rays in the clinic show end-stage arthritic changes of the left shoulder. Pt has tried various conservative treatments which have failed to alleviate their symptoms, including injections and therapy. Various options are discussed with the patient. Risks, benefits and expectations were discussed with the patient. Patient understand the risks, benefits and expectations and wishes to proceed with surgery.   PCP:  [name], MD  D/C Plans: Home  PMH: Past Medical History  Diagnosis Date  . Depression   . Hypothyroidism   . Dyspareunia   . Fibroid   . Arthritis     lt shoulder  . Paroxysmal atrial fibrillation   . Atrial flutter     PSH: Past Surgical History  Procedure Laterality Date  . Abdominal hysterectomy    . Breast enhancement surgery    . Fracture surgery Right     wrist  . Cosmetic surgery    . Augmentation mammaplasty    . Flexible sigmoidoscopy N/A 07/29/2013    Procedure: FLEXIBLE SIGMOIDOSCOPY;  Surgeon: [name], MD;  Location: [facility] ENDOSCOPY;  Service: Endoscopy;  Laterality: N/A;  . Tee without cardioversion N/A 12/22/2013    Procedure: TRANSESOPHAGEAL ECHOCARDIOGRAM (TEE);  Surgeon: [name], MD;  Location[facility] ENDOSCOPY;  Service: Cardiovascular;  Laterality: N/A;  . Ablation  12-23-2013    PVI and CTI by Dr Rayann Heman  . Atrial fibrillation ablation N/A 12/23/2013    Procedure: ATRIAL FIBRILLATION ABLATION;  Surgeon: Coralyn Mark, MD;  Location: [place] CATH LAB;  Service: Cardiovascular;  Laterality: N/A;    Social History:  reports that she has never smoked. She has never used smokeless tobacco. She reports that she drinks about 4.2 oz of alcohol per week. She reports that she does not use illicit drugs.  Allergies:  No Known  Allergies  Medications: No current facility-administered medications for this encounter.   Current Outpatient Prescriptions  Medication Sig Dispense Refill  . apixaban (ELIQUIS) 5 MG TABS tablet Take 1 tablet (5 mg total) by mouth 2 (two) times daily. 180 tablet 3  . Biotin 5000 MCG CAPS Take 1 capsule by mouth daily.    . Cholecalciferol (VITAMIN D3) 5000 UNITS CAPS Take 1 capsule by mouth daily.    . fish oil-omega-3 fatty acids 1000 MG capsule Take 1 g by mouth daily.    . flecainide (TAMBOCOR) 100 MG tablet Take 1 tablet (100 mg total) by mouth 2 (two) times daily. 180 tablet 3  . levothyroxine (SYNTHROID, LEVOTHROID) 50 MCG tablet Take 50 mcg by mouth daily.    . Multiple Vitamin (MULTIVITAMIN) capsule Take 1 capsule by mouth daily.    . sertraline (ZOLOFT) 100 MG tablet Take 150 mg by mouth daily.    . traZODone (DESYREL) 50 MG tablet Take 25 mg by mouth at bedtime.    . tretinoin (RETIN-A) 0.05 % cream Apply topically at bedtime. 45 g 2    No results found for this or any previous visit (from the past 48 hour(s)). No results found.  ROS: Pain with rom of the left upper extremity  Physical Exam:  Alert and oriented 64 y.o. female in no acute distress Cranial nerves 2-12 intact Cervical spine: full rom with no tenderness, nv intact distally Chest: active breath sounds bilaterally, no wheeze rhonchi or  rales Heart: regular rate and rhythm, no murmur Abd: non tender non distended with active bowel sounds Hip is stable with rom  Left shoulder with crepitus with rom nv intact distally Strength of ER and IR 4/5 bilaterally No rashes   Assessment/Plan Assessment: left shoulder end stage osteoarthritis  Plan: Patient will undergo a left total shoulder arthroplasty by Dr. Veverly Fells at [facility]. Risks benefits and expectations were discussed with the patient. Patient understand risks, benefits and expectations and wishes to proceed.

## 2015-04-14 ENCOUNTER — Other Ambulatory Visit (HOSPITAL_COMMUNITY): Payer: Self-pay | Admitting: *Deleted

## 2015-04-14 ENCOUNTER — Encounter (HOSPITAL_COMMUNITY): Payer: Self-pay

## 2015-04-14 ENCOUNTER — Encounter (HOSPITAL_COMMUNITY)
Admission: RE | Admit: 2015-04-14 | Discharge: 2015-04-14 | Disposition: A | Discharge status: Home / Self Care | Payer: PPO | Source: Ambulatory Visit | Attending: Orthopedic Surgery | Admitting: Orthopedic Surgery

## 2015-04-14 DIAGNOSIS — M19012 Primary osteoarthritis, left shoulder: Secondary | ICD-10-CM | POA: Insufficient documentation

## 2015-04-14 DIAGNOSIS — Z01812 Encounter for preprocedural laboratory examination: Secondary | ICD-10-CM | POA: Insufficient documentation

## 2015-04-14 HISTORY — DX: Cardiac arrhythmia, unspecified: I49.9

## 2015-04-14 HISTORY — DX: Cardiac murmur, unspecified: R01.1

## 2015-04-14 LAB — BASIC METABOLIC PANEL
ANION GAP: 8 (ref 5–15)
BUN: 10 mg/dL (ref 6–20)
CHLORIDE: 101 mmol/L (ref 101–111)
CO2: 29 mmol/L (ref 22–32)
Calcium: 9.7 mg/dL (ref 8.9–10.3)
Creatinine, Ser: 0.71 mg/dL (ref 0.44–1.00)
GFR calc non Af Amer: >60 mL/min (ref >60)
Glucose, Bld: 90 mg/dL (ref 65–99)
POTASSIUM: 4.3 mmol/L (ref 3.5–5.1)
SODIUM: 138 mmol/L (ref 135–145)

## 2015-04-14 LAB — SURGICAL PCR SCREEN
MRSA, PCR: NEGATIVE
Staphylococcus aureus: NEGATIVE

## 2015-04-14 LAB — CBC
HEMATOCRIT: 39.2 % (ref 36.0–46.0)
Hemoglobin: 13.1 g/dL (ref 12.0–15.0)
MCH: 31.4 pg (ref 26.0–34.0)
MCHC: 33.4 g/dL (ref 30.0–36.0)
MCV: 94 fL (ref 78.0–100.0)
Platelets: 193 10*3/uL (ref 150–400)
RBC: 4.17 MIL/uL (ref 3.87–5.11)
RDW: 13.3 % (ref 11.5–15.5)
WBC: 8.7 10*3/uL (ref 4.0–10.5)

## 2015-04-14 LAB — PROTIME-INR
INR: 1.03 (ref 0.00–1.49)
PROTHROMBIN TIME: 13.7 s (ref 11.6–15.2)

## 2015-04-14 NOTE — Pre-Procedure Instructions (Addendum)
Terri [name]  04/14/2015      [facility] - [place], [address] [place] [address] Phone: [phone] Fax: [phone]  [place], [place] - [address] [place] [address] Phone: [phone] Fax: [phone]    Your procedure is scheduled on Friday, April 23, 2015   Report to [facility] Entrance "A" Admitting Office at 5:30 AM.  Call this number if you have problems the morning of surgery: [phone]  Any questions prior to day of surgery, please call [phone] between 8 & 4 PM.   Remember:  Do not eat food or drink liquids after midnight Thursday, 04/22/15.  Take these medicines the morning of surgery with A SIP OF WATER: Flecainide (Tambocor), Levothyroxine (Synthroid),  Tylenol - if needed.  Stop Fish Oil, Herbal Medications and Multivitamins 7,biotin,vit D days prior to surgery. Do not use NSAIDS (Ibuprofen, Aleve, etc.) or Aspirin products 5days prior to surgery. (04/18/15) Follow physician's instructions on stopping Eliquis.   Do not wear jewelry, make-up or nail polish.  Do not wear lotions, powders, or perfumes.  You may wear deodorant.  Do not shave 48 hours prior to surgery.    Do not bring valuables to the hospital.  [facility] is not responsible for any belongings or valuables.  Contacts, dentures or bridgework may not be worn into surgery.  Leave your suitcase in the car.  After surgery it may be brought to your room.  For patients admitted to the hospital, discharge time will be determined by your treatment team.  Special instructions:   Special Instructions: [place] - Preparing for Surgery  Before surgery, you can play an important role.  Because skin is not sterile, your skin needs to be as free of germs as possible.  You can reduce the number of germs on you skin by washing with CHG (chlorahexidine  gluconate) soap before surgery.  CHG is an antiseptic cleaner which kills germs and bonds with the skin to continue killing germs even after washing.  Please DO NOT use if you have an allergy to CHG or antibacterial soaps.  If your skin becomes reddened/irritated stop using the CHG and inform your nurse when you arrive at Short Stay.  Do not shave (including legs and underarms) for at least 48 hours prior to the first CHG shower.  You may shave your face.  Please follow these instructions carefully:   1.  Shower with CHG Soap the night before surgery and the morning of Surgery.  2.  If you choose to wash your hair, wash your hair first as usual with your normal shampoo.  3.  After you shampoo, rinse your hair and body thoroughly to remove the Shampoo.  4.  Use CHG as you would any other liquid soap.  You can apply chg directly  to the skin and wash gently with scrungie or a clean washcloth.  5.  Apply the CHG Soap to your body ONLY FROM THE NECK DOWN.  Do not use on open wounds or open sores.  Avoid contact with your eyes ears, mouth and genitals (private parts).  Wash genitals (private parts)       with your normal soap.  6.  Wash thoroughly, paying special attention to the area where your surgery will be performed.  7.  Thoroughly rinse your body with warm water from the neck down.  8.  DO NOT shower/wash with your normal soap after using and rinsing off the CHG Soap.  9.  Pat yourself dry with a clean towel.            10.  Wear clean pajamas.            11.  Place clean sheets on your bed the night of your first shower and do not sleep with pets.  Day of Surgery  Do not apply any lotions/deodorants the morning of surgery.  Please wear clean clothes to the hospital/surgery center..  Please read over the following fact sheets that you were given. Pain Booklet, Coughing and Deep Breathing, MRSA Information and Surgical Site Infection Prevention

## 2015-04-19 NOTE — Progress Notes (Signed)
Anesthesia Chart Review:  Pt is 64 year old female scheduled for L total shoulder arthroplasty on 04/23/2015 with Dr. Veverly Fells.   Cardiologist is Dr. Ena Dawley. PCP is Dr. Josetta Huddle.   PMH includes: atrial fibrillation (s/p ablation 12/2013 but afib has recurred), heart murmur, hypothyroidism. Never smoker. BMI 20.  Medications include: eliquis, flecainide, levothyroxine. Pt to stop eliquis a day before surgery.   Preoperative labs reviewed.    EKG 03/18/2015: NSR. Low voltage QRS.   Exercise tolerance test 03/25/2015:  There was no ST segment deviation noted during stress.  Normal study, post Flecainide  TEE 12/22/2013:  - Left ventricle: The cavity size was normal. Wall thickness was normal. Systolic function was normal. The estimated ejection fraction was in the range of 55% to 60%. - Aortic valve: There was mild regurgitation. - Mitral valve: There was mild regurgitation. - Left atrium: The atrium was dilated. No evidence of thrombus in the atrial cavity or appendage. - Right atrium: No evidence of thrombus in the atrial cavity or appendage. - Atrial septum: No defect or patent foramen ovale was identified. - Tricuspid valve: No evidence of vegetation. There was moderate regurgitation. - Pulmonic valve: No evidence of vegetation.  Pt has medical clearance from Dr. Inda Merlin on paper chart.   Dr. Meda Coffee states in [place] note dated 03/18/15 that pt would be cleared for shoulder surgery with normal stress test. As test results were normal, I anticipate pt can proceed as scheduled.   If no changes, I anticipate pt can proceed with surgery as scheduled.   Willeen Cass, FNP-BC [facility]/Anesthesiology Phone: [phone] 04/19/2015 3:02 PM

## 2015-04-23 ENCOUNTER — Inpatient Hospital Stay (HOSPITAL_COMMUNITY): Payer: PPO | Admitting: Emergency Medicine

## 2015-04-23 ENCOUNTER — Inpatient Hospital Stay (HOSPITAL_COMMUNITY)
Admission: RE | Admit: 2015-04-23 | Discharge: 2015-04-25 | DRG: 483 | Disposition: A | Discharge status: Home Health | Payer: PPO | Source: Ambulatory Visit | Attending: Orthopedic Surgery | Admitting: Orthopedic Surgery

## 2015-04-23 ENCOUNTER — Encounter (HOSPITAL_COMMUNITY): Payer: Self-pay | Admitting: General Practice

## 2015-04-23 ENCOUNTER — Inpatient Hospital Stay (HOSPITAL_COMMUNITY): Payer: PPO | Admitting: Anesthesiology

## 2015-04-23 ENCOUNTER — Encounter (HOSPITAL_COMMUNITY)
Admission: RE | Disposition: A | Discharge status: Home Health | Payer: Self-pay | Source: Ambulatory Visit | Attending: Orthopedic Surgery

## 2015-04-23 ENCOUNTER — Inpatient Hospital Stay (HOSPITAL_COMMUNITY): Payer: PPO

## 2015-04-23 DIAGNOSIS — F329 Major depressive disorder, single episode, unspecified: Secondary | ICD-10-CM | POA: Diagnosis present

## 2015-04-23 DIAGNOSIS — M19012 Primary osteoarthritis, left shoulder: Secondary | ICD-10-CM | POA: Diagnosis present

## 2015-04-23 DIAGNOSIS — Z96612 Presence of left artificial shoulder joint: Secondary | ICD-10-CM

## 2015-04-23 DIAGNOSIS — Z7902 Long term (current) use of antithrombotics/antiplatelets: Secondary | ICD-10-CM | POA: Diagnosis not present

## 2015-04-23 DIAGNOSIS — Z79899 Other long term (current) drug therapy: Secondary | ICD-10-CM

## 2015-04-23 DIAGNOSIS — E039 Hypothyroidism, unspecified: Secondary | ICD-10-CM | POA: Diagnosis present

## 2015-04-23 DIAGNOSIS — I480 Paroxysmal atrial fibrillation: Secondary | ICD-10-CM | POA: Diagnosis present

## 2015-04-23 DIAGNOSIS — Z96619 Presence of unspecified artificial shoulder joint: Secondary | ICD-10-CM

## 2015-04-23 DIAGNOSIS — M25512 Pain in left shoulder: Secondary | ICD-10-CM | POA: Diagnosis present

## 2015-04-23 HISTORY — PX: TOTAL SHOULDER ARTHROPLASTY: SHX126

## 2015-04-23 SURGERY — ARTHROPLASTY, SHOULDER, TOTAL
Anesthesia: Regional | Laterality: Left

## 2015-04-23 MED ORDER — ADULT MULTIVITAMIN W/MINERALS CH
1.0000 | ORAL_TABLET | Freq: Every day | ORAL | Status: DC
  Administered 2015-04-24 – 2015-04-25 (×2): 1 via ORAL
  Filled 2015-04-23 (×2): qty 1

## 2015-04-23 MED ORDER — FENTANYL CITRATE (PF) 100 MCG/2ML IJ SOLN
INTRAMUSCULAR | Status: DC | PRN
  Administered 2015-04-23 (×2): 50 ug via INTRAVENOUS

## 2015-04-23 MED ORDER — LEVOTHYROXINE SODIUM 50 MCG PO TABS
50.0000 ug | ORAL_TABLET | Freq: Every day | ORAL | Status: DC
  Administered 2015-04-24: 50 ug via ORAL
  Filled 2015-04-23: qty 1

## 2015-04-23 MED ORDER — SERTRALINE HCL 50 MG PO TABS
150.0000 mg | ORAL_TABLET | Freq: Every day | ORAL | Status: DC
  Administered 2015-04-23 – 2015-04-24 (×2): 150 mg via ORAL
  Filled 2015-04-23 (×4): qty 1

## 2015-04-23 MED ORDER — OXYCODONE HCL 5 MG/5ML PO SOLN: 5.0000 mg | Freq: Once | ORAL | Status: DC | PRN

## 2015-04-23 MED ORDER — THROMBIN 5000 UNITS EX SOLR
CUTANEOUS | Status: DC | PRN
  Administered 2015-04-23: 5000 [IU] via TOPICAL

## 2015-04-23 MED ORDER — PHENYLEPHRINE HCL 10 MG/ML IJ SOLN
INTRAMUSCULAR | Status: DC | PRN
  Administered 2015-04-23 (×2): 80 ug via INTRAVENOUS

## 2015-04-23 MED ORDER — SODIUM CHLORIDE 0.9 % IR SOLN
Status: DC | PRN
  Administered 2015-04-23: 1000 mL

## 2015-04-23 MED ORDER — BUPIVACAINE-EPINEPHRINE (PF) 0.25% -1:200000 IJ SOLN
INTRAMUSCULAR | Status: AC
  Filled 2015-04-23: qty 30

## 2015-04-23 MED ORDER — BIOTIN 5000 MCG PO CAPS: 1.0000 | ORAL_CAPSULE | Freq: Every day | ORAL | Status: DC

## 2015-04-23 MED ORDER — METOCLOPRAMIDE HCL 5 MG PO TABS
5.0000 mg | ORAL_TABLET | Freq: Three times a day (TID) | ORAL | Status: DC | PRN
  Administered 2015-04-23: 10 mg via ORAL
  Filled 2015-04-23: qty 2

## 2015-04-23 MED ORDER — LACTATED RINGERS IV SOLN
INTRAVENOUS | Status: DC | PRN
  Administered 2015-04-23 (×2): via INTRAVENOUS

## 2015-04-23 MED ORDER — THROMBIN 5000 UNITS EX SOLR
CUTANEOUS | Status: AC
  Filled 2015-04-23: qty 5000

## 2015-04-23 MED ORDER — ONDANSETRON HCL 4 MG/2ML IJ SOLN
INTRAMUSCULAR | Status: DC | PRN
  Administered 2015-04-23: 4 mg via INTRAVENOUS

## 2015-04-23 MED ORDER — CEFAZOLIN SODIUM-DEXTROSE 2-3 GM-% IV SOLR
2.0000 g | INTRAVENOUS | Status: AC
  Administered 2015-04-23: 2 g via INTRAVENOUS

## 2015-04-23 MED ORDER — PROPOFOL 10 MG/ML IV BOLUS
INTRAVENOUS | Status: AC
  Filled 2015-04-23: qty 20

## 2015-04-23 MED ORDER — FLECAINIDE ACETATE 100 MG PO TABS
100.0000 mg | ORAL_TABLET | Freq: Two times a day (BID) | ORAL | Status: DC
  Administered 2015-04-23 – 2015-04-25 (×4): 100 mg via ORAL
  Filled 2015-04-23 (×6): qty 1

## 2015-04-23 MED ORDER — ONDANSETRON HCL 4 MG/2ML IJ SOLN: 4.0000 mg | Freq: Four times a day (QID) | INTRAMUSCULAR | Status: DC | PRN

## 2015-04-23 MED ORDER — LIDOCAINE HCL (CARDIAC) 20 MG/ML IV SOLN
INTRAVENOUS | Status: DC | PRN
  Administered 2015-04-23: 40 mg via INTRAVENOUS

## 2015-04-23 MED ORDER — OMEGA-3-ACID ETHYL ESTERS 1 G PO CAPS
1.0000 g | ORAL_CAPSULE | Freq: Every day | ORAL | Status: DC
  Administered 2015-04-24 – 2015-04-25 (×2): 1 g via ORAL
  Filled 2015-04-23 (×4): qty 1

## 2015-04-23 MED ORDER — MIDAZOLAM HCL 2 MG/2ML IJ SOLN
INTRAMUSCULAR | Status: AC
  Filled 2015-04-23: qty 4

## 2015-04-23 MED ORDER — CEFAZOLIN SODIUM-DEXTROSE 2-3 GM-% IV SOLR
2.0000 g | Freq: Four times a day (QID) | INTRAVENOUS | Status: AC
  Administered 2015-04-23 – 2015-04-24 (×3): 2 g via INTRAVENOUS
  Filled 2015-04-23 (×3): qty 50

## 2015-04-23 MED ORDER — ROCURONIUM BROMIDE 100 MG/10ML IV SOLN
INTRAVENOUS | Status: DC | PRN
  Administered 2015-04-23: 40 mg via INTRAVENOUS

## 2015-04-23 MED ORDER — HYDROCODONE-ACETAMINOPHEN 5-325 MG PO TABS
1.0000 | ORAL_TABLET | ORAL | Status: DC | PRN
  Administered 2015-04-23: 1 via ORAL
  Administered 2015-04-23: 2 via ORAL
  Administered 2015-04-23: 1 via ORAL
  Administered 2015-04-24 – 2015-04-25 (×8): 2 via ORAL
  Filled 2015-04-23 (×4): qty 2
  Filled 2015-04-23 (×2): qty 1
  Filled 2015-04-23 (×5): qty 2

## 2015-04-23 MED ORDER — FENTANYL CITRATE (PF) 250 MCG/5ML IJ SOLN
INTRAMUSCULAR | Status: AC
  Filled 2015-04-23: qty 5

## 2015-04-23 MED ORDER — CEFAZOLIN SODIUM-DEXTROSE 2-3 GM-% IV SOLR
INTRAVENOUS | Status: AC
  Administered 2015-04-23: 2 g via INTRAVENOUS
  Filled 2015-04-23: qty 50

## 2015-04-23 MED ORDER — ONDANSETRON HCL 4 MG PO TABS: 4.0000 mg | ORAL_TABLET | Freq: Four times a day (QID) | ORAL | Status: DC | PRN

## 2015-04-23 MED ORDER — METOCLOPRAMIDE HCL 5 MG/ML IJ SOLN: 5.0000 mg | Freq: Three times a day (TID) | INTRAMUSCULAR | Status: DC | PRN

## 2015-04-23 MED ORDER — ACETAMINOPHEN 650 MG RE SUPP: 650.0000 mg | Freq: Four times a day (QID) | RECTAL | Status: DC | PRN

## 2015-04-23 MED ORDER — GLYCOPYRROLATE 0.2 MG/ML IJ SOLN
INTRAMUSCULAR | Status: AC
  Filled 2015-04-23: qty 2

## 2015-04-23 MED ORDER — ALUM & MAG HYDROXIDE-SIMETH 200-200-20 MG/5ML PO SUSP
30.0000 mL | ORAL | Status: DC | PRN
  Filled 2015-04-23: qty 30

## 2015-04-23 MED ORDER — SODIUM CHLORIDE 0.9 % IV SOLN: INTRAVENOUS | Status: DC

## 2015-04-23 MED ORDER — MENTHOL 3 MG MT LOZG: 1.0000 | LOZENGE | OROMUCOSAL | Status: DC | PRN

## 2015-04-23 MED ORDER — HYDROCODONE-ACETAMINOPHEN 5-325 MG PO TABS: 1.0000 | ORAL_TABLET | ORAL | Status: DC | PRN

## 2015-04-23 MED ORDER — APIXABAN 5 MG PO TABS
5.0000 mg | ORAL_TABLET | Freq: Two times a day (BID) | ORAL | Status: DC
  Administered 2015-04-23 – 2015-04-25 (×4): 5 mg via ORAL
  Filled 2015-04-23 (×4): qty 1

## 2015-04-23 MED ORDER — CHLORHEXIDINE GLUCONATE 4 % EX LIQD: 60.0000 mL | Freq: Once | CUTANEOUS | Status: DC

## 2015-04-23 MED ORDER — MORPHINE SULFATE (PF) 2 MG/ML IV SOLN
2.0000 mg | INTRAVENOUS | Status: DC | PRN
  Administered 2015-04-23 – 2015-04-24 (×2): 2 mg via INTRAVENOUS
  Filled 2015-04-23 (×2): qty 1

## 2015-04-23 MED ORDER — NEOSTIGMINE METHYLSULFATE 10 MG/10ML IV SOLN
INTRAVENOUS | Status: DC | PRN
  Administered 2015-04-23: 3 mg via INTRAVENOUS

## 2015-04-23 MED ORDER — POLYETHYLENE GLYCOL 3350 17 G PO PACK: 17.0000 g | PACK | Freq: Every day | ORAL | Status: DC | PRN

## 2015-04-23 MED ORDER — OXYCODONE HCL 5 MG PO TABS: 5.0000 mg | ORAL_TABLET | Freq: Once | ORAL | Status: DC | PRN

## 2015-04-23 MED ORDER — FENTANYL CITRATE (PF) 100 MCG/2ML IJ SOLN: 25.0000 ug | INTRAMUSCULAR | Status: DC | PRN

## 2015-04-23 MED ORDER — BISACODYL 10 MG RE SUPP
10.0000 mg | Freq: Every day | RECTAL | Status: DC | PRN
  Filled 2015-04-23: qty 1

## 2015-04-23 MED ORDER — MIDAZOLAM HCL 5 MG/5ML IJ SOLN
INTRAMUSCULAR | Status: DC | PRN
  Administered 2015-04-23: 2 mg via INTRAVENOUS

## 2015-04-23 MED ORDER — PHENYLEPHRINE HCL 10 MG/ML IJ SOLN
10.0000 mg | INTRAVENOUS | Status: DC | PRN
  Administered 2015-04-23: 25 ug/min via INTRAVENOUS

## 2015-04-23 MED ORDER — PHENOL 1.4 % MT LIQD: 1.0000 | OROMUCOSAL | Status: DC | PRN

## 2015-04-23 MED ORDER — BUPIVACAINE-EPINEPHRINE (PF) 0.5% -1:200000 IJ SOLN
INTRAMUSCULAR | Status: DC | PRN
  Administered 2015-04-23: 25 mL via PERINEURAL

## 2015-04-23 MED ORDER — ACETAMINOPHEN 160 MG/5ML PO SOLN
325.0000 mg | ORAL | Status: DC | PRN
  Filled 2015-04-23: qty 20.3

## 2015-04-23 MED ORDER — BUPIVACAINE-EPINEPHRINE 0.25% -1:200000 IJ SOLN
INTRAMUSCULAR | Status: DC | PRN
  Administered 2015-04-23: 6 mL

## 2015-04-23 MED ORDER — ACETAMINOPHEN 325 MG PO TABS: 325.0000 mg | ORAL_TABLET | ORAL | Status: DC | PRN

## 2015-04-23 MED ORDER — METHOCARBAMOL 500 MG PO TABS
500.0000 mg | ORAL_TABLET | Freq: Four times a day (QID) | ORAL | Status: DC | PRN
  Administered 2015-04-23 – 2015-04-24 (×3): 500 mg via ORAL
  Filled 2015-04-23 (×4): qty 1

## 2015-04-23 MED ORDER — TRETINOIN 0.05 % EX CREA
TOPICAL_CREAM | CUTANEOUS | Status: DC
  Filled 2015-04-23: qty 1

## 2015-04-23 MED ORDER — NEOSTIGMINE METHYLSULFATE 10 MG/10ML IV SOLN
INTRAVENOUS | Status: AC
  Filled 2015-04-23: qty 1

## 2015-04-23 MED ORDER — ACETAMINOPHEN 325 MG PO TABS
650.0000 mg | ORAL_TABLET | Freq: Four times a day (QID) | ORAL | Status: DC | PRN
  Filled 2015-04-23: qty 2

## 2015-04-23 MED ORDER — ACETAMINOPHEN 325 MG PO TABS: 650.0000 mg | ORAL_TABLET | Freq: Four times a day (QID) | ORAL | Status: DC | PRN

## 2015-04-23 MED ORDER — METHOCARBAMOL 500 MG PO TABS: 500.0000 mg | ORAL_TABLET | Freq: Three times a day (TID) | ORAL | Status: DC | PRN

## 2015-04-23 MED ORDER — GELATIN ABSORBABLE 12-7 MM EX MISC
CUTANEOUS | Status: DC | PRN
  Administered 2015-04-23: 1

## 2015-04-23 MED ORDER — VITAMIN D3 25 MCG (1000 UNIT) PO TABS
5000.0000 [IU] | ORAL_TABLET | Freq: Every day | ORAL | Status: DC
  Administered 2015-04-23 – 2015-04-25 (×3): 5000 [IU] via ORAL
  Filled 2015-04-23 (×5): qty 5

## 2015-04-23 MED ORDER — TRAZODONE HCL 50 MG PO TABS
25.0000 mg | ORAL_TABLET | Freq: Every day | ORAL | Status: DC
  Administered 2015-04-23 – 2015-04-24 (×2): 25 mg via ORAL
  Filled 2015-04-23 (×2): qty 1

## 2015-04-23 MED ORDER — TRETINOIN 0.05 % EX CREA
TOPICAL_CREAM | Freq: Every day | CUTANEOUS | Status: DC
  Filled 2015-04-23 (×9): qty 1

## 2015-04-23 MED ORDER — DEXTROSE 5 % IV SOLN
500.0000 mg | Freq: Four times a day (QID) | INTRAVENOUS | Status: DC | PRN
  Filled 2015-04-23: qty 5

## 2015-04-23 MED ORDER — ONDANSETRON HCL 4 MG/2ML IJ SOLN
INTRAMUSCULAR | Status: AC
  Filled 2015-04-23: qty 2

## 2015-04-23 MED ORDER — GLYCOPYRROLATE 0.2 MG/ML IJ SOLN
INTRAMUSCULAR | Status: DC | PRN
  Administered 2015-04-23: 0.4 mg via INTRAVENOUS

## 2015-04-23 MED ORDER — PROPOFOL 10 MG/ML IV BOLUS
INTRAVENOUS | Status: DC | PRN
  Administered 2015-04-23: 20 mg via INTRAVENOUS
  Administered 2015-04-23: 90 mg via INTRAVENOUS

## 2015-04-23 SURGICAL SUPPLY — 74 items
BLADE SAW SAG 73X25 THK (BLADE) ×1
BLADE SAW SGTL 73X25 THK (BLADE) ×1 IMPLANT
BOWL SMART MIX CTS (DISPOSABLE) ×1 IMPLANT
BUR SURG 4X8 MED (BURR) IMPLANT
BURR SURG 4X8 MED (BURR)
CAPT SHLDR TOTAL 2 ×1 IMPLANT
CEMENT BONE DEPUY (Cement) ×3 IMPLANT
CLSR STERI-STRIP ANTIMIC 1/2X4 (GAUZE/BANDAGES/DRESSINGS) ×1 IMPLANT
COVER SURGICAL LIGHT HANDLE (MISCELLANEOUS) ×2 IMPLANT
DRAPE IMP U-DRAPE 54X76 (DRAPES) ×2 IMPLANT
DRAPE INCISE IOBAN 66X45 STRL (DRAPES) ×6 IMPLANT
DRAPE U-SHAPE 47X51 STRL (DRAPES) ×2 IMPLANT
DRAPE X-RAY CASS 24X20 (DRAPES) IMPLANT
DRILL BIT 5/64 (BIT) ×2 IMPLANT
DRSG ADAPTIC 3X8 NADH LF (GAUZE/BANDAGES/DRESSINGS) ×2 IMPLANT
DRSG PAD ABDOMINAL 8X10 ST (GAUZE/BANDAGES/DRESSINGS) ×3 IMPLANT
DURAPREP 26ML APPLICATOR (WOUND CARE) ×2 IMPLANT
ELECT BLADE 4.0 EZ CLEAN MEGAD (MISCELLANEOUS) ×2
ELECT NDL TIP 2.8 STRL (NEEDLE) ×1 IMPLANT
ELECT NEEDLE TIP 2.8 STRL (NEEDLE) ×2 IMPLANT
ELECT REM PT RETURN 9FT ADLT (ELECTROSURGICAL) ×2
ELECTRODE BLDE 4.0 EZ CLN MEGD (MISCELLANEOUS) ×1 IMPLANT
ELECTRODE REM PT RTRN 9FT ADLT (ELECTROSURGICAL) ×1 IMPLANT
GAUZE SPONGE 4X4 12PLY STRL (GAUZE/BANDAGES/DRESSINGS) ×2 IMPLANT
GLOVE BIOGEL PI ORTHO PRO 7.5 (GLOVE) ×1
GLOVE BIOGEL PI ORTHO PRO SZ8 (GLOVE) ×1
GLOVE ORTHO TXT STRL SZ7.5 (GLOVE) ×2 IMPLANT
GLOVE PI ORTHO PRO STRL 7.5 (GLOVE) ×1 IMPLANT
GLOVE PI ORTHO PRO STRL SZ8 (GLOVE) ×1 IMPLANT
GLOVE SURG ORTHO 8.5 STRL (GLOVE) ×4 IMPLANT
GOWN STRL REUS W/ TWL XL LVL3 (GOWN DISPOSABLE) ×3 IMPLANT
GOWN STRL REUS W/TWL XL LVL3 (GOWN DISPOSABLE) ×6
HANDPIECE INTERPULSE COAX TIP (DISPOSABLE)
KIT BASIN OR (CUSTOM PROCEDURE TRAY) ×2 IMPLANT
KIT ROOM TURNOVER OR (KITS) ×2 IMPLANT
MANIFOLD NEPTUNE II (INSTRUMENTS) ×2 IMPLANT
NDL 1/2 CIR MAYO (NEEDLE) ×1 IMPLANT
NDL HYPO 25GX1X1/2 BEV (NEEDLE) ×1 IMPLANT
NDL SUT 6 .5 CRC .975X.05 MAYO (NEEDLE) ×1 IMPLANT
NEEDLE 1/2 CIR MAYO (NEEDLE) ×4 IMPLANT
NEEDLE HYPO 25GX1X1/2 BEV (NEEDLE) ×2 IMPLANT
NEEDLE MAYO TAPER (NEEDLE) ×2
NS IRRIG 1000ML POUR BTL (IV SOLUTION) ×2 IMPLANT
PACK SHOULDER (CUSTOM PROCEDURE TRAY) ×2 IMPLANT
PACK UNIVERSAL I (CUSTOM PROCEDURE TRAY) ×2 IMPLANT
PAD ARMBOARD 7.5X6 YLW CONV (MISCELLANEOUS) ×4 IMPLANT
PASSER SUT SWANSON 36MM LOOP (INSTRUMENTS) ×1 IMPLANT
SET HNDPC FAN SPRY TIP SCT (DISPOSABLE) IMPLANT
SLING ARM IMMOBILIZER LRG (SOFTGOODS) ×2 IMPLANT
SLING ARM IMMOBILIZER MED (SOFTGOODS) IMPLANT
SMARTMIX MINI TOWER (MISCELLANEOUS) ×2
SPONGE LAP 18X18 X RAY DECT (DISPOSABLE) ×2 IMPLANT
SPONGE LAP 4X18 X RAY DECT (DISPOSABLE) ×2 IMPLANT
SPONGE SURGIFOAM ABS GEL SZ50 (HEMOSTASIS) IMPLANT
STRIP CLOSURE SKIN 1/2X4 (GAUZE/BANDAGES/DRESSINGS) ×2 IMPLANT
SUCTION FRAZIER TIP 10 FR DISP (SUCTIONS) ×2 IMPLANT
SUT FIBERWIRE #2 38 T-5 BLUE (SUTURE) ×4
SUT FIBERWIRE 2-0 18 17.9 3/8 (SUTURE) ×4
SUT MNCRL AB 4-0 PS2 18 (SUTURE) ×2 IMPLANT
SUT VIC AB 0 CT1 27 (SUTURE) ×2
SUT VIC AB 0 CT1 27XBRD ANBCTR (SUTURE) ×1 IMPLANT
SUT VIC AB 0 CT2 27 (SUTURE) ×1 IMPLANT
SUT VIC AB 2-0 CT1 27 (SUTURE) ×2
SUT VIC AB 2-0 CT1 TAPERPNT 27 (SUTURE) ×1 IMPLANT
SUT VICRYL AB 2 0 TIES (SUTURE) ×2 IMPLANT
SUTURE FIBERWR #2 38 T-5 BLUE (SUTURE) ×2 IMPLANT
SUTURE FIBERWR 2-0 18 17.9 3/8 (SUTURE) IMPLANT
SYR CONTROL 10ML LL (SYRINGE) ×2 IMPLANT
TOWEL OR 17X24 6PK STRL BLUE (TOWEL DISPOSABLE) ×2 IMPLANT
TOWEL OR 17X26 10 PK STRL BLUE (TOWEL DISPOSABLE) ×2 IMPLANT
TOWER SMARTMIX MINI (MISCELLANEOUS) ×1 IMPLANT
TRAY FOLEY CATH 16FRSI W/METER (SET/KITS/TRAYS/PACK) ×2 IMPLANT
WATER STERILE IRR 1000ML POUR (IV SOLUTION) ×2 IMPLANT
YANKAUER SUCT BULB TIP NO VENT (SUCTIONS) IMPLANT

## 2015-04-23 NOTE — Anesthesia Preprocedure Evaluation (Signed)
Anesthesia Evaluation  Patient identified by MRN, date of birth, ID band Patient awake    Reviewed: Allergy & Precautions, NPO status , Patient's Chart, lab work & pertinent test results  History of Anesthesia Complications Negative for: history of anesthetic complications  Airway Mallampati: II  TM Distance: >3 FB Neck ROM: Full    Dental  (+) Teeth Intact   Pulmonary neg pulmonary ROS,    breath sounds clear to auscultation       Cardiovascular (-) hypertension(-) angina(-) CAD, (-) Past MI and (-) CHF + dysrhythmias Atrial Fibrillation  Rhythm:Regular     Neuro/Psych PSYCHIATRIC DISORDERS Depression negative neurological ROS     GI/Hepatic negative GI ROS, Neg liver ROS,   Endo/Other  Hypothyroidism   Renal/GU negative Renal ROS     Musculoskeletal  (+) Arthritis ,   Abdominal   Peds  Hematology   Anesthesia Other Findings   Reproductive/Obstetrics                             Anesthesia Physical Anesthesia Plan  ASA: II  Anesthesia Plan: General and Regional   Post-op Pain Management:    Induction: Intravenous  Airway Management Planned: Oral ETT  Additional Equipment: None  Intra-op Plan:   Post-operative Plan: Extubation in OR  Informed Consent: I have reviewed the patients History and Physical, chart, labs and discussed the procedure including the risks, benefits and alternatives for the proposed anesthesia with the patient or authorized representative who has indicated his/her understanding and acceptance.   Dental advisory given  Plan Discussed with: CRNA and Surgeon  Anesthesia Plan Comments:         Anesthesia Quick Evaluation

## 2015-04-23 NOTE — Interval H&P Note (Signed)
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 After consideration of risks, benefits and other options for treatment, the patient has consented to  Procedure(s): LEFT TOTAL SHOULDER ARTHROPLASTY (Left) as a surgical intervention .  The patient's history has been reviewed, patient examined, no change in status, stable for surgery.  I have reviewed the patient's chart and labs.  Questions were answered to the patient's satisfaction.     NORRIS,STEVEN R

## 2015-04-23 NOTE — Anesthesia Postprocedure Evaluation (Signed)
  Anesthesia Post-op Note  Patient: Terri [name]  Procedure(s) Performed: Procedure(s): LEFT TOTAL SHOULDER ARTHROPLASTY (Left)  Patient Location: PACU  Anesthesia Type:GA combined with regional for post-op pain  Level of Consciousness: awake  Airway and Oxygen Therapy: Patient Spontanous Breathing  Post-op Pain: none  Post-op Assessment: Post-op Vital signs reviewed, Patient's Cardiovascular Status Stable, Respiratory Function Stable, Patent Airway, No signs of Nausea or vomiting and Pain level controlled LLE Motor Response: Purposeful movement (able to wiggle fingers)            Post-op Vital Signs: Reviewed and stable  Last Vitals:  Filed Vitals:   04/23/15 1300  BP: 99/58  Pulse: 61  Temp: 36.7 C  Resp: 15    Complications: No apparent anesthesia complications

## 2015-04-23 NOTE — Progress Notes (Signed)
Utilization review completed. Bertha Stanfill, RN, BSN. 

## 2015-04-23 NOTE — Op Note (Signed)
Terri [name], Terri [name] NO.:  [id number]  MEDICAL RECORD NO.:  [id number]  LOCATION:  [id number]                        FACILITY:  [facility]  PHYSICIAN:  Doran Heater. Veverly Fells, M.D. DATE OF BIRTH:  08/20/1950  DATE OF PROCEDURE:  04/23/2015 DATE OF DISCHARGE:                              OPERATIVE REPORT   PREOPERATIVE DIAGNOSIS:  Left shoulder end-stage arthritis.  POSTOPERATIVE DIAGNOSIS:  Left shoulder end-stage arthritis.  PROCEDURE PERFORMED:  Left total shoulder arthroplasty using DePuy Global Unite prosthesis.  ATTENDING SURGEON:  Doran Heater. Veverly Fells, M.D.  ASSISTANT:  Abbott Pao. [facility], PA-C who scrubbed the entire procedure and necessary for satisfactory completion of surgery.  ANESTHESIA:  General anesthesia was used plus interscalene block.  ESTIMATED BLOOD LOSS:  100 mL.  FLUID REPLACEMENT:  1200 mL crystalloid.  INSTRUMENT COUNTS:  Correct.  COMPLICATIONS:  There were no complications.  ANTIBIOTICS:  Perioperative antibiotics were given.  INDICATIONS:  The patient is a 64 year old female with worsening pain and loss of shoulder function secondary to end-stage arthritis.  The patient has bone-on-bone changes on x-ray and has had progressive functional loss and pain.  She presents for total shoulder arthroplasty to relieve pain and restore function to her shoulder.  We managed her conservatively with injections, modification activity, pain medications anti-inflammatories.  Informed consent obtained.  DESCRIPTION OF PROCEDURE:  After an adequate level of anesthesia was achieved, the patient was positioned in the modified beach chair position.  The left shoulder was correctly identified, and a time-out was called.  Sterile prep and drape of the shoulder and arm performed. We then entered the shoulder using standard deltopectoral incision starting at the coracoid process and extending distally to the anterior humerus.  We dissected down through subcutaneous  tissues.  Cephalic vein was identified and taken laterally with the deltoid, pectoralis was taken medially.  Conjoined tendon was identified and retracted medially. The subscapularis was taken subperiosteally off the lesser tuberosity. A #2 FiberWire suture in a modified Mason-Allen suture technique was utilized to tag the subscapularis repair.  We then externally rotated the humerus releasing the inferior capsule.  We then went ahead and placed our T-handled Crego elevator over the top of the humeral head under the rotator cuff and then able to __________ Crego medially and then using neck resection guide for the humeral head cut.  We went ahead and cut the head with the elbow at the patient's side in the form externally rotated about 30 degrees to get 30 degree retroverted cut. Once, we made our head cut, flushed with the rotator cuff insertion, removed excess osteophytes off the inferior and posterior humerus.  We then went ahead and did our reaming up to size 12 and then broached for the size 12 Global Unite stem from DePuy.  We next went ahead once we verify that stem would fit in, we retracted the humerus posteriorly, did a 360 degree labral removal and caps removal.  The biceps was tenodesed prior to disconnecting the biceps, we had proper tension, we sewed that into the pectoralis and also the soft tissue around the shoulder.  We next went ahead and prepared our glenoid.  We identified the center point placed  our guidepin, we then reamed for the 40 glenoid and then did our peripheral hand reaming, drilled our central peg hole and the 3 peripheral holes.  We trialed with the 48 PG glenoid trial and that fit perfectly and good containment of all the holes.  We then went ahead and irrigated then dried the peripheral holes with Gel-Foam soaked in thrombin.  We then used vacuum mixing technique with DePuy 1 cement and inserted cement into the 3 peripheral holes, noncentral peg hole.   We impacted the APJ glenoid in place, held the pressure on the glenoid until the cement was hard and then went ahead and drilled our holes on the humerus around the lesser tuberosity and placed #2 FiberWire suture for repair of the subscapularis.  We irrigated thoroughly.  We then use combination hybrid technique, a little bit of cement distally in the canal with the patient's bone was extremely soft followed by some bone grafting approximately and impaction grafting technique and cemented the stem into place and impacted that in position and had nice fit and felt like that gave [place] good initial stability and still allow for the coracoid proximal on growth.  Once, we had that securely in place.  We went ahead and trialed a 4418 eccentric head and placed that and gave a good head coverage, reduced the shoulder and had nice soft tissue balancing.  We could translate inferiorly about 50% of the diameter of the glenoid and posteriorly about 50% of the diameter.  We removed the trial head and then impacted the real 4418 eccentric head into place with eccentricity dealt up toward the greater tuberosity and then we reduced the shoulder and then repaired the subscap anatomically back to bone and rotator interval as well as #2 FiberWire suture.  We were then pleased with soft tissue balancing could actually rotate about 45 degrees without placing tension on the repair site.  We then went ahead and irrigated again and then repaired deltopectoral interval with 0 Vicryl suture followed by 2-0 Vicryl subcutaneous closure with 4-0 Monocryl for skin.  Steri-Strips applied followed by sterile dressing. The patient tolerated the surgery well.     Doran Heater. Veverly Fells, M.D.     SRN/MEDQ  D:  04/23/2015  T:  04/23/2015  Job:  [id number]

## 2015-04-23 NOTE — Brief Op Note (Signed)
04/23/2015  9:48 AM  PATIENT:  Terri [name]  64 y.o. female  PRE-OPERATIVE DIAGNOSIS:  LEFT SHOULDER END STAGE OA  POST-OPERATIVE DIAGNOSIS:  LEFT SHOULDER END STAGE OA  PROCEDURE:  Procedure(s): LEFT TOTAL SHOULDER ARTHROPLASTY (Left) DePuy Global Unite  SURGEON:  Surgeon(s) and Role:    * Netta Cedars, MD - Primary  PHYSICIAN ASSISTANT:   ASSISTANTS: Ventura Bruns, PA-C   ANESTHESIA:   regional and general  EBL:  Total I/O In: 1000 [I.V.:1000] Out: -   BLOOD ADMINISTERED:none  DRAINS: none   LOCAL MEDICATIONS USED:  MARCAINE     SPECIMEN:  No Specimen  DISPOSITION OF SPECIMEN:  N/A  COUNTS:  YES  TOURNIQUET:  * No tourniquets in log *  DICTATION: .Other Dictation: Dictation Number [id number]  PLAN OF CARE: Admit to inpatient   PATIENT DISPOSITION:  PACU - hemodynamically stable.   Delay start of Pharmacological VTE agent (>24hrs) due to surgical blood loss or risk of bleeding: yes

## 2015-04-23 NOTE — Transfer of Care (Signed)
Immediate Anesthesia Transfer of Care Note  Patient: Terri [name]  Procedure(s) Performed: Procedure(s): LEFT TOTAL SHOULDER ARTHROPLASTY (Left)  Patient Location: PACU  Anesthesia Type:General  Level of Consciousness: awake  Airway & Oxygen Therapy: Patient Spontanous Breathing and Patient connected to nasal cannula oxygen  Post-op Assessment: Report given to RN and Post -op Vital signs reviewed and stable  Post vital signs: Reviewed and stable  Last Vitals:  Filed Vitals:   04/23/15 0548  BP: 93/51  Pulse: 64  Temp: 36.4 C  Resp: 18    Complications: No apparent anesthesia complications

## 2015-04-23 NOTE — Anesthesia Procedure Notes (Addendum)
Procedure Name: Intubation Date/Time: 04/23/2015 7:42 AM Performed by: Manus Gunning, HAL J Pre-anesthesia Checklist: Timeout performed, Patient identified, Emergency Drugs available, Suction available and Patient being monitored Patient Re-evaluated:Patient Re-evaluated prior to inductionOxygen Delivery Method: Circle system utilized Preoxygenation: Pre-oxygenation with 100% oxygen Intubation Type: IV induction Ventilation: Mask ventilation without difficulty Laryngoscope Size: Mac and 3 Grade View: Grade I Tube type: Oral Tube size: 6.5 mm Number of attempts: 1 Placement Confirmation: ETT inserted through vocal cords under direct vision,  breath sounds checked- equal and bilateral and positive ETCO2 Secured at: 21 cm Tube secured with: Tape Dental Injury: Teeth and Oropharynx as per pre-operative assessment    Anesthesia Regional Block:  Interscalene brachial plexus block  Pre-Anesthetic Checklist: ,, timeout performed, Correct Patient, Correct Site, Correct Laterality, Correct Procedure, Correct Position, site marked, Risks and benefits discussed,  Surgical consent,  Pre-op evaluation,  At surgeon's request and post-op pain management  Laterality: Upper and Left  Prep: chloraprep       Needles:  Injection technique: Single-shot  Needle Type: Echogenic Stimulator Needle          Additional Needles:  Procedures: ultrasound guided (picture in chart) and nerve stimulator Interscalene brachial plexus block  Nerve Stimulator or Paresthesia:  Response: deltoid, 0.5 mA,   Additional Responses:   Narrative:  Injection made incrementally with aspirations every 5 mL.  Performed by: Personally  Anesthesiologist: Asiana Benninger  Additional Notes: H+P and labs reviewed, risks and benefits discussed with patient, procedure tolerated well without complications

## 2015-04-24 LAB — BASIC METABOLIC PANEL
Anion gap: 6 (ref 5–15)
BUN: 9 mg/dL (ref 6–20)
CHLORIDE: 96 mmol/L — AB (ref 101–111)
CO2: 29 mmol/L (ref 22–32)
Calcium: 8.5 mg/dL — ABNORMAL LOW (ref 8.9–10.3)
Creatinine, Ser: 0.65 mg/dL (ref 0.44–1.00)
GFR calc Af Amer: >60 mL/min (ref >60)
GFR calc non Af Amer: >60 mL/min (ref >60)
Glucose, Bld: 146 mg/dL — ABNORMAL HIGH (ref 65–99)
POTASSIUM: 4.1 mmol/L (ref 3.5–5.1)
SODIUM: 131 mmol/L — AB (ref 135–145)

## 2015-04-24 LAB — HEMOGLOBIN AND HEMATOCRIT, BLOOD
HCT: 33.3 % — ABNORMAL LOW (ref 36.0–46.0)
Hemoglobin: 11.1 g/dL — ABNORMAL LOW (ref 12.0–15.0)

## 2015-04-24 NOTE — Progress Notes (Signed)
    Subjective: 1 Day Post-Op Procedure(s) (LRB): LEFT TOTAL SHOULDER ARTHROPLASTY (Left) Patient reports pain as 4 on 0-10 scale.   Denies CP or SOB.  Voiding without difficulty. Positive flatus. Objective: Vital signs in last 24 hours: Temp:  [97.5 F (36.4 C)-98.9 F (37.2 C)] 98.8 F (37.1 C) (09/17 0854) Pulse Rate:  [53-68] 67 (09/17 0854) Resp:  [9-18] 18 (09/17 0854) BP: (94-117)/(47-63) 111/57 mmHg (09/17 0854) SpO2:  [94 %-100 %] 95 % (09/17 0854)  Intake/Output from previous day: 09/16 0701 - 09/17 0700 In: [place] [P.O.:240; I.V.:1600] Out: 50 [Blood:50] Intake/Output this shift:    Labs:  Recent Labs  04/24/15 0608  HGB 11.1*    Recent Labs  04/24/15 0608  HCT 33.3*    Recent Labs  04/24/15 0608  NA 131*  K 4.1  CL 96*  CO2 29  BUN 9  CREATININE 0.65  GLUCOSE 146*  CALCIUM 8.5*   No results for input(s): LABPT, INR in the last 72 hours.  Physical Exam: Neurologically intact ABD soft Intact pulses distally Incision: dressing C/D/I and no drainage Compartment soft  Assessment/Plan: 1 Day Post-Op Procedure(s) (LRB): LEFT TOTAL SHOULDER ARTHROPLASTY (Left) Advance diet Up with therapy Plan for discharge tomorrow  Anselmo Reihl D for Dr. Melina Schools [facility] [phone] 04/24/2015, 9:37 AM

## 2015-04-24 NOTE — Progress Notes (Signed)
Occupational Therapy Treatment Patient Details Name: Terri [name] MRN: [id number] DOB: October 09, 1950 Today's Date: 04/24/2015    History of present illness 64 yo female s/p L TSA with active protocol   OT comments  Pt progressing toward goals however pt with poor recall from previous session ( question due to medication). Pt tolerating exercises much more this session and motivated. Pt and spouse expressed gratitude for second visit today.   Follow Up Recommendations  Home health OT    Equipment Recommendations  None recommended by OT    Recommendations for Other Services      Precautions / Restrictions Precautions Precautions: Shoulder Type of Shoulder Precautions: active Shoulder Interventions: Shoulder sling/immobilizer;Off for dressing/bathing/exercises;For comfort Precaution Comments: shoulder protocol handout provided Required Braces or Orthoses: Sling Restrictions LUE Weight Bearing: Non weight bearing       Mobility Bed Mobility                  Transfers                      Balance                                   ADL                                                Vision                     Perception     Praxis      Cognition   Behavior During Therapy: WFL for tasks assessed/performed Overall Cognitive Status: Impaired/Different from baseline Area of Impairment: Memory;Attention   Current Attention Level: Sustained Memory: Decreased short-term memory          General Comments: questions if memory due to medication. pt with iv medication provided prior    Extremity/Trunk Assessment               Exercises Shoulder Exercises Shoulder Flexion: AAROM;Self ROM;Left;10 reps;Supine Shoulder ABduction: AAROM;Left;10 reps;Supine Elbow Flexion: Left;10 reps;Supine;AROM Wrist Flexion: AROM;10 reps;Left;Supine Digit Composite Flexion: AROM;Left;10 reps;Supine Other  Exercises Other Exercises: external rotation full 30 degrees AAROM supine Donning/doffing shirt without moving shoulder:  (mod cueing) Donning/doffing sling/immobilizer: Supervision/safety (mod cueing) Correct positioning of sling/immobilizer: Minimal assistance ROM for elbow, wrist and digits of operated UE: Moderate assistance Sling wearing schedule (on at all times/off for ADL's): Modified independent Proper positioning of operated UE when showering: Moderate assistance Positioning of UE while sleeping: Moderate assistance   Shoulder Instructions Shoulder Instructions Donning/doffing shirt without moving shoulder:  (mod cueing) Donning/doffing sling/immobilizer: Supervision/safety (mod cueing) Correct positioning of sling/immobilizer: Minimal assistance ROM for elbow, wrist and digits of operated UE: Moderate assistance Sling wearing schedule (on at all times/off for ADL's): Modified independent Proper positioning of operated UE when showering: Moderate assistance Positioning of UE while sleeping: Moderate assistance     General Comments      Pertinent Vitals/ Pain       Pain Assessment: No/denies pain  Home Living                                          Prior  Functioning/Environment              Frequency Min 3X/week     Progress Toward Goals  OT Goals(current goals can now be found in the care plan section)  Progress towards OT goals: Progressing toward goals  Acute Rehab OT Goals Patient Stated Goal: it was better this time with that medication OT Goal Formulation: With patient/family Time For Goal Achievement: 05/08/15 Potential to Achieve Goals: Good ADL Goals Pt Will Perform Grooming: with modified independence;standing Pt Will Perform Upper Body Bathing: with modified independence;sitting Pt Will Transfer to Toilet: with modified independence;regular height toilet Additional ADL Goal #1: Pt will return demonstrates 30/60/90 AROM/ PROM L  UE exercises with spouse MOD I  Plan Discharge plan remains appropriate    Co-evaluation                 End of Session Equipment Utilized During Treatment: Gait belt   Activity Tolerance Patient tolerated treatment well   Patient Left in bed;with call bell/phone within reach;with family/visitor present   Nurse Communication Mobility status;Precautions        Time: 3428-7681 OT Time Calculation (min): 13 min  Charges: OT General Charges $OT Visit: 1 Procedure OT Treatments $Therapeutic Exercise: 8-22 mins  [name] 04/24/2015, 1:13 PM  [name]   OTR/L Pager: [phone] Office: [phone] .

## 2015-04-24 NOTE — Care Management Note (Addendum)
Case Management Note  Patient Details  Name: Terri [name] MRN: [id number] Date of Birth: 04-22-1951  Subjective/Objective:                  LEFT TOTAL SHOULDER ARTHROPLASTY (Left)  Action/Plan: Cm spoke to pt at the bedside and offered choice for [name] services. Pt to have [place] RN/OT and chose to have [facility]. Pt denies the need for any DME and states that her husband will be at home with her to provide support. Pt anticipates OT to see her again today and tomorrow. CM called Tiffany with [facility] to advise of referral and information accepted. Pt denies any medication needs and states that she has no difficulty obtaining her medications. No further d/c planning needs identified. CM still awaiting MD order for [facility] OT/RN and [facility].   Expected Discharge Date:  04/25/15               Expected Discharge Plan:  [place]  In-House Referral:     Discharge planning Services  CM Consult  Post Acute Care Choice:  Home Health Choice offered to:  Patient  DME Arranged:  N/A DME Agency:  [place]:  OT/RN [place] Agency:  [place]  Status of Service:  In process, will continue to follow  Medicare Important Message Given:    Date Medicare IM Given:    Medicare IM give by:    Date Additional Medicare IM Given:    Additional Medicare Important Message give by:     If discussed at [place] of Stay Meetings, dates discussed:    Additional Comments:  Guido Sander, RN 04/24/2015, 11:41 AM

## 2015-04-24 NOTE — Progress Notes (Signed)
OT at bedside working with PT. CM called and spoke with  Ortho PA [facility] to advise of order needed for [facility] and RN if she agrees. Message accepted and she states that she will enter the order.

## 2015-04-24 NOTE — Evaluation (Signed)
Occupational Therapy Evaluation Patient Details Name: Terri [name] MRN: [id number] DOB: 08-12-1950 Today's Date: 04/24/2015    History of Present Illness 64 yo female s/p L TSA with active protocol   Clinical Impression   Patient is s/p L TSA surgery resulting in functional limitations due to the deficits listed below (see OT problem list). Pt was independent PTA.  Patient will benefit from skilled OT acutely to increase independence and safety with ADLS to allow discharge [place] per pt and family request ( informed prior to surgery by MD this would be available per patient). OT to return today to address don /doff clothing and second set of exercises.     Follow Up Recommendations  Home health OT    Equipment Recommendations  None recommended by OT    Recommendations for Other Services       Precautions / Restrictions Precautions Precautions: Shoulder Type of Shoulder Precautions: active Shoulder Interventions: Shoulder sling/immobilizer;Off for dressing/bathing/exercises;For comfort Precaution Comments: shoulder protocol handout provided Required Braces or Orthoses: Sling Restrictions LUE Weight Bearing: Non weight bearing      Mobility Bed Mobility Overal bed mobility: Modified Independent             General bed mobility comments: incr time and guarding L UE  Transfers Overall transfer level: Needs assistance   Transfers: Sit to/from Stand Sit to Stand: Min guard         General transfer comment: guarded but able to complete    Balance                                            ADL Overall ADL's : Needs assistance/impaired Eating/Feeding: Set up   Grooming: Set up;Oral care Grooming Details (indicate cue type and reason): pt educated on using L UE with oral care at sink level Upper Body Bathing: Moderate assistance;Sitting Upper Body Bathing Details (indicate cue type and reason): educated spouse on (A) level              Toilet Transfer: Min Administrator, arts and Hygiene: Min guard       Functional mobility during ADLs: Min guard General ADL Comments: pt completed a standard flight of stairs as precursor to d/c home. Pt and spouse educated on exercises. Pt and wife requesting OT a second session prior to d/c today     Vision     Perception     Praxis      Pertinent Vitals/Pain       Hand Dominance Right   Extremity/Trunk Assessment Upper Extremity Assessment Upper Extremity Assessment: LUE deficits/detail LUE Deficits / Details: s/p L TSA   Lower Extremity Assessment Lower Extremity Assessment: Overall WFL for tasks assessed   Cervical / Trunk Assessment Cervical / Trunk Assessment: Normal   Communication Communication Communication: No difficulties   Cognition Arousal/Alertness: Awake/alert Behavior During Therapy: WFL for tasks assessed/performed Overall Cognitive Status: Within Functional Limits for tasks assessed                     General Comments       Exercises Exercises: Other exercises Other Exercises Other Exercises: educated on active protocol of  flexion 90 external rotation 30 and abduction 60. pt with poor tolerance to pain and not able to achieve full range.  Other Exercises: pt demonstrates 5 reps of flexion ~30 degrees, abduction ~  30 degrees and external rotation ~15 degrees at this time. Pt very guarded. pt educated on purse lip breathing to assist with tolerance of exercises Other Exercises: pt educated on use of spirometer   Shoulder Instructions      Home Living Family/patient expects to be discharged to:: Private residence Living Arrangements: Spouse/significant other Available Help at Discharge: Available 24 hours/day;Family Type of Home: House Home Access: Stairs to enter [employer] of Steps: 15 Entrance Stairs-Rails: Right Home Layout: Two level;Bed/bath upstairs;Full bath on main level      Bathroom Shower/Tub: Occupational psychologist: Handicapped height     Home Equipment: None   Additional Comments: pt has a recliner downstairs for sleeping if needed      Prior Functioning/Environment Level of Independence: Independent             OT Diagnosis: Acute pain   OT Problem List: Decreased strength;Decreased activity tolerance;Impaired balance (sitting and/or standing);Decreased safety awareness;Pain   OT Treatment/Interventions: Self-care/ADL training;Therapeutic exercise;DME and/or AE instruction;Therapeutic activities;Patient/family education;Balance training    OT Goals(Current goals can be found in the care plan section) Acute Rehab OT Goals Patient Stated Goal: He said I would have someone at my house for 2 weeks can you make sure that happens? OT Goal Formulation: With patient/family Time For Goal Achievement: 05/08/15 Potential to Achieve Goals: Good  OT Frequency: Min 3X/week   Barriers to D/C:            Co-evaluation              End of Session Equipment Utilized During Treatment: Gait belt Nurse Communication: Mobility status;Precautions  Activity Tolerance: Patient tolerated treatment well Patient left: in bed;with call bell/phone within reach;with family/visitor present   Time: 0727-0827 OT Time Calculation (min): 60 min Charges:  OT General Charges $OT Visit: 1 Procedure OT Evaluation $Initial OT Evaluation Tier I: 1 Procedure OT Treatments $Self Care/Home Management : 8-22 mins $Therapeutic Exercise: 23-37 mins G-Codes:    Terri [name] April 29, 2015, 8:45 AM  [name]   OTR/L Pager: [phone] Office: [phone] .

## 2015-04-25 NOTE — Progress Notes (Signed)
   Subjective: 2 Days Post-Op Procedure(s) (LRB): LEFT TOTAL SHOULDER ARTHROPLASTY (Left)  Pt feeling much better this morning Last 2 days were 'rough'  Denies any new symptoms or issues Patient reports pain as mild.  Objective:   VITALS:   Filed Vitals:   04/25/15 0612  BP: 112/60  Pulse: 73  Temp: 97.6 F (36.4 C)  Resp: 18    Left shoulder dressing and sling in place nv intact distally No rashes or edema  LABS  Recent Labs  04/24/15 0608  HGB 11.1*  HCT 33.3*     Recent Labs  04/24/15 0608  NA 131*  K 4.1  BUN 9  CREATININE 0.65  GLUCOSE 146*     Assessment/Plan: 2 Days Post-Op Procedure(s) (LRB): LEFT TOTAL SHOULDER ARTHROPLASTY (Left) D/c home today after therapy F/u in 2 weeks Pt in agreement Pain management as needed    Merla Riches, MPAS, PA-C  04/25/2015, 7:29 AM

## 2015-04-25 NOTE — Progress Notes (Signed)
AVS printout reviewed with patient and her husband including pain medication along with times to take next doses, dressing management, to elevate her arm and follow-up appointment. Give her a copy AVS printout with notes and her prescriptions. All questions answered. Husband took personal possessions to the car and will meet [employer] exit. Tech will take patient via w/c to [facility] tower exit to meet her husband to drive home.

## 2015-04-25 NOTE — Progress Notes (Signed)
Occupational Therapy Treatment Patient Details Name: Terri [name] MRN: [id number] DOB: February 10, 1951 Today's Date: 04/25/2015    History of present illness 64 yo female s/p L TSA with active protocol   OT comments  Patient making progress towards goals. Less pain overall today so more successful session with focus on shoulder active protocol and ADLs in preparation for discharge home today.  Follow Up Recommendations  Home health OT    Equipment Recommendations  None recommended by OT    Recommendations for Other Services      Precautions / Restrictions Precautions Precautions: Shoulder Type of Shoulder Precautions: active Shoulder Interventions: Shoulder sling/immobilizer;Off for dressing/bathing/exercises;For comfort Precaution Comments: shoulder protocol handout provided Required Braces or Orthoses: Sling Restrictions Weight Bearing Restrictions: Yes LUE Weight Bearing: Non weight bearing       Mobility Bed Mobility Overal bed mobility: Modified Independent             General bed mobility comments: incr time and guarding L UE  Transfers Overall transfer level: Needs assistance Equipment used: None Transfers: Sit to/from Stand Sit to Stand: Supervision              Balance                                   ADL Overall ADL's : Needs assistance/impaired     Grooming: Set up;Wash/dry hands;Standing   Upper Body Bathing: Minimal assitance;Sitting (reviewed technique)       Upper Body Dressing : Minimal assistance;Sitting   Lower Body Dressing: Set up;Sit to/from stand   Toilet Transfer: Supervision/safety;Ambulation   Toileting- Clothing Manipulation and Hygiene: Supervision/safety       Functional mobility during ADLs: Supervision/safety General ADL Comments: REviewed all exercises with patient and spouse and performed a set of each. Also educated and practiced getting dressed in preparation for discharge home today.       Vision                     Perception     Praxis      Cognition   Behavior During Therapy: WFL for tasks assessed/performed Overall Cognitive Status: Within Functional Limits for tasks assessed                       Extremity/Trunk Assessment  Upper Extremity Assessment Upper Extremity Assessment: LUE deficits/detail LUE Deficits / Details: s/p L TSA            Exercises Shoulder Exercises Shoulder Flexion: AAROM;Self ROM;Left;10 reps;Seated Shoulder ABduction: AAROM;Self ROM;Left;10 reps;Seated Elbow Flexion: AROM;Left;10 reps;Seated Wrist Flexion: AROM;Left;10 reps;Seated Digit Composite Flexion: AROM;Left;10 reps;Seated Other Exercises Other Exercises: external rotation full 30 degrees AAROM supine Donning/doffing shirt without moving shoulder: Set-up (assistance with top button; pt able to fasten other buttons) Method for sponge bathing under operated UE: Minimal assistance Donning/doffing sling/immobilizer: Supervision/safety Correct positioning of sling/immobilizer: Supervision/safety ROM for elbow, wrist and digits of operated UE: Supervision/safety Sling wearing schedule (on at all times/off for ADL's): Modified independent Proper positioning of operated UE when showering: Supervision/safety Positioning of UE while sleeping: Supervision/safety   Shoulder Instructions Shoulder Instructions Donning/doffing shirt without moving shoulder: Set-up (assistance with top button; pt able to fasten other buttons) Method for sponge bathing under operated UE: Minimal assistance Donning/doffing sling/immobilizer: Supervision/safety Correct positioning of sling/immobilizer: Supervision/safety ROM for elbow, wrist and digits of operated UE: Supervision/safety Sling wearing schedule (on at all  times/off for ADL's): Modified independent Proper positioning of operated UE when showering: Supervision/safety Positioning of UE while sleeping:  Supervision/safety     General Comments      Pertinent Vitals/ Pain       Pain Assessment: 0-10 Pain Score: 3  Pain Location: L shoulder Pain Descriptors / Indicators: Aching;Operative site guarding;Sore Pain Intervention(s): Limited activity within patient's tolerance;Monitored during session;Premedicated before session  [place] expects to be discharged to:: Private residence Living Arrangements: Spouse/significant other Available Help at Discharge: Available 24 hours/day;Family Type of Home: House Home Access: Stairs to enter [employer] of Steps: 15 Entrance Stairs-Rails: Right Home Layout: Two level;Bed/bath upstairs;Full bath on main level     Bathroom Shower/Tub: Occupational psychologist: Handicapped height     Home Equipment: None   Additional Comments: pt has a recliner downstairs for sleeping if needed      Prior Functioning/Environment Level of Independence: Independent            Frequency Min 3X/week     Progress Toward Goals  OT Goals(current goals can now be found in the care plan section)  Progress towards OT goals: Progressing toward goals  Acute Rehab OT Goals Patient Stated Goal: to go home today  Plan Discharge plan remains appropriate    Co-evaluation                 End of Session Equipment Utilized During Treatment: Other (comment) (L sling)   Activity Tolerance Patient tolerated treatment well   Patient Left in bed;with call bell/phone within reach;with family/visitor present   Nurse Communication          Time: 4970-2637 OT Time Calculation (min): 41 min  Charges: OT General Charges $OT Visit: 1 Procedure OT Treatments $Self Care/Home Management : 8-22 mins $Therapeutic Exercise: 23-37 mins  Svara Twyman A 04/25/2015, 11:52 AM

## 2015-04-25 NOTE — Discharge Instructions (Signed)
Ice to the shoulder constantly.  Do not lift push or pull with the left arm.  Keep the incision covered and clean and dry for one week, then ok to get wet in the shower.  Do exercises every hour while awake, lap slides, pendulums, hand and wrist and elbow active ROM  Follow up in two weeks in the office  [phone] _________________________________________________________________________________________________________________________________ Information on my medicine - ELIQUIS (apixaban)  This medication education was reviewed with me or my healthcare representative as part of my discharge preparation.  The pharmacist that spoke with me during my hospital stay was:  Myrene Galas, [facility]  Why was Eliquis prescribed for you? Eliquis was prescribed for you to reduce the risk of a blood clot forming that can cause a stroke if you have a medical condition called atrial fibrillation (a type of irregular heartbeat).  What do You need to know about Eliquis ? Take your Eliquis TWICE DAILY - one tablet in the morning and one tablet in the evening with or without food. If you have difficulty swallowing the tablet whole please discuss with your pharmacist how to take the medication safely.  Take Eliquis exactly as prescribed by your doctor and DO NOT stop taking Eliquis without talking to the doctor who prescribed the medication.  Stopping may increase your risk of developing a stroke.  Refill your prescription before you run out.  After discharge, you should have regular check-up appointments with your healthcare provider that is prescribing your Eliquis.  In the future your dose may need to be changed if your kidney function or weight changes by a significant amount or as you get older.  What do you do if you miss a dose? If you miss a dose, take it as soon as you remember on the same day and resume taking twice daily.  Do not take more than one dose of ELIQUIS at the same time to make up  a missed dose.  Important Safety Information A possible side effect of Eliquis is bleeding. You should call your healthcare provider right away if you experience any of the following: ? Bleeding from an injury or your nose that does not stop. ? Unusual colored urine (red or dark brown) or unusual colored stools (red or black). ? Unusual bruising for unknown reasons. ? A serious fall or if you hit your head (even if there is no bleeding).  Some medicines may interact with Eliquis and might increase your risk of bleeding or clotting while on Eliquis. To help avoid this, consult your healthcare provider or pharmacist prior to using any new prescription or non-prescription medications, including herbals, vitamins, non-steroidal anti-inflammatory drugs (NSAIDs) and supplements.  This website has more information on Eliquis (apixaban): http://www.eliquis.com/eliquis/home

## 2015-04-25 NOTE — Discharge Summary (Signed)
Physician Discharge Summary   Patient ID: Terri [name] MRN: [id number] DOB/AGE: 66-Jan-1952 66 y.o.  Admit date: 04/23/2015 Discharge date: 04/25/2015  Admission Diagnoses:  Active Problems:   S/P shoulder replacement   Discharge Diagnoses:  Same   Surgeries: Procedure(s): LEFT TOTAL SHOULDER ARTHROPLASTY on 04/23/2015   Consultants: PT/OT  Discharged Condition: Stable  Hospital Course: Terri [name] is an 64 y.o. female who was admitted 04/23/2015 with a chief complaint of No chief complaint on file. , and found to have a diagnosis of <principal problem not specified>.  They were brought to the operating room on 04/23/2015 and underwent the above named procedures.    The patient had an uncomplicated hospital course and was stable for discharge.  Recent vital signs:  Filed Vitals:   04/25/15 0612  BP: 112/60  Pulse: 73  Temp: 97.6 F (36.4 C)  Resp: 18    Recent laboratory studies:  Results for orders placed or performed during the hospital encounter of 04/23/15  Hemoglobin and hematocrit, blood  Result Value Ref Range   Hemoglobin 11.1 (L) 12.0 - 15.0 g/dL   HCT 33.3 (L) 36.0 - 97.0 %  Basic metabolic panel  Result Value Ref Range   Sodium 131 (L) 135 - 145 mmol/L   Potassium 4.1 3.5 - 5.1 mmol/L   Chloride 96 (L) 101 - 111 mmol/L   CO2 29 22 - 32 mmol/L   Glucose, Bld 146 (H) 65 - 99 mg/dL   BUN 9 6 - 20 mg/dL   Creatinine, Ser 0.65 0.44 - 1.00 mg/dL   Calcium 8.5 (L) 8.9 - 10.3 mg/dL   GFR calc non Af Amer >60 >60 mL/min   GFR calc Af Amer >60 >60 mL/min   Anion gap 6 5 - 15    Discharge Medications:     Medication List    TAKE these medications        acetaminophen 325 MG tablet  Commonly known as:  TYLENOL  Take 650 mg by mouth every 6 (six) hours as needed for mild pain or moderate pain.     apixaban 5 MG Tabs tablet  Commonly known as:  ELIQUIS  Take 1 tablet (5 mg total) by mouth 2 (two) times daily.     Biotin 5000 MCG Caps  Take 1  capsule by mouth daily.     fish oil-omega-3 fatty acids 1000 MG capsule  Take 1 g by mouth daily.     flecainide 100 MG tablet  Commonly known as:  TAMBOCOR  Take 1 tablet (100 mg total) by mouth 2 (two) times daily.     HYDROcodone-acetaminophen 5-325 MG per tablet  Commonly known as:  NORCO  Take 1-2 tablets by mouth every 4 (four) hours as needed for moderate pain.     levothyroxine 50 MCG tablet  Commonly known as:  SYNTHROID, LEVOTHROID  Take 50 mcg by mouth daily.     methocarbamol 500 MG tablet  Commonly known as:  ROBAXIN  Take 1 tablet (500 mg total) by mouth 3 (three) times daily as needed.     multivitamin capsule  Take 1 capsule by mouth daily.     sertraline 100 MG tablet  Commonly known as:  ZOLOFT  Take 150 mg by mouth daily.     traZODone 50 MG tablet  Commonly known as:  DESYREL  Take 25 mg by mouth at bedtime.     tretinoin 0.05 % cream  Commonly known as:  RETIN-A  Apply topically  at bedtime.     Vitamin D3 5000 UNITS Caps  Take 1 capsule by mouth daily.        Diagnostic Studies: Dg Shoulder Left Port  04/23/2015   CLINICAL DATA:  Left total shoulder arthroplasty.  EXAM: LEFT SHOULDER - 1 VIEW  COMPARISON:  None  FINDINGS: Hardware components of a left shoulder hemi arthroplasty identified. No periprosthetic fracture or subluxation noted. A small amount of gas is noted within the joint space.  IMPRESSION: 1. Status post left shoulder arthroplasty.   Electronically Signed   By: Kerby Moors M.D.   On: 04/23/2015 10:49    Disposition: 01-Home or Self Care        Follow-up Information    Follow up with NORRIS,STEVEN R, MD. Call in 2 weeks.   Specialty:  Orthopedic Surgery   Why:  [phone]   Contact information:   [address] [place] [address] [phone]       Follow up with [facility].   Why:  Occupational therapy and registered nurse; above agency will call you within 24-48 hours to  schedule visit. Please call number listed above for any questions.    Contact information:   [place] [address] [phone]        Signed: Ventura Bruns 04/25/2015, 7:30 AM

## 2015-04-27 ENCOUNTER — Encounter (HOSPITAL_COMMUNITY): Payer: Self-pay | Admitting: Orthopedic Surgery

## 2015-05-12 ENCOUNTER — Encounter: Payer: Self-pay | Admitting: Obstetrics and Gynecology

## 2015-05-12 ENCOUNTER — Ambulatory Visit (INDEPENDENT_AMBULATORY_CARE_PROVIDER_SITE_OTHER): Payer: PPO | Admitting: Obstetrics and Gynecology

## 2015-05-12 VITALS — BP 120/76 | HR 66 | Resp 12 | Ht 63.5 in | Wt 112.8 lb

## 2015-05-12 DIAGNOSIS — Z01419 Encounter for gynecological examination (general) (routine) without abnormal findings: Secondary | ICD-10-CM

## 2015-05-12 DIAGNOSIS — M8580 Other specified disorders of bone density and structure, unspecified site: Secondary | ICD-10-CM | POA: Diagnosis not present

## 2015-05-12 DIAGNOSIS — Z1211 Encounter for screening for malignant neoplasm of colon: Secondary | ICD-10-CM

## 2015-05-12 DIAGNOSIS — R1900 Intra-abdominal and pelvic swelling, mass and lump, unspecified site: Secondary | ICD-10-CM

## 2015-05-12 NOTE — Patient Instructions (Signed)

## 2015-05-12 NOTE — Progress Notes (Signed)
Patient ID: Terri [name], female   DOB: 1950/09/04, 64 y.o.   MRN: [id number] 64 y.o. G31P2002 Married Caucasian female here for annual exam.    Just had total left shoulder replacement.  Was doing monthly shoulder steroid injection.   Misses her estrogen.  Stopped estrogen due to atrial fibrillation/flutter. On Eliquis currently.   PCP:  R.Marcellus Scott, MD  No LMP recorded. Patient has had a hysterectomy.          Sexually active: No. female The current method of family planning is status post hysterectomy--still has ovaries.    Exercising: Yes.    cardio & weights, pilates 6 days/week.  Smoker:  no  Health Maintenance: Pap:  2002 Neg History of abnormal Pap:  no MMG:  12-04-14 Implants, 3D Density Cat.B/Neg/BiRads 1:[facility]. Colonoscopy:  2014 normal with Dr. Wynetta Emery but not totally completed due to patient going into A.Fib. BMD:   06/2013  Result  Osteopenia of hip (T score -1.7)--stable and normal spine: [facility]. TDaP:  2011 Screening Labs:  Hb today: PCP, Urine today: PCP   reports that she has never smoked. She has never used smokeless tobacco. She reports that she drinks about 3.0 oz of alcohol per week. She reports that she does not use illicit drugs.  Past Medical History  Diagnosis Date  . Depression   . Hypothyroidism   . Dyspareunia   . Fibroid   . Arthritis     lt shoulder  . Paroxysmal atrial fibrillation (HCC)   . Atrial flutter ([place])   . Dysrhythmia     a fib  . Heart murmur     Past Surgical History  Procedure Laterality Date  . Abdominal hysterectomy    . Breast enhancement surgery  83,11  . Fracture surgery Right     wrist  . Cosmetic surgery  2003    face  . Augmentation mammaplasty    . Flexible sigmoidoscopy N/A 07/29/2013    Procedure: FLEXIBLE SIGMOIDOSCOPY;  Surgeon: [name], MD;  Location: [facility];  Service: Endoscopy;  Laterality: N/A;  . Tee without cardioversion N/A 12/22/2013    Procedure:  TRANSESOPHAGEAL ECHOCARDIOGRAM (TEE);  Surgeon: [name], MD;  Location: [facility] ENDOSCOPY;  Service: Cardiovascular;  Laterality: N/A;  . Ablation  12-23-2013    PVI and CTI by Dr Rayann Heman  . Atrial fibrillation ablation N/A 12/23/2013    Procedure: ATRIAL FIBRILLATION ABLATION;  Surgeon: Coralyn Mark, MD;  Location: [place] CATH LAB;  Service: Cardiovascular;  Laterality: N/A;  . Tonsillectomy    . Total shoulder arthroplasty Left 04/23/2015    Procedure: LEFT TOTAL SHOULDER ARTHROPLASTY;  Surgeon: Netta Cedars, MD;  Location: [place];  Service: Orthopedics;  Laterality: Left;    Current Outpatient Prescriptions  Medication Sig Dispense Refill  . acetaminophen (TYLENOL) 325 MG tablet Take 650 mg by mouth every 6 (six) hours as needed for mild pain or moderate pain.    Marland Kitchen apixaban (ELIQUIS) 5 MG TABS tablet Take 1 tablet (5 mg total) by mouth 2 (two) times daily. 180 tablet 3  . Biotin 5000 MCG CAPS Take 1 capsule by mouth daily.    . Cholecalciferol (VITAMIN D3) 5000 UNITS CAPS Take 1 capsule by mouth daily.    . fish oil-omega-3 fatty acids 1000 MG capsule Take 1 g by mouth daily.    . flecainide (TAMBOCOR) 100 MG tablet Take 1 tablet (100 mg total) by mouth 2 (two) times daily. 180 tablet 3  .  levothyroxine (SYNTHROID, LEVOTHROID) 50 MCG tablet Take 50 mcg by mouth daily.    . Multiple Vitamin (MULTIVITAMIN) capsule Take 1 capsule by mouth daily.    . sertraline (ZOLOFT) 100 MG tablet Take 150 mg by mouth daily.    . traZODone (DESYREL) 50 MG tablet Take 25 mg by mouth at bedtime.    . tretinoin (RETIN-A) 0.05 % cream Apply topically at bedtime. (Patient taking differently: Apply 1 application topically 2 (two) times a week. AT BEDTIME) 45 g 2   No current facility-administered medications for this visit.    Family History  Problem Relation Age of Onset  . Cancer Mother   . Hypertension Mother   . Thyroid disease Mother   . Cancer Paternal Grandmother   . Breast cancer Paternal Grandmother    . Thyroid disease Sister   . Stroke Maternal Grandmother     ROS:  Pertinent items are noted in HPI.  Otherwise, a comprehensive ROS was negative.  Exam:   BP 120/76 mmHg  Pulse 66  Resp 12  Ht 5' 3.5" (1.613 m)  Wt 112 lb 12.8 oz (51.166 kg)  BMI 19.67 kg/m2    General appearance: alert, cooperative and appears stated age Head: Normocephalic, without obvious abnormality, atraumatic Neck: no adenopathy, supple, symmetrical, trachea midline and thyroid normal to inspection and palpation Lungs: clear to auscultation bilaterally Breasts: normal appearance, no masses or tenderness, Inspection negative, No nipple retraction or dimpling, No nipple discharge or bleeding, No axillary or supraclavicular adenopathy, bilateral implants Heart: regular rate and rhythm Abdomen: soft, non-tender; bowel sounds normal; no masses,  no organomegaly Extremities: extremities normal, atraumatic, no cyanosis or edema Skin: Skin color, texture, turgor normal. No rashes or lesions Lymph nodes: Cervical, supraclavicular, and axillary nodes normal. No abnormal inguinal nodes palpated Neurologic: Grossly normal  Pelvic: External genitalia:  no lesions              Urethra:  normal appearing urethra with no masses, tenderness or lesions              Bartholins and Skenes: normal                 Vagina:  Atrophic changes noted.              Cervix: absent              Pap taken: No. Bimanual Exam:  Uterus:  uterus absent              Adnexa: I feel a firmness at the vaginal cuff.  Had had normal pelvic ultrasound.               Rectovaginal: Yes.  .  Confirms.              Anus:  normal sphincter tone, no lesions  Chaperone was present for exam.  Assessment:   Well woman visit with normal exam. Status post hysterectomy.  Pelvic mass?  I feel this is most likely bowel at the vaginal cuff.  Normal pelvic ultrasound.  Patient had not completed colon cancer screening due to her atrial fibrillation  episode. Atrial fibrillation.  On Eliquis.   Plan: Yearly mammogram recommended after age 29.  Recommended self breast exam.  Pap and HR HPV as above. Discussed Calcium, Vitamin D, regular exercise program including cardiovascular and weight bearing exercise. Bone density during the next year.  Labs performed.  No..  Refills given on medications.  No..    I am  referring patient to gastroenterology to complete colon cancer screening. This was not done due to atrial fibrillation during her previous study. Virtual CT? Follow up annually and prn.      After visit summary provided.

## 2015-05-19 ENCOUNTER — Ambulatory Visit: Payer: PPO | Admitting: Internal Medicine

## 2015-05-19 ENCOUNTER — Telehealth: Payer: Self-pay | Admitting: Obstetrics and Gynecology

## 2015-05-19 NOTE — Telephone Encounter (Signed)
Call to patient to check preference in GI provider. Patient previously seen with Dr Wynetta Emery in 2014. Dr Quincy Simmonds referred to Dr Oletta Lamas.  To see Dr Oletta Lamas, records from Dr Durenda Age office must be released prior to scheduling. If patient prefers to schedule with Dr Wynetta Emery, it may be more expeditious and Dr Quincy Simmonds is ok with either choice. Left voicemail to call back.

## 2015-05-21 NOTE — Telephone Encounter (Signed)
Patient called back and she said that she would prefer to see Dr. Oletta Lamas and she's okay to wait for a call Monday when [place] returns. Best contact # [phone]

## 2015-05-26 ENCOUNTER — Encounter: Payer: Self-pay | Admitting: Internal Medicine

## 2015-05-26 ENCOUNTER — Ambulatory Visit (INDEPENDENT_AMBULATORY_CARE_PROVIDER_SITE_OTHER): Payer: PPO | Admitting: Internal Medicine

## 2015-05-26 VITALS — BP 102/68 | HR 61 | Ht 63.0 in | Wt 112.6 lb

## 2015-05-26 DIAGNOSIS — I480 Paroxysmal atrial fibrillation: Secondary | ICD-10-CM

## 2015-05-26 MED ORDER — FLECAINIDE ACETATE 50 MG PO TABS: 50.0000 mg | ORAL_TABLET | Freq: Two times a day (BID) | ORAL | Status: DC

## 2015-05-26 NOTE — Patient Instructions (Addendum)
Medication Instructions: 1) Decrease flecainide to 50 mg one tablet by mouth twice daily  Labwork: - none  Procedures/Testing: - none  Follow-Up: - Your physician wants you to follow-up in: 1 year with Dr. Rayann Heman. You will receive a reminder letter in the mail two months in advance. If you don't receive a letter, please call our office to schedule the follow-up appointment.  Any Additional Special Instructions Will Be Listed Below (If Applicable). - none

## 2015-05-26 NOTE — Progress Notes (Signed)
PCP: [name], MD Primary Cardiologist:  [name] is a 64 y.o. female who presents today for routine electrophysiology followup.   She is doing well.  She started flecainide 8/16 after developing brief episodic palpitations.  Today, she denies symptoms of  chest pain, shortness of breath,  lower extremity edema, dizziness, presyncope, or syncope.  The patient is otherwise without complaint today.   Past Medical History  Diagnosis Date  . Depression   . Hypothyroidism   . Dyspareunia   . Fibroid   . Arthritis     lt shoulder  . Paroxysmal atrial fibrillation (HCC)   . Atrial flutter ([place])   . Dysrhythmia     a fib  . Heart murmur    Past Surgical History  Procedure Laterality Date  . Abdominal hysterectomy    . Breast enhancement surgery  83,11  . Fracture surgery Right     wrist  . Cosmetic surgery  2003    face  . Augmentation mammaplasty    . Flexible sigmoidoscopy N/A 07/29/2013    Procedure: FLEXIBLE SIGMOIDOSCOPY;  Surgeon: [name], MD;  Location: [facility];  Service: Endoscopy;  Laterality: N/A;  . Tee without cardioversion N/A 12/22/2013    Procedure: TRANSESOPHAGEAL ECHOCARDIOGRAM (TEE);  Surgeon: [name], MD;  Location: [facility] ENDOSCOPY;  Service: Cardiovascular;  Laterality: N/A;  . Ablation  12-23-2013    PVI and CTI by Dr Rayann Heman  . Atrial fibrillation ablation N/A 12/23/2013    Procedure: ATRIAL FIBRILLATION ABLATION;  Surgeon: Coralyn Mark, MD;  Location: [place] CATH LAB;  Service: Cardiovascular;  Laterality: N/A;  . Tonsillectomy    . Total shoulder arthroplasty Left 04/23/2015    Procedure: LEFT TOTAL SHOULDER ARTHROPLASTY;  Surgeon: Netta Cedars, MD;  Location: [place];  Service: Orthopedics;  Laterality: Left;    ROS- all systems are reviewed and negatives except as per HPI above  Current Outpatient Prescriptions  Medication Sig Dispense Refill  . acetaminophen (TYLENOL) 325 MG tablet Take 650 mg by mouth  every 6 (six) hours as needed for mild pain or moderate pain.    Marland Kitchen apixaban (ELIQUIS) 5 MG TABS tablet Take 1 tablet (5 mg total) by mouth 2 (two) times daily. 180 tablet 3  . Biotin 5000 MCG CAPS Take 1 capsule by mouth daily.    . Cholecalciferol (VITAMIN D3) 5000 UNITS CAPS Take 1 capsule by mouth daily.    . fish oil-omega-3 fatty acids 1000 MG capsule Take 1 g by mouth daily.    . flecainide (TAMBOCOR) 100 MG tablet Take 1 tablet (100 mg total) by mouth 2 (two) times daily. 180 tablet 3  . levothyroxine (SYNTHROID, LEVOTHROID) 50 MCG tablet Take 50 mcg by mouth daily.    . Multiple Vitamin (MULTIVITAMIN) capsule Take 1 capsule by mouth daily.    . sertraline (ZOLOFT) 100 MG tablet Take 150 mg by mouth daily.    . traZODone (DESYREL) 50 MG tablet Take 25-50 mg by mouth at bedtime.     . tretinoin (RETIN-A) 0.05 % cream Apply topically at bedtime. (Patient taking differently: Apply 1 application topically 2 (two) times a week. AT BEDTIME) 45 g 2   No current facility-administered medications for this visit.    Physical Exam: Filed Vitals:   05/26/15 1118  BP: 102/68  Pulse: 61  Height: 5\' 3"  (1.6 m)  Weight: 112 lb 9.6 oz (51.075 kg)    GEN- The patient is well  appearing, alert and oriented x 3 today.   Head- normocephalic, atraumatic Eyes-  Sclera clear, conjunctiva pink Ears- hearing intact Oropharynx- clear Lungs- Clear to ausculation bilaterally, normal work of breathing Heart- Regular rate and rhythm, no murmurs, rubs or gallops, PMI not laterally displaced GI- soft, NT, ND, + BS Extremities- no clubbing, cyanosis, or edema  ekg today reveals sinus rhythm PR 228, incomplete RBBB  Assessment and Plan:  1. Afib/ atrial flutter Doing well s/p ablation Reduce flecanide to 50mg  BID today chads2vasc score is 2.  On eliquis  Return to see me in 1 year  Thompson Grayer MD, [facility] 05/26/2015 12:01 PM

## 2015-06-02 NOTE — Telephone Encounter (Signed)
Patient returning your call. Best # to reach [phone]

## 2015-08-18 DIAGNOSIS — M25512 Pain in left shoulder: Secondary | ICD-10-CM | POA: Diagnosis not present

## 2015-08-18 MED FILL — SERTRALINE HCL 100 MG TAB: 100 | 90 days supply | Qty: 135 | Fill #3

## 2015-08-19 DIAGNOSIS — M25522 Pain in left elbow: Secondary | ICD-10-CM | POA: Diagnosis not present

## 2015-08-19 DIAGNOSIS — Z471 Aftercare following joint replacement surgery: Secondary | ICD-10-CM | POA: Diagnosis not present

## 2015-08-19 DIAGNOSIS — Z96612 Presence of left artificial shoulder joint: Secondary | ICD-10-CM | POA: Diagnosis not present

## 2015-08-20 MED FILL — ELIQUIS 5 MG TABLET: 5 | 90 days supply | Qty: 180 | Fill #4

## 2015-09-06 MED FILL — traZODone HCL 50 MG TABS: 50 | 30 days supply | Qty: 30 | Fill #1

## 2015-09-13 DIAGNOSIS — L309 Dermatitis, unspecified: Secondary | ICD-10-CM | POA: Diagnosis not present

## 2015-09-13 MED FILL — CLOBETASOL 0.05% CREAM: 0.05 | 14 days supply | Qty: 60 | Fill #0

## 2015-09-22 ENCOUNTER — Telehealth: Payer: Self-pay | Admitting: Internal Medicine

## 2015-09-22 NOTE — Telephone Encounter (Signed)
She wants to know if she can stop her Eliquis until Tuesday? She is going to have fillers in her chin. Please call to advise

## 2015-09-22 NOTE — Telephone Encounter (Signed)
Returned patient's call. Pt on Coumadin for afib with Chads2vasc score of 2, no stroke hx. Ok to hold Eliquis if need be for 24 hours prior to chin fillers.

## 2015-09-29 DIAGNOSIS — Z7901 Long term (current) use of anticoagulants: Secondary | ICD-10-CM | POA: Diagnosis not present

## 2015-09-29 DIAGNOSIS — I4891 Unspecified atrial fibrillation: Secondary | ICD-10-CM | POA: Diagnosis not present

## 2015-09-29 DIAGNOSIS — Z1211 Encounter for screening for malignant neoplasm of colon: Secondary | ICD-10-CM | POA: Diagnosis not present

## 2015-10-05 MED FILL — traZODone HCL 50 MG TABS: 50 | 30 days supply | Qty: 30 | Fill #2

## 2015-10-19 DIAGNOSIS — R634 Abnormal weight loss: Secondary | ICD-10-CM | POA: Diagnosis not present

## 2015-10-19 DIAGNOSIS — E039 Hypothyroidism, unspecified: Secondary | ICD-10-CM | POA: Diagnosis not present

## 2015-10-20 DIAGNOSIS — R634 Abnormal weight loss: Secondary | ICD-10-CM | POA: Diagnosis not present

## 2015-10-21 ENCOUNTER — Other Ambulatory Visit: Payer: Self-pay | Admitting: Internal Medicine

## 2015-10-21 ENCOUNTER — Ambulatory Visit
Admission: RE | Admit: 2015-10-21 | Discharge: 2015-10-21 | Disposition: A | Discharge status: Home / Self Care | Payer: PPO | Source: Ambulatory Visit | Attending: Internal Medicine | Admitting: Internal Medicine

## 2015-10-21 DIAGNOSIS — R634 Abnormal weight loss: Secondary | ICD-10-CM

## 2015-10-21 MED FILL — SYNTHROID 50 MCG TABLET: 50 | 90 days supply | Qty: 90 | Fill #0 | Status: TO

## 2015-10-26 ENCOUNTER — Other Ambulatory Visit: Payer: Self-pay

## 2015-10-26 DIAGNOSIS — Z1231 Encounter for screening mammogram for malignant neoplasm of breast: Secondary | ICD-10-CM

## 2015-10-27 DIAGNOSIS — R634 Abnormal weight loss: Secondary | ICD-10-CM | POA: Diagnosis not present

## 2015-11-03 MED FILL — traZODone HCL 50 MG TABS: 50 | 30 days supply | Qty: 30 | Fill #3

## 2015-11-08 MED FILL — MOVIPREP POWDER KIT: 100 | 1 days supply | Qty: 1 | Fill #0

## 2015-11-10 DIAGNOSIS — Z1211 Encounter for screening for malignant neoplasm of colon: Secondary | ICD-10-CM | POA: Diagnosis not present

## 2015-11-18 MED FILL — ELIQUIS 5 MG TABLET: 5 | 90 days supply | Qty: 180 | Fill #5

## 2015-11-30 DIAGNOSIS — Z471 Aftercare following joint replacement surgery: Secondary | ICD-10-CM | POA: Diagnosis not present

## 2015-11-30 DIAGNOSIS — Z96612 Presence of left artificial shoulder joint: Secondary | ICD-10-CM | POA: Diagnosis not present

## 2015-12-01 ENCOUNTER — Encounter: Payer: Self-pay | Admitting: Cardiology

## 2015-12-06 ENCOUNTER — Ambulatory Visit
Admission: RE | Admit: 2015-12-06 | Discharge: 2015-12-06 | Disposition: A | Discharge status: Home / Self Care | Payer: PPO | Source: Ambulatory Visit

## 2015-12-06 ENCOUNTER — Ambulatory Visit
Admission: RE | Admit: 2015-12-06 | Discharge: 2015-12-06 | Disposition: A | Discharge status: Home / Self Care | Payer: PPO | Source: Ambulatory Visit | Attending: Obstetrics and Gynecology | Admitting: Obstetrics and Gynecology

## 2015-12-06 ENCOUNTER — Other Ambulatory Visit: Payer: Self-pay

## 2015-12-06 ENCOUNTER — Ambulatory Visit: Payer: PPO

## 2015-12-06 DIAGNOSIS — Z1231 Encounter for screening mammogram for malignant neoplasm of breast: Secondary | ICD-10-CM

## 2015-12-06 DIAGNOSIS — M810 Age-related osteoporosis without current pathological fracture: Secondary | ICD-10-CM

## 2015-12-06 DIAGNOSIS — Z780 Asymptomatic menopausal state: Secondary | ICD-10-CM | POA: Diagnosis not present

## 2015-12-06 DIAGNOSIS — M8580 Other specified disorders of bone density and structure, unspecified site: Secondary | ICD-10-CM

## 2015-12-06 HISTORY — DX: Age-related osteoporosis without current pathological fracture: M81.0

## 2015-12-06 MED FILL — traZODone HCL 50 MG TABS: 50 | 30 days supply | Qty: 30 | Fill #0

## 2015-12-10 MED FILL — SERTRALINE HCL 100 MG TAB: 100 | 90 days supply | Qty: 135 | Fill #0

## 2015-12-10 MED FILL — FLECAINIDE ACETATE 100 MG T: 100 | 90 days supply | Qty: 180 | Fill #2

## 2015-12-22 ENCOUNTER — Telehealth: Payer: Self-pay | Admitting: Obstetrics and Gynecology

## 2015-12-22 NOTE — Telephone Encounter (Signed)
Patient Result Comments     Entered by Nunzio Cobbs, MD at 12/07/2015 5:43 PM    Hello Ms. Rolph,   I am sharing the results of your bone density study which is showing osteoporosis of the hip. The spine is on the border between normal and osteopenia.   Please call for an appointment to discuss osteoporosis. We will make a plan for your care. You are at risk for fracture, and we want to minimize this possibility!   Thank you,   Josefa Half, MD    Spoke with patient. Advised of results and message as seen above from [place]. She is agreeable and verbalizes understanding. Appointment scheduled for 12/23/2015 at 12:45 pm with Dr.Silva. She is agreeable to date and time.  Routing to provider for final review. Patient agreeable to disposition. Will close encounter.

## 2015-12-23 ENCOUNTER — Ambulatory Visit (INDEPENDENT_AMBULATORY_CARE_PROVIDER_SITE_OTHER): Payer: PPO | Admitting: Obstetrics and Gynecology

## 2015-12-23 ENCOUNTER — Encounter: Payer: Self-pay | Admitting: Obstetrics and Gynecology

## 2015-12-23 VITALS — BP 98/60 | HR 66 | Ht 63.5 in | Wt 104.6 lb

## 2015-12-23 DIAGNOSIS — Z113 Encounter for screening for infections with a predominantly sexual mode of transmission: Secondary | ICD-10-CM

## 2015-12-23 DIAGNOSIS — M810 Age-related osteoporosis without current pathological fracture: Secondary | ICD-10-CM

## 2015-12-23 LAB — BASIC METABOLIC PANEL
BUN: 20 mg/dL (ref 7–25)
CHLORIDE: 99 mmol/L (ref 98–110)
CO2: 27 mmol/L (ref 20–31)
CREATININE: 0.66 mg/dL (ref 0.50–0.99)
Calcium: 9 mg/dL (ref 8.6–10.4)
Glucose, Bld: 111 mg/dL — ABNORMAL HIGH (ref 65–99)
Potassium: 4 mmol/L (ref 3.5–5.3)
Sodium: 137 mmol/L (ref 135–146)

## 2015-12-23 MED ORDER — RISEDRONATE SODIUM 150 MG PO TABS: 150.0000 mg | ORAL_TABLET | ORAL | Status: DC

## 2015-12-23 NOTE — Progress Notes (Signed)
Patient ID: Terri [name], female   DOB: 1950-11-04, 65 y.o.   MRN: [id number] GYNECOLOGY  VISIT   HPI: 65 y.o.   Married  Caucasian  female   G2P2002 with No LMP recorded. Patient has had a hysterectomy.   here for consultation regarding bone density results.   No known vit D deficiency.  No recent testing. Taking calcium and vit D supplement once daily and a MVI once daily.   Not a smoker.  No steroid use.  ETOH:  Glass of wine a day on weekends.  No malabsorption syndrome.  Loosing weight.  Thyroid is in balance.   Does weight bearing exercise.  Not doing as much exercise due to shoulder surgery in September 2016.  Desires Hep C testing.   GYNECOLOGIC HISTORY: No LMP recorded. Patient has had a hysterectomy. Contraception:  Hysterectomy Menopausal hormone therapy: none Last mammogram:  12-04-14 Implants, 3D Density Cat.B/Neg/BiRads 1:[facility]. Last pap smear:   2002 Neg        OB History    Gravida Para Term Preterm AB TAB SAB Ectopic Multiple Living   2 2 2       2          Patient Active Problem List   Diagnosis Date Noted  . 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 Preoperative cardiovascular examination 03/18/2015  . Atrial fibrillation ([place]) 12/23/2013  . Atrial flutter ([place]) 11/05/2013  . Paroxysmal atrial fibrillation ([place]) 08/28/2013  . Palpitations 08/28/2013  . Dizziness 08/28/2013    Past Medical History  Diagnosis Date  . Depression   . Hypothyroidism   . Dyspareunia   . Fibroid   . Arthritis     lt shoulder  . Paroxysmal atrial fibrillation (HCC)   . Atrial flutter ([place])   . Dysrhythmia     a fib  . Heart murmur   . Osteoporosis May, 2017    Past Surgical History  Procedure Laterality Date  . Abdominal hysterectomy    . Breast enhancement surgery  83,11  . Fracture surgery Right     wrist  . Cosmetic surgery  2003    face  . Augmentation mammaplasty    . Flexible sigmoidoscopy N/A 07/29/2013    Procedure: FLEXIBLE  SIGMOIDOSCOPY;  Surgeon: [name], MD;  Location: [facility] ENDOSCOPY;  Service: Endoscopy;  Laterality: N/A;  . Tee without cardioversion N/A 12/22/2013    Procedure: TRANSESOPHAGEAL ECHOCARDIOGRAM (TEE);  Surgeon: [name], MD;  Location: [facility] ENDOSCOPY;  Service: Cardiovascular;  Laterality: N/A;  . Ablation  12-23-2013    PVI and CTI by Dr Rayann Heman  . Atrial fibrillation ablation N/A 12/23/2013    Procedure: ATRIAL FIBRILLATION ABLATION;  Surgeon: Coralyn Mark, MD;  Location: [place] CATH LAB;  Service: Cardiovascular;  Laterality: N/A;  . Tonsillectomy    . Total shoulder arthroplasty Left 04/23/2015    Procedure: LEFT TOTAL SHOULDER ARTHROPLASTY;  Surgeon: Netta Cedars, MD;  Location: [place];  Service: Orthopedics;  Laterality: Left;    Current Outpatient Prescriptions  Medication Sig Dispense Refill  . acetaminophen (TYLENOL) 325 MG tablet Take 650 mg by mouth every 6 (six) hours as needed for mild pain or moderate pain.    Marland Kitchen apixaban (ELIQUIS) 5 MG TABS tablet Take 1 tablet (5 mg total) by mouth 2 (two) times daily. 180 tablet 3  . Biotin 5000 MCG CAPS Take 1 capsule by mouth daily.    . Calcium Carb-Cholecalciferol (CALCIUM-VITAMIN D3) 500-400 MG-UNIT TABS Take 1 tablet by mouth  daily.    . flecainide (TAMBOCOR) 50 MG tablet Take 1 tablet (50 mg total) by mouth 2 (two) times daily. 180 tablet 3  . levothyroxine (SYNTHROID, LEVOTHROID) 50 MCG tablet Take 50 mcg by mouth daily.    . Multiple Vitamin (MULTIVITAMIN) capsule Take 1 capsule by mouth daily.    . sertraline (ZOLOFT) 100 MG tablet Take 150 mg by mouth daily.    . traZODone (DESYREL) 50 MG tablet Take 25-50 mg by mouth at bedtime.     . tretinoin (RETIN-A) 0.05 % cream Apply topically at bedtime. (Patient taking differently: Apply 1 application topically 2 (two) times a week. AT BEDTIME) 45 g 2   No current facility-administered medications for this visit.     ALLERGIES: Review of patient's allergies indicates no known  allergies.  Family History  Problem Relation Age of Onset  . Cancer Mother   . Hypertension Mother   . Thyroid disease Mother   . Cancer Paternal Grandmother   . Breast cancer Paternal Grandmother   . Thyroid disease Sister   . Stroke Maternal Grandmother     Social History   Social History  . Marital Status: Married    Spouse Name: N/A  . Number of Children: N/A  . Years of Education: N/A   Occupational History  . Not on file.   Social History Main Topics  . Smoking status: Never Smoker   . Smokeless tobacco: Never Used  . Alcohol Use: 3.0 oz/week    5 Glasses of wine per week     Comment: 5 glasses of wine per week  . Drug Use: No  . Sexual Activity:    Partners: Male    Birth Control/ Protection: None, Surgical     Comment: TVH--still has ovaries   Other Topics Concern  . Not on file   Social History Narrative   Pt lives in [place] with spouse.  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    ROS:  Pertinent items are noted in HPI.  PHYSICAL EXAMINATION:    BP 98/60 mmHg  Pulse 66  Ht 5' 3.5" (1.613 m)  Wt 104 lb 9.6 oz (47.446 kg)  BMI 18.24 kg/m2    General appearance: alert, cooperative and appears stated age.   EXAM: DUAL X-RAY ABSORPTIOMETRY (DXA) FOR BONE MINERAL DENSITY  IMPRESSION: Referring Physician: Josefa Half  PATIENT: Name: Terri [name], Terri [name] Patient ID: [id number] Birth Date: 08/23/1950 Height: 63.0 in. Sex: Female Measured: 12/06/2015 Weight: 106.0 lbs. Indications: Caucasian, Estrogen Deficient, History of Osteopenia, Hysterectomy, Postmenopausal, Synthroid Fractures: Wrist Treatments: Vitamin D (E933.5)  ASSESSMENT: The BMD measured at Femur Neck Right is 0.692 g/cm2 with a T-score of -2.5. This patient is considered osteoporotic according to [facility]) criteria. There has been a statistically significant change in BMD of Lumbar spine since prior exam dated 06/10/2013.  Site Region Measured Date  Measured Age YA BMD Significant CHANGE T-score DualFemur Neck Right 12/06/2015 67.2 -2.5 0.692 g/cm2  AP Spine L1-L4 12/06/2015 67.2 -0.7 1.086 g/cm2 *  World Health Organization [facility]) criteria for post-menopausal, Caucasian Women: Normal T-score at or above -1 SD Osteopenia T-score between -1 and -2.5 SD Osteoporosis T-score at or below -2.5 SD  RECOMMENDATION: [facility] recommends that FDA-approved medical therapies be considered in postmenopausal women and men age 38 or older with a:  1. Hip or vertebral (clinical or morphometric) fracture. 2. T-score of <-2.5 at the spine or hip. 3. Ten-year fracture probability by FRAX of 3% or greater  for hip fracture or 20% or greater for major osteoporotic fracture.  All treatment decisions require clinical judgment and consideration of individual patient factors, including patient preferences, co-morbidities, previous drug use, risk factors not captured in the FRAX model (e.g. falls, vitamin D deficiency, increased bone turnover, interval significant decline in bone density) and possible under - or over-estimation of fracture risk by FRAX.  All patients should ensure an adequate intake of dietary calcium (1200 mg/d) and vitamin D (800 IU daily) unless contraindicated. FOLLOW-UP: People with diagnosed cases of osteoporosis or at high risk for fracture should have regular bone mineral density tests. For patients eligible for Medicare, routine testing is allowed once every 2 years. The testing frequency can be increased to one year for patients who have rapidly progressing disease, those who are receiving or discontinuing medical therapy to restore bone mass, or have additional risk factors.  I have reviewed this report, and agree with the above findings.  [facility]   Electronically Signed  By: Lajean Manes M.D.  On: 12/06/2015  12:20  ASSESSMENT  Osteoporosis.  Desire for Hep C testing.   PLAN  Check Vit D, BMP, and Hep C aby.  Discussion of Osteoporosis - risk factors, implications, and tx options.   Will proceed with Actonel 150 mg monthly after labs back.  Patient instructed in use, side effects.  Continue weight bearing exercise, calcium, and vitamin D. Discussed reducing fall risk. Next BMD in 2 years.  Follow up in 3 months for a recheck or sooner if needed.    An After Visit Summary was printed and given to the patient.  __25____ minutes face to face time of which over 50% was spent in counseling.

## 2015-12-23 NOTE — Patient Instructions (Signed)
Risedronate tablets What is this medicine? RISEDRONATE (ris ED roe nate) reduces calcium loss from bones. It helps make healthy bone and to slow bone loss in patients with Paget's disease and osteoporosis. It may be used in others at risk for bone loss. This medicine may be used for other purposes; ask your health care provider or pharmacist if you have questions. What should I tell my health care provider before I take this medicine? They need to know if you have any of these conditions: -dental disease -esophagus, stomach, or intestine problems, like acid reflux or GERD -kidney disease -low blood calcium -problems sitting or standing for 30 minutes -trouble swallowing -an unusual or allergic reaction to risedronate, other medicines, foods, dyes, or preservatives -pregnant or trying to get pregnant -breast-feeding How should I use this medicine? You must take this medication exactly as directed or you will lower the amount of medicine you absorb into your body or you may cause your self harm. Take this medicine by mouth first thing in the morning, after you are up for the day. Do not eat or drink anything before you take this medicine. Swallow the tablets with a full glass (6 to 8 fluid ounces) of plain water. Do not take the tablets with any other drink. Do not chew or crush the tablet. After taking this medicine, do not eat breakfast, drink, or take any other medicines or vitamins for at least 30 minutes. Stand or sit up for at least 30 minutes after you take this medicine; do not lie down. Do not take your medicine more often than directed. Talk to your pediatrician regarding the use of this medicine in children. Special care may be needed. Overdosage: If you think you have taken too much of this medicine contact a poison control center or emergency room at once. NOTE: This medicine is only for you. Do not share this medicine with others. What if I miss a dose? If you miss a dose, do not  take it later in the day. Take your normal dose the next morning. Do not take double or extra doses. What may interact with this medicine? -antacids like aluminum hydroxide or magnesium hydroxide -aspirin -calcium supplements -iron supplements -NSAIDs, medicines for pain and inflammation, like ibuprofen or naproxen -thyroid hormones -vitamins with minerals This list may not describe all possible interactions. Give your health care provider a list of all the medicines, herbs, non-prescription drugs, or dietary supplements you use. Also tell them if you smoke, drink alcohol, or use illegal drugs. Some items may interact with your medicine. What should I watch for while using this medicine? Visit your doctor or health care professional for regular check ups. It may be some time before you see the benefit from this medicine. Your doctor or health care professional may order blood tests and other tests to see how you are doing. You should make sure you get enough calcium and vitamin D while you are taking this medicine, unless your doctor tells you not to. Discuss the foods you eat and the vitamins you take with your health care professional. Some people who take this medicine have severe bone, joint, and/or muscle pain. This medicine may also increase your risk for a broken thigh bone. Tell your doctor right away if you have pain in your upper leg or groin. Tell your doctor if you have any pain that does not go away or that gets worse. What side effects may I notice from receiving this medicine? Side effects  that you should report to your doctor as soon as possible: -allergic reactions such as skin rash or itching, hives, swelling of the face, lips, throat, or tongue -black or tarry stools -changes in vision -heartburn or stomach pain -jaw pain, especially after dental work -pain or difficulty when swallowing -redness, blistering, peeling, or loosening of the skin, including inside the mouth Side  effects that usually do not require medical attention (report to your doctor if they continue or are bothersome): -bone, muscle, or joint pain -changes in taste -diarrhea or constipation -eye pain or itching -headache -nausea or vomiting -stomach gas or fullness This list may not describe all possible side effects. Call your doctor for medical advice about side effects. You may report side effects to FDA at 1-800-FDA-1088. Where should I keep my medicine? Keep out of the reach of children. Store at room temperature between 20 and 25 degrees C (68 and 77 degrees F). Throw away any unused medicine after the expiration date. NOTE: This sheet is a summary. It may not cover all possible information. If you have questions about this medicine, talk to your doctor, pharmacist, or health care provider.    2016, [employer]. (2012-06-14 16:21:37)   EXERCISE AND DIET:  We recommended that you start or continue a regular exercise program for good health. Regular exercise means any activity that makes your heart beat faster and makes you sweat.  We recommend exercising at least 30 minutes per day at least 3 days a week, preferably 4 or 5.  We also recommend a diet low in fat and sugar.  Inactivity, poor dietary choices and obesity can cause diabetes, heart attack, stroke, and kidney damage, among others.    ALCOHOL AND SMOKING:  Women should limit their alcohol intake to no more than 7 drinks/beers/glasses of wine (combined, not each!) per week. Moderation of alcohol intake to this level decreases your risk of breast cancer and liver damage. And of course, no recreational drugs are part of a healthy lifestyle.  And absolutely no smoking or even second hand smoke. Most people know smoking can cause heart and lung diseases, but did you know it also contributes to weakening of your bones? Aging of your skin?  Yellowing of your teeth and nails?  CALCIUM AND VITAMIN D:  Adequate intake of calcium and  Vitamin D are recommended.  The recommendations for exact amounts of these supplements seem to change often, but generally speaking 600 mg of calcium (either carbonate or citrate) and 800 units of Vitamin D per day seems prudent. Certain women may benefit from higher intake of Vitamin D.  If you are among these women, your doctor will have told you during your visit.    PAP SMEARS:  Pap smears, to check for cervical cancer or precancers,  have traditionally been done yearly, although recent scientific advances have shown that most women can have pap smears less often.  However, every woman still should have a physical exam from her gynecologist every year. It will include a breast check, inspection of the vulva and vagina to check for abnormal growths or skin changes, a visual exam of the cervix, and then an exam to evaluate the size and shape of the uterus and ovaries.  And after 67 years of age, a rectal exam is indicated to check for rectal cancers. We will also provide age appropriate advice regarding health maintenance, like when you should have certain vaccines, screening for sexually transmitted diseases, bone density testing, colonoscopy, mammograms, etc.  MAMMOGRAMS:  All women over 20 years old should have a yearly mammogram. Many facilities now offer a "3D" mammogram, which may cost around $50 extra out of pocket. If possible,  we recommend you accept the option to have the 3D mammogram performed.  It both reduces the number of women who will be called back for extra views which then turn out to be normal, and it is better than the routine mammogram at detecting truly abnormal areas.    COLONOSCOPY:  Colonoscopy to screen for colon cancer is recommended for all women at age 76.  We know, you hate the idea of the prep.  We agree, BUT, having colon cancer and not knowing it is worse!!  Colon cancer so often starts as a polyp that can be seen and removed at colonscopy, which can quite literally save  your life!  And if your first colonoscopy is normal and you have no family history of colon cancer, most women don't have to have it again for 10 years.  Once every ten years, you can do something that may end up saving your life, right?  We will be happy to help you get it scheduled when you are ready.  Be sure to check your insurance coverage so you understand how much it will cost.  It may be covered as a preventative service at no cost, but you should check your particular policy.

## 2015-12-24 LAB — HEPATITIS C ANTIBODY: HCV Ab: NEGATIVE

## 2015-12-24 LAB — VITAMIN D 25 HYDROXY (VIT D DEFICIENCY, FRACTURES): Vit D, 25-Hydroxy: 75 ng/mL (ref 30–100)

## 2015-12-27 MED FILL — RISEDRONATE NA 150 MG TAB: 150 | 30 days supply | Qty: 1 | Fill #0

## 2016-01-11 MED FILL — traZODone HCL 50 MG TABS: 50 | 30 days supply | Qty: 30 | Fill #1

## 2016-01-17 ENCOUNTER — Encounter: Payer: Self-pay | Admitting: Obstetrics and Gynecology

## 2016-01-17 ENCOUNTER — Telehealth: Payer: Self-pay | Admitting: *Deleted

## 2016-01-17 DIAGNOSIS — K589 Irritable bowel syndrome without diarrhea: Secondary | ICD-10-CM | POA: Diagnosis not present

## 2016-01-17 DIAGNOSIS — R634 Abnormal weight loss: Secondary | ICD-10-CM | POA: Diagnosis not present

## 2016-01-17 DIAGNOSIS — M810 Age-related osteoporosis without current pathological fracture: Secondary | ICD-10-CM | POA: Diagnosis not present

## 2016-01-17 DIAGNOSIS — E039 Hypothyroidism, unspecified: Secondary | ICD-10-CM | POA: Diagnosis not present

## 2016-01-17 DIAGNOSIS — L659 Nonscarring hair loss, unspecified: Secondary | ICD-10-CM | POA: Diagnosis not present

## 2016-01-17 DIAGNOSIS — I4891 Unspecified atrial fibrillation: Secondary | ICD-10-CM | POA: Diagnosis not present

## 2016-01-17 DIAGNOSIS — Z7901 Long term (current) use of anticoagulants: Secondary | ICD-10-CM | POA: Diagnosis not present

## 2016-01-17 DIAGNOSIS — Z0001 Encounter for general adult medical examination with abnormal findings: Secondary | ICD-10-CM | POA: Diagnosis not present

## 2016-01-17 DIAGNOSIS — F325 Major depressive disorder, single episode, in full remission: Secondary | ICD-10-CM | POA: Diagnosis not present

## 2016-01-17 DIAGNOSIS — G4730 Sleep apnea, unspecified: Secondary | ICD-10-CM | POA: Diagnosis not present

## 2016-01-17 NOTE — Telephone Encounter (Signed)
Last annual 05-12-15, last OV 12-28-15 with Dr Quincy Simmonds.  Call to patient: states she has significant discomfort since starting on Actonel. "It is really hurting" Cant take Motrin due to A fib. Tylenol has not helped at all. Had appointment with PCP today who said since she was just had the border of osteoporosis, perhaps she could try increased calcium, Vit D and weight bearing exercise as opposed to medications. PCP said this was a call her would leave to [place] to make. Patient also feels this is Dr Elza Rafter decision.  Advised patient Dr Quincy Simmonds wants to provide her the options and information and allow her to make the decision. Discussed would have to actually change the calcium, Vit D and exercise from what she has been doing enough to prevent bone loss. Only she can decide if how she feels on medication is worth the risk of being off medication. Offered office visit with Dr Quincy Simmonds to discuss these concerns further. Patient declines. Really would like to just ask Dr Elza Rafter opinion on being off meds, versus switching to Fosamax. Next dose due 01-28-16.  Please advise.

## 2016-01-17 NOTE — Telephone Encounter (Signed)
I would recommend stopping the Actonel.  Do weight bearing exercise.  Continue calcium and vitamin D.  Recheck BMD in 2 years.  Reassess at that time for alternatives such as Prolia.   The Fosamax is likely to result in the same side effects.

## 2016-01-17 NOTE — Telephone Encounter (Signed)
My Chart message from patient:  Terri [name] on Dec 28, 2015. I am experiencing significant bone/joint pain in my knees, elbows and ankles. I have no idea if this is related, but my face has been breaking out. I would love to discontinue the Actonel and just regularly take calcium and continue my weight training and cardio. If this is not an option, would it be possible to switch to weekly Fosamax instead of the Actonel? I realize that Fosamax may have the same side effects, but with my insurance plan it is by far less expensive. If I am going to hurt with both, I would prefer to hurt not so much in my pocket.       Thank you for your consideration of this matter. I look forward to your response and will certainly do as you instruct.      Terri [name]   [birth year]

## 2016-01-17 NOTE — Telephone Encounter (Signed)
Call back to patient. Advised of instructions from Dr Quincy Simmonds. Patient agreeable to plan. Encounter closed.

## 2016-01-19 MED FILL — SYNTHROID 50 MCG TABLET: 50 | 87 days supply | Qty: 87 | Fill #1 | Status: TO

## 2016-01-29 DIAGNOSIS — L237 Allergic contact dermatitis due to plants, except food: Secondary | ICD-10-CM | POA: Diagnosis not present

## 2016-02-10 MED FILL — traZODone HCL 50 MG TABS: 50 | 30 days supply | Qty: 30 | Fill #2

## 2016-02-18 ENCOUNTER — Other Ambulatory Visit: Payer: Self-pay | Admitting: Cardiology

## 2016-02-18 MED FILL — ELIQUIS 5 MG TABLET: 5 | 90 days supply | Qty: 180 | Fill #0

## 2016-03-08 DIAGNOSIS — D1801 Hemangioma of skin and subcutaneous tissue: Secondary | ICD-10-CM | POA: Diagnosis not present

## 2016-03-08 DIAGNOSIS — L821 Other seborrheic keratosis: Secondary | ICD-10-CM | POA: Diagnosis not present

## 2016-03-08 DIAGNOSIS — L570 Actinic keratosis: Secondary | ICD-10-CM | POA: Diagnosis not present

## 2016-03-08 DIAGNOSIS — L820 Inflamed seborrheic keratosis: Secondary | ICD-10-CM | POA: Diagnosis not present

## 2016-03-08 DIAGNOSIS — D692 Other nonthrombocytopenic purpura: Secondary | ICD-10-CM | POA: Diagnosis not present

## 2016-03-08 DIAGNOSIS — L814 Other melanin hyperpigmentation: Secondary | ICD-10-CM | POA: Diagnosis not present

## 2016-03-23 ENCOUNTER — Ambulatory Visit: Payer: PPO | Admitting: Obstetrics and Gynecology

## 2016-04-17 MED FILL — traZODone HCL 50 MG TABS: 50 | 30 days supply | Qty: 30 | Fill #0

## 2016-04-17 MED FILL — SYNTHROID 50 MCG TABLET: 50 | 90 days supply | Qty: 90 | Fill #0 | Status: TO

## 2016-05-15 ENCOUNTER — Encounter: Payer: Self-pay | Admitting: Internal Medicine

## 2016-05-17 ENCOUNTER — Ambulatory Visit (INDEPENDENT_AMBULATORY_CARE_PROVIDER_SITE_OTHER): Payer: PPO | Admitting: Obstetrics and Gynecology

## 2016-05-17 ENCOUNTER — Encounter: Payer: Self-pay | Admitting: Obstetrics and Gynecology

## 2016-05-17 VITALS — BP 92/60 | HR 70 | Resp 14 | Ht 62.75 in | Wt 108.0 lb

## 2016-05-17 DIAGNOSIS — Z0000 Encounter for general adult medical examination without abnormal findings: Secondary | ICD-10-CM

## 2016-05-17 DIAGNOSIS — R319 Hematuria, unspecified: Secondary | ICD-10-CM | POA: Diagnosis not present

## 2016-05-17 DIAGNOSIS — Z01419 Encounter for gynecological examination (general) (routine) without abnormal findings: Secondary | ICD-10-CM | POA: Diagnosis not present

## 2016-05-17 LAB — POCT URINALYSIS DIPSTICK
Bilirubin, UA: NEGATIVE
Glucose, UA: NEGATIVE
KETONES UA: NEGATIVE
LEUKOCYTES UA: NEGATIVE
NITRITE UA: NEGATIVE
PH UA: 5
PROTEIN UA: NEGATIVE
UROBILINOGEN UA: NEGATIVE

## 2016-05-17 NOTE — Patient Instructions (Signed)
EXERCISE AND DIET:  We recommended that you start or continue a regular exercise program for good health. Regular exercise means any activity that makes your heart beat faster and makes you sweat.  We recommend exercising at least 30 minutes per day at least 3 days a week, preferably 4 or 5.  We also recommend a diet low in fat and sugar.  Inactivity, poor dietary choices and obesity can cause diabetes, heart attack, stroke, and kidney damage, among others.    ALCOHOL AND SMOKING:  Women should limit their alcohol intake to no more than 7 drinks/beers/glasses of wine (combined, not each!) per week. Moderation of alcohol intake to this level decreases your risk of breast cancer and liver damage. And of course, no recreational drugs are part of a healthy lifestyle.  And absolutely no smoking or even second hand smoke. Most people know smoking can cause heart and lung diseases, but did you know it also contributes to weakening of your bones? Aging of your skin?  Yellowing of your teeth and nails?  CALCIUM AND VITAMIN D:  Adequate intake of calcium and Vitamin D are recommended.  The recommendations for exact amounts of these supplements seem to change often, but generally speaking 600 mg of calcium (either carbonate or citrate) and 800 units of Vitamin D per day seems prudent. Certain women may benefit from higher intake of Vitamin D.  If you are among these women, your doctor will have told you during your visit.    PAP SMEARS:  Pap smears, to check for cervical cancer or precancers,  have traditionally been done yearly, although recent scientific advances have shown that most women can have pap smears less often.  However, every woman still should have a physical exam from her gynecologist every year. It will include a breast check, inspection of the vulva and vagina to check for abnormal growths or skin changes, a visual exam of the cervix, and then an exam to evaluate the size and shape of the uterus and  ovaries.  And after 67 years of age, a rectal exam is indicated to check for rectal cancers. We will also provide age appropriate advice regarding health maintenance, like when you should have certain vaccines, screening for sexually transmitted diseases, bone density testing, colonoscopy, mammograms, etc.   MAMMOGRAMS:  All women over 40 years old should have a yearly mammogram. Many facilities now offer a "3D" mammogram, which may cost around $50 extra out of pocket. If possible,  we recommend you accept the option to have the 3D mammogram performed.  It both reduces the number of women who will be called back for extra views which then turn out to be normal, and it is better than the routine mammogram at detecting truly abnormal areas.    COLONOSCOPY:  Colonoscopy to screen for colon cancer is recommended for all women at age 50.  We know, you hate the idea of the prep.  We agree, BUT, having colon cancer and not knowing it is worse!!  Colon cancer so often starts as a polyp that can be seen and removed at colonscopy, which can quite literally save your life!  And if your first colonoscopy is normal and you have no family history of colon cancer, most women don't have to have it again for 10 years.  Once every ten years, you can do something that may end up saving your life, right?  We will be happy to help you get it scheduled when you are ready.    Be sure to check your insurance coverage so you understand how much it will cost.  It may be covered as a preventative service at no cost, but you should check your particular policy.    Denosumab injection What is this medicine? DENOSUMAB (den oh sue mab) slows bone breakdown. Prolia is used to treat osteoporosis in women after menopause and in men. Delton See is used to prevent bone fractures and other bone problems caused by cancer bone metastases. Delton See is also used to treat giant cell tumor of the bone. This medicine may be used for other purposes; ask your  health care provider or pharmacist if you have questions. What should I tell my health care provider before I take this medicine? They need to know if you have any of these conditions: -dental disease -eczema -infection or history of infections -kidney disease or on dialysis -low blood calcium or vitamin D -malabsorption syndrome -scheduled to have surgery or tooth extraction -taking medicine that contains denosumab -thyroid or parathyroid disease -an unusual reaction to denosumab, other medicines, foods, dyes, or preservatives -pregnant or trying to get pregnant -breast-feeding How should I use this medicine? This medicine is for injection under the skin. It is given by a health care professional in a hospital or clinic setting. If you are getting Prolia, a special MedGuide will be given to you by the pharmacist with each prescription and refill. Be sure to read this information carefully each time. For Prolia, talk to your pediatrician regarding the use of this medicine in children. Special care may be needed. For Delton See, talk to your pediatrician regarding the use of this medicine in children. While this drug may be prescribed for children as young as 13 years for selected conditions, precautions do apply. Overdosage: If you think you have taken too much of this medicine contact a poison control center or emergency room at once. NOTE: This medicine is only for you. Do not share this medicine with others. What if I miss a dose? It is important not to miss your dose. Call your doctor or health care professional if you are unable to keep an appointment. What may interact with this medicine? Do not take this medicine with any of the following medications: -other medicines containing denosumab This medicine may also interact with the following medications: -medicines that suppress the immune system -medicines that treat cancer -steroid medicines like prednisone or cortisone This list may  not describe all possible interactions. Give your health care provider a list of all the medicines, herbs, non-prescription drugs, or dietary supplements you use. Also tell them if you smoke, drink alcohol, or use illegal drugs. Some items may interact with your medicine. What should I watch for while using this medicine? Visit your doctor or health care professional for regular checks on your progress. Your doctor or health care professional may order blood tests and other tests to see how you are doing. Call your doctor or health care professional if you get a cold or other infection while receiving this medicine. Do not treat yourself. This medicine may decrease your body's ability to fight infection. You should make sure you get enough calcium and vitamin D while you are taking this medicine, unless your doctor tells you not to. Discuss the foods you eat and the vitamins you take with your health care professional. See your dentist regularly. Brush and floss your teeth as directed. Before you have any dental work done, tell your dentist you are receiving this medicine. Do not  become pregnant while taking this medicine or for 5 months after stopping it. Women should inform their doctor if they wish to become pregnant or think they might be pregnant. There is a potential for serious side effects to an unborn child. Talk to your health care professional or pharmacist for more information. What side effects may I notice from receiving this medicine? Side effects that you should report to your doctor or health care professional as soon as possible: -allergic reactions like skin rash, itching or hives, swelling of the face, lips, or tongue -breathing problems -chest pain -fast, irregular heartbeat -feeling faint or lightheaded, falls -fever, chills, or any other sign of infection -muscle spasms, tightening, or twitches -numbness or tingling -skin blisters or bumps, or is dry, peels, or red -slow  healing or unexplained pain in the mouth or jaw -unusual bleeding or bruising Side effects that usually do not require medical attention (Report these to your doctor or health care professional if they continue or are bothersome.): -muscle pain -stomach upset, gas This list may not describe all possible side effects. Call your doctor for medical advice about side effects. You may report side effects to FDA at 1-800-FDA-1088. Where should I keep my medicine? This medicine is only given in a clinic, doctor's office, or other health care setting and will not be stored at home. NOTE: This sheet is a summary. It may not cover all possible information. If you have questions about this medicine, talk to your doctor, pharmacist, or health care provider.    2016, [employer] Standard. (2012-01-22 12:37:47)

## 2016-05-17 NOTE — Progress Notes (Signed)
65 y.o. G57P2002 Married Caucasian female here for annual exam.    Stopped Actonel due to bone pain.  Elbows are sore to the touch.  Her husband is a Software engineer and he thought she taking it monthly was just too much.   Large blood in urine today.  No symptoms. On Eliquis.    PCP:  Josetta Huddle   Patient's last menstrual period was 08/08/1983 (approximate).           Sexually active: No.  The current method of family planning is status post hysterectomy.  Still has ovaries.  Exercising: Yes.    Weights, cardio, pilates Smoker:  no  Health Maintenance: Pap:  2002 Neg History of abnormal Pap:  yes MMG:  12/06/15 BIRADS1:neg  Colonoscopy:  11/10/15 Normal. F/u 10 years  BMD:   12/06/15  Result  Osteoporosis of hip.  Stopped Actonel due to bone pain. TDaP:  2011 HIV: Unsure Hep C: 12/23/15 Hep C Screening Labs:PCP, Urine today: RBC=Large   reports that she has never smoked. She has never used smokeless tobacco. She reports that she drinks about 1.8 oz of alcohol per week . She reports that she does not use drugs.  Past Medical History:  Diagnosis Date  . Arthritis    lt shoulder  . Atrial flutter ([place])   . Depression   . Dyspareunia   . Dysrhythmia    a fib  . Fibroid   . Heart murmur   . Hypothyroidism   . Osteoporosis May, 2017  . Paroxysmal atrial fibrillation [facility])     Past Surgical History:  Procedure Laterality Date  . ABDOMINAL HYSTERECTOMY    . ABLATION  12-23-2013   PVI and CTI by Dr Rayann Heman  . ATRIAL FIBRILLATION ABLATION N/A 12/23/2013   Procedure: ATRIAL FIBRILLATION ABLATION;  Surgeon: Coralyn Mark, MD;  Location: [place] CATH LAB;  Service: Cardiovascular;  Laterality: N/A;  . AUGMENTATION MAMMAPLASTY    . BREAST ENHANCEMENT SURGERY  83,11  . COSMETIC SURGERY  2003   face  . FLEXIBLE SIGMOIDOSCOPY N/A 07/29/2013   Procedure: FLEXIBLE SIGMOIDOSCOPY;  Surgeon: [name], MD;  [facility] ENDOSCOPY;  Service: Endoscopy;  Laterality: N/A;  . FRACTURE  SURGERY Right    wrist  . TEE WITHOUT CARDIOVERSION N/A 12/22/2013   Procedure: TRANSESOPHAGEAL ECHOCARDIOGRAM (TEE);  Surgeon: [name], MD;  Location: [facility] ENDOSCOPY;  Service: Cardiovascular;  Laterality: N/A;  . TONSILLECTOMY    . TOTAL SHOULDER ARTHROPLASTY Left 04/23/2015   Procedure: LEFT TOTAL SHOULDER ARTHROPLASTY;  Surgeon: Netta Cedars, MD;  Location: [place];  Service: Orthopedics;  Laterality: Left;    Current Outpatient Prescriptions  Medication Sig Dispense Refill  . acetaminophen (TYLENOL) 325 MG tablet Take 650 mg by mouth every 6 (six) hours as needed for mild pain or moderate pain.    Marland Kitchen apixaban (ELIQUIS) 5 MG TABS tablet Take 1 tablet (5 mg total) by mouth 2 (two) times daily. Please call and schedule a one year follow up appointment 180 tablet 0  . Calcium Carb-Cholecalciferol (CALCIUM-VITAMIN D3) 500-400 MG-UNIT TABS Take 2 tablets by mouth daily.     . flecainide (TAMBOCOR) 50 MG tablet Take 1 tablet (50 mg total) by mouth 2 (two) times daily. 180 tablet 3  . levothyroxine (SYNTHROID, LEVOTHROID) 50 MCG tablet Take 50 mcg by mouth daily.    . Multiple Vitamin (MULTIVITAMIN) capsule Take 1 capsule by mouth daily.    . sertraline (ZOLOFT) 100 MG tablet Take 50 mg by mouth daily.     Marland Kitchen  traZODone (DESYREL) 50 MG tablet Take 25-50 mg by mouth at bedtime.      No current facility-administered medications for this visit.     Family History  Problem Relation Age of Onset  . Cancer Mother   . Hypertension Mother   . Thyroid disease Mother   . Cancer Paternal Grandmother   . Breast cancer Paternal Grandmother   . Thyroid disease Sister   . Stroke Maternal Grandmother     ROS:  Pertinent items are noted in HPI.  Otherwise, a comprehensive ROS was negative.  Exam:   BP 92/60 (BP Location: Right Arm, Patient Position: Sitting, Cuff Size: Normal)   Pulse 70   Resp 14   Ht 5' 2.75" (1.594 m)   Wt 108 lb (49 kg)   LMP 08/08/1983 (Approximate)   BMI 19.28 kg/m      General appearance: alert, cooperative and appears stated age Head: Normocephalic, without obvious abnormality, atraumatic Neck: no adenopathy, supple, symmetrical, trachea midline and thyroid normal to inspection and palpation Lungs: clear to auscultation bilaterally Breasts: normal appearance, no masses or tenderness, No nipple retraction or dimpling, No nipple discharge or bleeding, No axillary or supraclavicular adenopathy.  Bilateral implants. Heart: regular rate and rhythm Abdomen: soft, non-tender; no masses, no organomegaly Extremities: extremities normal, atraumatic, no cyanosis or edema Skin: Skin color, texture, turgor normal. No rashes or lesions Lymph nodes: Cervical, supraclavicular, and axillary nodes normal. No abnormal inguinal nodes palpated Neurologic: Grossly normal  Pelvic: External genitalia:  no lesions              Urethra:  normal appearing urethra with no masses, tenderness or lesions              Bartholins and Skenes: normal                 Vagina: normal appearing vagina with normal color and discharge, no lesions.  Atrophy noted.              Cervix:  absent              Pap taken: No. Bimanual Exam:  Uterus:   Absent.              Adnexa: no mass, fullness, tenderness              Rectal exam: Yes.  .  Confirms.              Anus:  normal sphincter tone, sebaceous cysts at 7:00 just outside anal opening.   Chaperone was present for exam.  Assessment:   Well woman visit with normal exam. Status post TAH.  Atrial fibrillation.  On Eliquis.  Microscopic hematuria. Osteoporosis. Intolerance to Actonel.   Plan: Yearly mammogram recommended after age 41.  Recommended self breast exam.  Pap and HR HPV as above. Discussed Calcium, Vitamin D, regular exercise program including cardiovascular and weight bearing exercise. Will precert Prolia. Medication discussed.  Urine micro and culture.  If moderate or large hematuria confirmed, to urology.  Follow up  annually and prn.       After visit summary provided.

## 2016-05-18 LAB — URINALYSIS, MICROSCOPIC ONLY
BACTERIA UA: NONE SEEN [HPF]
CASTS: NONE SEEN [LPF]
CRYSTALS: NONE SEEN [HPF]
Squamous Epithelial / LPF: NONE SEEN [HPF] (ref <=5)
WBC, UA: NONE SEEN WBC/HPF (ref <=5)
YEAST: NONE SEEN [HPF]

## 2016-05-18 LAB — URINE CULTURE: ORGANISM ID, BACTERIA: NO GROWTH

## 2016-05-19 ENCOUNTER — Telehealth: Payer: Self-pay

## 2016-05-19 NOTE — Telephone Encounter (Signed)
Spoke with patient. Advised patient of results as seen below from [place]. Patient is agreeable and verbalizes understanding. Patient is unable to be seen in the office today due to work schedule. She will speak with her employer regarding her schedule on Monday and return call to schedule appointment.

## 2016-05-19 NOTE — Telephone Encounter (Signed)
-----   Message from Nunzio Cobbs, MD sent at 05/19/2016  6:34 AM EDT ----- Results to patient through My Chart. Please contact patient and schedule a follow up appointment with me for today or Monday.   Hello Terri [name],   Your urine test did return showing red blood cells and nothing else.  There was no sign of infection.   I would like to see you back and perform a catheter specimen urine test to be sure that this is really coming from the bladder and not what we call contamination from another area.   The nurse will call you to schedule this appointment.   If the repeat specimen confirms blood, then its off to the urologist as we discussed.   Thank you,   Josefa Half, MD

## 2016-05-19 NOTE — Telephone Encounter (Signed)
Patient returning your call said any time Monday will work and to just leave a message on her phone.

## 2016-05-19 NOTE — Telephone Encounter (Signed)
Left message to call Ariq Khamis at [phone]. 

## 2016-05-19 NOTE — Telephone Encounter (Signed)
Office visit scheduled for Monday 05/22/16 at 1030 per patient stating she could be scheduled anytime. Left voicemail per DPR with date and time of office visit. Instructed patient to call if this time will not work for her.    Routing to provider for final review. Patient agreeable to disposition. Will close encounter.

## 2016-05-22 ENCOUNTER — Ambulatory Visit (INDEPENDENT_AMBULATORY_CARE_PROVIDER_SITE_OTHER): Payer: PPO | Admitting: Obstetrics and Gynecology

## 2016-05-22 ENCOUNTER — Encounter: Payer: Self-pay | Admitting: Obstetrics and Gynecology

## 2016-05-22 ENCOUNTER — Other Ambulatory Visit: Payer: Self-pay | Admitting: Cardiology

## 2016-05-22 VITALS — BP 98/60 | Ht 62.75 in | Wt 108.0 lb

## 2016-05-22 DIAGNOSIS — R3129 Other microscopic hematuria: Secondary | ICD-10-CM

## 2016-05-22 MED FILL — ELIQUIS 5 MG TABLET: 5 | 90 days supply | Qty: 180 | Fill #0

## 2016-05-22 MED FILL — FLUOROURACIL 5% CREAM: 5 | 14 days supply | Qty: 40 | Fill #0

## 2016-05-22 NOTE — Progress Notes (Signed)
GYNECOLOGY  VISIT   HPI: 65 y.o.   Married  Caucasian  female   G2P2002 with Patient's last menstrual period was 08/08/1983 (approximate).   here for catherized urine specimen.   Urine dip in office showed large hematuria.  Urine micro showed 3 - 10 RBCs.  UC was negative.   Patient is on Eliquis.  Denies bleeding gums or with BMs.   Patient has lower back pain from exercise.  Denies dysuria.  Denies hematuria.  Denies renal stones.   GYNECOLOGIC HISTORY: Patient's last menstrual period was 08/08/1983 (approximate). Contraception:  Hysterectomy Menopausal hormone therapy:  none Last mammogram:  12-06-15 Density B/Neg/BiRads1:[facility] Last pap smear:  2002 Neg         OB History    Gravida Para Term Preterm AB Living   2 2 2     2    SAB TAB Ectopic Multiple Live Births                     Patient Active Problem List   Diagnosis Date Noted  . 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 Preoperative cardiovascular examination 03/18/2015  . Atrial fibrillation ([place]) 12/23/2013  . Atrial flutter ([place]) 11/05/2013  . Paroxysmal atrial fibrillation ([place]) 08/28/2013  . Palpitations 08/28/2013  . Dizziness 08/28/2013    Past Medical History:  Diagnosis Date  . Arthritis    lt shoulder  . Atrial flutter ([place])   . Depression   . Dyspareunia   . Dysrhythmia    a fib  . Fibroid   . Heart murmur   . Hypothyroidism   . Osteoporosis May, 2017  . Paroxysmal atrial fibrillation [facility])     Past Surgical History:  Procedure Laterality Date  . ABDOMINAL HYSTERECTOMY    . ABLATION  12-23-2013   PVI and CTI by Dr Rayann Heman  . ATRIAL FIBRILLATION ABLATION N/A 12/23/2013   Procedure: ATRIAL FIBRILLATION ABLATION;  Surgeon: Coralyn Mark, MD;  Location: [place] CATH LAB;  Service: Cardiovascular;  Laterality: N/A;  . AUGMENTATION MAMMAPLASTY    . BREAST ENHANCEMENT SURGERY  83,11  . COSMETIC SURGERY  2003   face  . FLEXIBLE SIGMOIDOSCOPY N/A 07/29/2013   Procedure: FLEXIBLE  SIGMOIDOSCOPY;  Surgeon: [name], MD;  [facility] ENDOSCOPY;  Service: Endoscopy;  Laterality: N/A;  . FRACTURE SURGERY Right    wrist  . TEE WITHOUT CARDIOVERSION N/A 12/22/2013   Procedure: TRANSESOPHAGEAL ECHOCARDIOGRAM (TEE);  Surgeon: [name], MD;  Location: [facility] ENDOSCOPY;  Service: Cardiovascular;  Laterality: N/A;  . TONSILLECTOMY    . TOTAL SHOULDER ARTHROPLASTY Left 04/23/2015   Procedure: LEFT TOTAL SHOULDER ARTHROPLASTY;  Surgeon: Netta Cedars, MD;  Location: [place];  Service: Orthopedics;  Laterality: Left;    Current Outpatient Prescriptions  Medication Sig Dispense Refill  . acetaminophen (TYLENOL) 325 MG tablet Take 650 mg by mouth every 6 (six) hours as needed for mild pain or moderate pain.    Marland Kitchen apixaban (ELIQUIS) 5 MG TABS tablet Take 1 tablet (5 mg total) by mouth 2 (two) times daily. Please call and schedule a one year follow up appointment 180 tablet 0  . Calcium Carb-Cholecalciferol (CALCIUM-VITAMIN D3) 500-400 MG-UNIT TABS Take 2 tablets by mouth daily.     . flecainide (TAMBOCOR) 50 MG tablet Take 1 tablet (50 mg total) by mouth 2 (two) times daily. 180 tablet 3  . levothyroxine (SYNTHROID, LEVOTHROID) 50 MCG tablet Take 50 mcg by mouth daily.    . Multiple Vitamin (MULTIVITAMIN)  capsule Take 1 capsule by mouth daily.    . sertraline (ZOLOFT) 100 MG tablet Take 50 mg by mouth daily.     . traZODone (DESYREL) 50 MG tablet Take 25-50 mg by mouth at bedtime.      No current facility-administered medications for this visit.      ALLERGIES: Review of patient's allergies indicates no known allergies.  Family History  Problem Relation Age of Onset  . Cancer Mother   . Hypertension Mother   . Thyroid disease Mother   . Cancer Paternal Grandmother   . Breast cancer Paternal Grandmother   . Thyroid disease Sister   . Stroke Maternal Grandmother     Social History   Social History  . Marital status: Married    Spouse name: N/A  . Number of  children: N/A  . Years of education: N/A   Occupational History  . Not on file.   Social History Main Topics  . Smoking status: Never Smoker  . Smokeless tobacco: Never Used  . Alcohol use 1.8 oz/week    3 Glasses of wine per week     Comment: 5 glasses of wine per week  . Drug use: No  . Sexual activity: Not Currently    Partners: Male    Birth control/ protection: None, Surgical     Comment: TVH--still has ovaries   Other Topics Concern  . Not on file   Social History Narrative   Pt lives in [place] with spouse.  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    ROS:  Pertinent items are noted in HPI.  PHYSICAL EXAMINATION:    BP 98/60 (BP Location: Right Arm, Patient Position: Sitting, Cuff Size: Small)   Ht 5' 2.75" (1.594 m)   Wt 108 lb (49 kg)   LMP 08/08/1983 (Approximate)   BMI 19.28 kg/m     General appearance: alert, cooperative and appears stated age    Pelvic: External genitalia:  no lesions              Urethra:  normal appearing urethra with no masses, tenderness or lesions.  Minor urethral prolapse.               Bartholins and Skenes: normal                 Vagina: normal appearing vagina with normal color and discharge, no lesions              Cervix: no lesions                Bimanual Exam:  Uterus:  normal size, contour, position, consistency, mobility, non-tender              Adnexa: no mass, fullness, tenderness              Bladder not painful to palpation. No masses.  Catheter specimen urine - verbal consent for procedure . Sterile prep with betadine.  Catheterization done - clear urine about 15 cc obtained.  Sent for urinalysis and urine micro.  No complications.   Chaperone was present for exam.  ASSESSMENT  Microscopic hematuria on voided specimen.  On Eliquis.  PLAN  Urine dip and micro from sterile cath specimen.  Rational for testing discussed.  To urology if hematuria is confirmed.  Explained work up would include cystoscopy  and imaging of kidneys.    An After Visit Summary was printed and given to the patient.  ___15___ minutes face to face time of  which over 50% was spent in counseling.

## 2016-05-23 LAB — URINALYSIS
BILIRUBIN URINE: NEGATIVE
GLUCOSE, UA: NEGATIVE
Hgb urine dipstick: NEGATIVE
Ketones, ur: NEGATIVE
Leukocytes, UA: NEGATIVE
Nitrite: NEGATIVE
Protein, ur: NEGATIVE
SPECIFIC GRAVITY, URINE: 1.007 (ref 1.001–1.035)
pH: 6.5 (ref 5.0–8.0)

## 2016-05-23 LAB — URINALYSIS, MICROSCOPIC ONLY
BACTERIA UA: NONE SEEN [HPF]
Casts: NONE SEEN [LPF]
Crystals: NONE SEEN [HPF]
RBC / HPF: NONE SEEN RBC/HPF (ref <=2)
SQUAMOUS EPITHELIAL / LPF: NONE SEEN [HPF] (ref <=5)
WBC UA: NONE SEEN WBC/HPF (ref <=5)
YEAST: NONE SEEN [HPF]

## 2016-05-29 ENCOUNTER — Ambulatory Visit (INDEPENDENT_AMBULATORY_CARE_PROVIDER_SITE_OTHER): Payer: PPO | Admitting: Internal Medicine

## 2016-05-29 VITALS — BP 102/68 | HR 67 | Ht 62.75 in | Wt 108.4 lb

## 2016-05-29 DIAGNOSIS — I480 Paroxysmal atrial fibrillation: Secondary | ICD-10-CM | POA: Diagnosis not present

## 2016-05-29 NOTE — Progress Notes (Signed)
PCP: [name], MD Primary Cardiologist:  [name] is a 65 y.o. female who presents today for routine electrophysiology followup.   She is doing well.  She is unaware of any afib in the past year. Today, she denies symptoms of  chest pain, shortness of breath,  lower extremity edema, dizziness, presyncope, or syncope.  The patient is otherwise without complaint today.   Past Medical History:  Diagnosis Date  . Arthritis    lt shoulder  . Atrial flutter ([place])   . Depression   . Dyspareunia   . Dysrhythmia    a fib  . Fibroid   . Heart murmur   . Hypothyroidism   . Osteoporosis May, 2017  . Paroxysmal atrial fibrillation [facility])    Past Surgical History:  Procedure Laterality Date  . ABDOMINAL HYSTERECTOMY    . ABLATION  12-23-2013   PVI and CTI by Dr Rayann Heman  . ATRIAL FIBRILLATION ABLATION N/A 12/23/2013   Procedure: ATRIAL FIBRILLATION ABLATION;  Surgeon: Coralyn Mark, MD;  Location: [place] CATH LAB;  Service: Cardiovascular;  Laterality: N/A;  . AUGMENTATION MAMMAPLASTY    . BREAST ENHANCEMENT SURGERY  83,11  . COSMETIC SURGERY  2003   face  . FLEXIBLE SIGMOIDOSCOPY N/A 07/29/2013   Procedure: FLEXIBLE SIGMOIDOSCOPY;  Surgeon: [name], MD;  [facility] ENDOSCOPY;  Service: Endoscopy;  Laterality: N/A;  . FRACTURE SURGERY Right    wrist  . TEE WITHOUT CARDIOVERSION N/A 12/22/2013   Procedure: TRANSESOPHAGEAL ECHOCARDIOGRAM (TEE);  Surgeon: [name], MD;  Location: [facility] ENDOSCOPY;  Service: Cardiovascular;  Laterality: N/A;  . TONSILLECTOMY    . TOTAL SHOULDER ARTHROPLASTY Left 04/23/2015   Procedure: LEFT TOTAL SHOULDER ARTHROPLASTY;  Surgeon: Netta Cedars, MD;  Location: [place];  Service: Orthopedics;  Laterality: Left;    ROS- all systems are reviewed and negatives except as per HPI above  Current Outpatient Prescriptions  Medication Sig Dispense Refill  . acetaminophen (TYLENOL) 325 MG tablet Take 650 mg by mouth every 6 (six)  hours as needed for mild pain or moderate pain.    Marland Kitchen apixaban (ELIQUIS) 5 MG TABS tablet Take 1 tablet (5 mg total) by mouth 2 (two) times daily. 180 tablet 0  . Calcium Carbonate-Vitamin D (CALCIUM-VITAMIN D) 600-125 MG-UNIT TABS Take 1 tablet by mouth 2 (two) times daily.    . flecainide (TAMBOCOR) 50 MG tablet Take 1 tablet (50 mg total) by mouth 2 (two) times daily. 180 tablet 3  . levothyroxine (SYNTHROID, LEVOTHROID) 50 MCG tablet Take 50 mcg by mouth daily.    . Magnesium 200 MG TABS Take 1 tablet by mouth daily.    . Multiple Vitamin (MULTIVITAMIN) capsule Take 1 capsule by mouth daily.    . sertraline (ZOLOFT) 50 MG tablet Take 50 mg by mouth daily.    . traZODone (DESYREL) 50 MG tablet Take 25-50 mg by mouth at bedtime.      No current facility-administered medications for this visit.     Physical Exam: Vitals  Vitals:   05/29/16 1143  BP: 102/68  Pulse: 67   GEN- The patient is well appearing, alert and oriented x 3 today.   Head- normocephalic, atraumatic Eyes-  Sclera clear, conjunctiva pink Ears- hearing intact Oropharynx- clear Lungs- Clear to ausculation bilaterally, normal work of breathing Heart- Regular rate and rhythm, no murmurs, rubs or gallops, PMI not laterally displaced GI- soft, NT, ND, + BS Extremities- no clubbing, cyanosis, or  edema  ekg today reveals sinus rhythm 67 bpm, PR 210   Assessment and Plan:  1. Afib/ atrial flutter Doing well s/p ablation chads2vasc score is 2.  On eliquis She wishes to continue flecainide though I have offered options of reducing to 25mg  BID or even stopping this medicine  Return to see me in 1 year  Thompson Grayer MD, [facility] 05/29/2016 11:40 AM

## 2016-05-29 NOTE — Patient Instructions (Signed)

## 2016-06-16 DIAGNOSIS — Z0100 Encounter for examination of eyes and vision without abnormal findings: Secondary | ICD-10-CM | POA: Diagnosis not present

## 2016-06-16 DIAGNOSIS — H2513 Age-related nuclear cataract, bilateral: Secondary | ICD-10-CM | POA: Diagnosis not present

## 2016-06-16 DIAGNOSIS — H25013 Cortical age-related cataract, bilateral: Secondary | ICD-10-CM | POA: Diagnosis not present

## 2016-06-21 MED FILL — traZODone HCL 50 MG TABS: 50 | 30 days supply | Qty: 30 | Fill #1

## 2016-06-28 ENCOUNTER — Telehealth: Payer: Self-pay

## 2016-06-28 NOTE — Telephone Encounter (Signed)
Call to [employer]. Had to leave message for a return call from a representative regarding PA approval of patient's Prolia injection. Need to discuss next steps for filling medication.

## 2016-07-03 NOTE — Telephone Encounter (Signed)
Spoke with Terri [name] at [facility] who states that PA is not required as this medication is on the patient's formulary. However Terri [name] is receiving a rejection to fill the medication. Per Terri [name] she will send a request to have the pharmacy claims department return call to the office to discuss benefit coverage and proceeding with Prolia.

## 2016-07-03 NOTE — Telephone Encounter (Signed)
Spoke with Robin with [employer]. Provided additional information to Shirlean Mylar for submission for PA of Prolia. Per Shirlean Mylar form has been submitted for review and our office will receive a fax in 72 hours with approval or denial.

## 2016-07-04 ENCOUNTER — Other Ambulatory Visit: Payer: Self-pay | Admitting: Cardiology

## 2016-07-04 DIAGNOSIS — I480 Paroxysmal atrial fibrillation: Secondary | ICD-10-CM

## 2016-07-04 DIAGNOSIS — I4891 Unspecified atrial fibrillation: Secondary | ICD-10-CM

## 2016-07-04 NOTE — Telephone Encounter (Signed)
Patient received a letter from her insurance company stating that she has been approved for [employer]. Patient would like to have this injection done in January 2018.

## 2016-07-04 NOTE — Telephone Encounter (Signed)
Spoke with [employer] who states that Prolia PA has been approved. Per pharmacy representative [place] will need to be filled through [place]. Spoke with patient. Patient is verbalizes understanding. Patient would like to wait until January to have her initial Prolia injection. Aware she will need to have her calcium level checked before Prolia injection can be given. Once calcium level has returned if everything is normal Prolia can be ordered to [facility]. The patient will have to authorize shipment of the medication to the office. Once a shipment date is scheduled she will be scheduled for her injection. The patient is agreeable and will contact the office in January when she is ready to proceed.   Routing to provider for final review. Patient agreeable to disposition. Will close encounter.

## 2016-07-05 ENCOUNTER — Other Ambulatory Visit: Payer: Self-pay | Admitting: Internal Medicine

## 2016-07-05 MED ORDER — FLECAINIDE ACETATE 50 MG PO TABS: 50.0000 mg | ORAL_TABLET | Freq: Two times a day (BID) | ORAL | 3 refills | Status: DC

## 2016-07-05 MED FILL — FLECAINIDE ACETATE 50 MG TA: 50 | 90 days supply | Qty: 180 | Fill #0

## 2016-07-05 MED FILL — PREDNISOLONE AC 1% EYE DROP: 1 | 50 days supply | Qty: 10 | Fill #0

## 2016-07-11 DIAGNOSIS — H25012 Cortical age-related cataract, left eye: Secondary | ICD-10-CM | POA: Diagnosis not present

## 2016-07-11 DIAGNOSIS — H25812 Combined forms of age-related cataract, left eye: Secondary | ICD-10-CM | POA: Diagnosis not present

## 2016-07-11 DIAGNOSIS — H2512 Age-related nuclear cataract, left eye: Secondary | ICD-10-CM | POA: Diagnosis not present

## 2016-07-18 MED FILL — SYNTHROID 50 MCG TABLET: 50 | 90 days supply | Qty: 90 | Fill #1 | Status: TO

## 2016-07-25 DIAGNOSIS — H2511 Age-related nuclear cataract, right eye: Secondary | ICD-10-CM | POA: Diagnosis not present

## 2016-07-25 DIAGNOSIS — H25011 Cortical age-related cataract, right eye: Secondary | ICD-10-CM | POA: Diagnosis not present

## 2016-07-25 DIAGNOSIS — H25811 Combined forms of age-related cataract, right eye: Secondary | ICD-10-CM | POA: Diagnosis not present

## 2016-08-09 ENCOUNTER — Telehealth: Payer: Self-pay

## 2016-08-09 DIAGNOSIS — M810 Age-related osteoporosis without current pathological fracture: Secondary | ICD-10-CM

## 2016-08-09 NOTE — Telephone Encounter (Signed)
Spoke with patient at time of incoming call. Patient would like to proceed with Prolia injection. Prolia has been approved by her insurance company (see telephone encounter dated 06/28/2016). Advised patient she will need to be seen for a calcium level check prior to receiving Prolia. Patient is agreeable. Lab appointment scheduled for 08/11/2016 at 11 am. Patient is agreeable to date and time. Aware she will be contacted with the results. As long as results are normal Prolia will need to be sent to [place] for shipment.  Routing to provider for final review. Patient agreeable to disposition. Will close encounter.

## 2016-08-11 ENCOUNTER — Other Ambulatory Visit (INDEPENDENT_AMBULATORY_CARE_PROVIDER_SITE_OTHER): Payer: PPO

## 2016-08-11 DIAGNOSIS — M810 Age-related osteoporosis without current pathological fracture: Secondary | ICD-10-CM | POA: Diagnosis not present

## 2016-08-11 DIAGNOSIS — N810 Urethrocele: Secondary | ICD-10-CM

## 2016-08-11 LAB — CALCIUM: CALCIUM: 9 mg/dL (ref 8.6–10.4)

## 2016-08-14 ENCOUNTER — Telehealth: Payer: Self-pay

## 2016-08-14 MED ORDER — DENOSUMAB 60 MG/ML ~~LOC~~ SOLN: 60.0000 mg | SUBCUTANEOUS | 1 refills | Status: DC

## 2016-08-14 NOTE — Telephone Encounter (Signed)
Patients pharmacy [facility] pharmacy is calling asking for a prescription for Prolia on this patient. [phone].

## 2016-08-14 NOTE — Telephone Encounter (Signed)
Spoke with pharmacist Ovid Curd. Order for Prolia inject 60 mg into skin every 6 months #1 1RF called into [place]. Ovid Curd is aware this is to be shipped to the office after patient has authorizes shipment.

## 2016-08-14 NOTE — Telephone Encounter (Signed)
Spoke with patient. Advised of message as seen below from [place]. Patient is agreeable and verbalizes understanding. Aware Rx for Prolia has been sent to [facility]. Patient will contact [place] at [phone] to authorize shipment of Prolia to the office. Aware our office will be contacted with shipment date and then she will be scheduled for injection. Patient is agreeable.

## 2016-08-14 NOTE — Telephone Encounter (Signed)
-----   Message from Nunzio Cobbs, MD sent at 08/13/2016  6:38 PM EST ----- Results to patient through My Chart. Please proceed forward with Prolia.   Hello Ms. Chesney,   Your calcium level is normal.  I will forward this result to the nurses who will help to coordinate the next steps for your Prolia medication.   Happy New Year,   Josefa Half, MD

## 2016-08-16 MED FILL — TRETINOIN 0.05% CREAM: 0.05 | 14 days supply | Qty: 20 | Fill #0

## 2016-08-20 MED FILL — traZODone HCL 50 MG TABS: 50 | 30 days supply | Qty: 30 | Fill #2

## 2016-08-21 ENCOUNTER — Other Ambulatory Visit: Payer: Self-pay | Admitting: Cardiology

## 2016-08-21 MED FILL — SERTRALINE HCL 100 MG TAB: 100 | 90 days supply | Qty: 135 | Fill #1

## 2016-08-22 MED FILL — ELIQUIS 5 MG TABLET: 5 | 90 days supply | Qty: 180 | Fill #0

## 2016-08-22 NOTE — Telephone Encounter (Signed)
Spoke with patient. Advised I have spoken with [facility] and they have been unable to reach her regarding authorization of shipment. Patient provided telephone number [phone] to contact [place] to authorize shipment. Patient is agreeable.

## 2016-08-22 NOTE — Telephone Encounter (Signed)
Spoke with [facility] pharmacy to check on status of Prolia delivery. Per representative the patient has been contacted with no return call regarding authorizing delivery.   Left message for patient to call [place] at [phone].

## 2016-08-22 NOTE — Telephone Encounter (Signed)
Return call to Kaitlyn. °

## 2016-08-30 ENCOUNTER — Encounter: Payer: Self-pay | Admitting: Internal Medicine

## 2016-08-30 ENCOUNTER — Telehealth: Payer: Self-pay | Admitting: Obstetrics and Gynecology

## 2016-08-30 NOTE — Telephone Encounter (Signed)
Patient is asking to talk with Dr.Silva's nurse regarding her Prolia injection. Patient said "I do not feel good about this at all."

## 2016-08-30 NOTE — Telephone Encounter (Signed)
Spoke with Terri [name] at [facility]. Shipment verified to the office for 09/01/2016. Spoke with patient. Appointment for Prolia injection scheduled for 09/07/2016 at 10:30 am. Patient is agreeable to date and time.  Routing to provider for final review. Patient agreeable to disposition. Will close encounter.

## 2016-08-31 NOTE — Telephone Encounter (Signed)
Patient choice to treat or not.  Her BMD showed she is just barely into the diagnosis of osteoporosis.  She is at risk for fracture.  If she would like a second opinion, I would be happy to send her to endocrinology.  I do not recommend Evista due to her cardiac history.   Continue weight bearing exercise, calcium, vit D, and repeat BMD 2 years form the date of the prior study.

## 2016-08-31 NOTE — Telephone Encounter (Signed)
Spoke with patient. Advised of message as seen below from [place]. Patient verbalizes understanding. Patient does not want second opinion at this time. Will continue weight bearing exercise. Calcium, and vitamin D until due for next BMD.  Routing to provider for final review. Patient agreeable to disposition. Will close encounter.

## 2016-08-31 NOTE — Telephone Encounter (Signed)
Spoke with patient. Patient states that she has decided she does not want to move forward with Prolia. Spoke with a friend and did some research about Prolia and possible side effects and does not wish to proceed. Patient has contacted [facility] and cancelled shipment. Wants to cancel nurse visit for 09/07/2016. Appointment cancelled. Patient has been on Actonel previously and states she had extreme bone pain. Does not desire to start on any new medication at this time unless Dr.Silva feels strongly that she needs to. Advised I will let Dr.Silva know and return call with any additional recommendations. Patient is agreeable.

## 2016-09-06 DIAGNOSIS — L821 Other seborrheic keratosis: Secondary | ICD-10-CM | POA: Diagnosis not present

## 2016-09-06 DIAGNOSIS — L570 Actinic keratosis: Secondary | ICD-10-CM | POA: Diagnosis not present

## 2016-09-07 ENCOUNTER — Ambulatory Visit: Payer: PPO

## 2016-10-13 MED FILL — traZODone HCL 50 MG TABS: 50 | 30 days supply | Qty: 30 | Fill #3

## 2016-10-13 MED FILL — SYNTHROID 50 MCG TABLET: 50 | 90 days supply | Qty: 90 | Fill #2 | Status: TO

## 2016-10-13 MED FILL — FLECAINIDE ACETATE 50 MG TA: 50 | 90 days supply | Qty: 180 | Fill #1

## 2016-10-19 ENCOUNTER — Encounter: Payer: Self-pay | Admitting: Internal Medicine

## 2016-11-22 MED FILL — traZODone HCL 50 MG TABS: 50 | 30 days supply | Qty: 30 | Fill #4

## 2016-11-24 MED FILL — ELIQUIS 5 MG TABLET: 5 | 90 days supply | Qty: 180 | Fill #1

## 2016-12-04 ENCOUNTER — Other Ambulatory Visit: Payer: Self-pay | Admitting: Obstetrics and Gynecology

## 2016-12-04 DIAGNOSIS — Z1231 Encounter for screening mammogram for malignant neoplasm of breast: Secondary | ICD-10-CM

## 2016-12-28 ENCOUNTER — Ambulatory Visit: Payer: PPO

## 2016-12-29 MED FILL — traZODone HCL 50 MG TABS: 50 | 30 days supply | Qty: 30 | Fill #5

## 2017-01-02 ENCOUNTER — Telehealth: Payer: Self-pay | Admitting: Internal Medicine

## 2017-01-02 NOTE — Telephone Encounter (Signed)
Called patient about her message. Patient stated she thinks she is going in and out of A. FIB. Patient stated she only feels this way when she exercises or with activity. Patient continues to take her eliquis and flecainide as directed. Made an appointment according to patient's schedule and first available with A. Fib clinic. Will forward to Dr. Rayann Heman and his nurse for further advisement.

## 2017-01-02 NOTE — Telephone Encounter (Signed)
New message   Patient calling S/p Afib procedure done x 2 years ago.   C/O atrial flutter again.

## 2017-01-08 ENCOUNTER — Telehealth: Payer: Self-pay | Admitting: Internal Medicine

## 2017-01-08 NOTE — Telephone Encounter (Signed)
Spoke with patient and let her know afib clinic is great.  She will keep her appointment for 01/10/17

## 2017-01-08 NOTE — Telephone Encounter (Signed)
New message   Pt states that she has afib and had an ablation 2 years ago and it came back full force. She states she made an appt with the afib clinic and wants to make sure that this is what Dr. Rayann Heman suggests that she do.   Patient c/o Palpitations:  High priority if patient c/o lightheadedness and shortness of breath.  1. How long have you been having palpitations? Every now and then  2. Are you currently experiencing lightheadedness and shortness of breath? Some weeks ago  3. Have you checked your BP and heart rate? (document readings) not yet (this happened last week and she doesn't have those reading at this time)  4. Are you experiencing any other symptoms? Just rapid heart rate and all afib symptoms coming back.

## 2017-01-10 ENCOUNTER — Encounter (HOSPITAL_COMMUNITY): Payer: Self-pay | Admitting: Nurse Practitioner

## 2017-01-10 ENCOUNTER — Ambulatory Visit (HOSPITAL_COMMUNITY)
Admission: RE | Admit: 2017-01-10 | Discharge: 2017-01-10 | Disposition: A | Discharge status: Home / Self Care | Payer: PPO | Source: Ambulatory Visit | Attending: Nurse Practitioner | Admitting: Nurse Practitioner

## 2017-01-10 VITALS — BP 98/64 | HR 67 | Ht 62.75 in | Wt 107.6 lb

## 2017-01-10 DIAGNOSIS — Z7901 Long term (current) use of anticoagulants: Secondary | ICD-10-CM | POA: Diagnosis not present

## 2017-01-10 DIAGNOSIS — F329 Major depressive disorder, single episode, unspecified: Secondary | ICD-10-CM | POA: Insufficient documentation

## 2017-01-10 DIAGNOSIS — Z79899 Other long term (current) drug therapy: Secondary | ICD-10-CM | POA: Insufficient documentation

## 2017-01-10 DIAGNOSIS — I480 Paroxysmal atrial fibrillation: Secondary | ICD-10-CM | POA: Diagnosis not present

## 2017-01-10 DIAGNOSIS — I4891 Unspecified atrial fibrillation: Secondary | ICD-10-CM | POA: Diagnosis not present

## 2017-01-10 DIAGNOSIS — Z9889 Other specified postprocedural states: Secondary | ICD-10-CM | POA: Diagnosis not present

## 2017-01-10 DIAGNOSIS — E039 Hypothyroidism, unspecified: Secondary | ICD-10-CM | POA: Insufficient documentation

## 2017-01-10 MED ORDER — FLECAINIDE ACETATE 50 MG PO TABS: 75.0000 mg | ORAL_TABLET | Freq: Two times a day (BID) | ORAL | 3 refills | Status: DC

## 2017-01-10 NOTE — Progress Notes (Signed)
Primary Care Physician: Josetta Huddle, MD Referring Physician: Dr. Mikal Plane is a 66 y.o. female with a h/o afib, s/p ablation, on flecainide 50 mg bid. She has noted palpations for 15-20 secs several times during the day. This feels like afib to her. She has  been under extra stress lately for her daughter going thru an ugly divorce. She is planning to go to [place] this Saturday.  Today, she denies symptoms of palpitations, chest pain, shortness of breath, orthopnea, PND, lower extremity edema, dizziness, presyncope, syncope, or neurologic sequela. The patient is tolerating medications without difficulties and is otherwise without complaint today.   Past Medical History:  Diagnosis Date  . Arthritis    lt shoulder  . Atrial flutter ([place])   . Depression   . Dyspareunia   . Dysrhythmia    a fib  . Fibroid   . Heart murmur   . Hypothyroidism   . Osteoporosis May, 2017  . Paroxysmal atrial fibrillation [facility])    Past Surgical History:  Procedure Laterality Date  . ABDOMINAL HYSTERECTOMY    . ABLATION  12-23-2013   PVI and CTI by Dr Rayann Heman  . ATRIAL FIBRILLATION ABLATION N/A 12/23/2013   Procedure: ATRIAL FIBRILLATION ABLATION;  Surgeon: Coralyn Mark, MD;  Location: [place] CATH LAB;  Service: Cardiovascular;  Laterality: N/A;  . AUGMENTATION MAMMAPLASTY    . BREAST ENHANCEMENT SURGERY  83,11  . COSMETIC SURGERY  2003   face  . FLEXIBLE SIGMOIDOSCOPY N/A 07/29/2013   Procedure: FLEXIBLE SIGMOIDOSCOPY;  Surgeon: [name], MD;  Location: [facility] ENDOSCOPY;  Service: Endoscopy;  Laterality: N/A;  . FRACTURE SURGERY Right    wrist  . TEE WITHOUT CARDIOVERSION N/A 12/22/2013   Procedure: TRANSESOPHAGEAL ECHOCARDIOGRAM (TEE);  Surgeon: [name], MD;  Location: [facility] ENDOSCOPY;  Service: Cardiovascular;  Laterality: N/A;  . TONSILLECTOMY    . TOTAL SHOULDER ARTHROPLASTY Left 04/23/2015   Procedure: LEFT TOTAL SHOULDER ARTHROPLASTY;  Surgeon: Netta Cedars, MD;   Location: [place];  Service: Orthopedics;  Laterality: Left;    Current Outpatient Prescriptions  Medication Sig Dispense Refill  . acetaminophen (TYLENOL) 325 MG tablet Take 650 mg by mouth every 6 (six) hours as needed for mild pain or moderate pain.    . Calcium Carbonate-Vitamin D (CALCIUM-VITAMIN D) 600-125 MG-UNIT TABS Take 1 tablet by mouth 2 (two) times daily.    Marland Kitchen ELIQUIS 5 MG TABS tablet TAKE 1 TABLET BY MOUTH TWICE A DAY 180 tablet 1  . flecainide (TAMBOCOR) 50 MG tablet Take 1.5 tablets (75 mg total) by mouth 2 (two) times daily. 180 tablet 3  . levothyroxine (SYNTHROID, LEVOTHROID) 50 MCG tablet Take 50 mcg by mouth daily.    . Multiple Vitamin (MULTIVITAMIN) capsule Take 1 capsule by mouth daily.    . sertraline (ZOLOFT) 50 MG tablet Take 50 mg by mouth daily.    . traZODone (DESYREL) 50 MG tablet Take 25-50 mg by mouth at bedtime.      No current facility-administered medications for this encounter.     No Known Allergies  Social History   Social History  . Marital status: Married    Spouse name: N/A  . Number of children: N/A  . Years of education: N/A   Occupational History  . Not on file.   Social History Main Topics  . Smoking status: Never Smoker  . Smokeless tobacco: Never Used  . Alcohol use 1.8 oz/week    3 Glasses of wine  per week     Comment: 5 glasses of wine per week  . Drug use: No  . Sexual activity: Not Currently    Partners: Male    Birth control/ protection: None, Surgical     Comment: TVH--still has ovaries   Other Topics Concern  . Not on file   Social History Narrative   Pt lives in [place] with spouse.  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    Family History  Problem Relation Age of Onset  . Cancer Mother   . Hypertension Mother   . Thyroid disease Mother   . Cancer Paternal Grandmother   . Breast cancer Paternal Grandmother   . Thyroid disease Sister   . Stroke Maternal Grandmother     ROS- All systems are reviewed  and negative except as per the HPI above  Physical Exam: Vitals:   01/10/17 0952  BP: 98/64  Pulse: 67  Weight: 107 lb 9.6 oz (48.8 kg)  Height: 5' 2.75" (1.594 m)   Wt Readings from Last 3 Encounters:  01/10/17 107 lb 9.6 oz (48.8 kg)  05/29/16 108 lb 6 oz (49.2 kg)  05/22/16 108 lb (49 kg)    Labs: Lab Results  Component Value Date   NA 137 12/23/2015   K 4.0 12/23/2015   CL 99 12/23/2015   CO2 27 12/23/2015   GLUCOSE 111 (H) 12/23/2015   BUN 20 12/23/2015   CREATININE 0.66 12/23/2015   CALCIUM 9.0 08/11/2016   Lab Results  Component Value Date   INR 1.03 04/14/2015   Lab Results  Component Value Date   CHOL 250 (H) 09/22/2013   HDL 110.80 09/22/2013   TRIG 59.0 09/22/2013     GEN- The patient is well appearing, alert and oriented x 3 today.   Head- normocephalic, atraumatic Eyes-  Sclera clear, conjunctiva pink Ears- hearing intact Oropharynx- clear Neck- supple, no JVP Lymph- no cervical lymphadenopathy Lungs- Clear to ausculation bilaterally, normal work of breathing Heart- Regular rate and rhythm, no murmurs, rubs or gallops, PMI not laterally displaced GI- soft, NT, ND, + BS Extremities- no clubbing, cyanosis, or edema MS- no significant deformity or atrophy Skin- no rash or lesion Psych- euthymic mood, full affect Neuro- strength and sensation are intact  EKG- NSR at 67 bpm, pr int 208 ms, qrs int 86 ms, qtc 420 ms Epic records reviewed    Assessment and Plan: 1. Afib S/p ablation 2015 with nonsustained palpitations noted recently,"feels like my afib" Also planning to go to [place] x 10 days, next Saturday No rate control on board due to low blood pressure  Increase flecainide to 75 mg bid  Return next week for repeat EKG  Butch Penny C. Riccardo Holeman, [facility] [address] [place], [place] [address] [phone]

## 2017-01-10 NOTE — Patient Instructions (Signed)
Your physician has recommended you make the following change in your medication:  1)Increase flecainide to 75mg  twice a day (1 and 1/2 tablets of the 50mg  tablet)

## 2017-01-12 ENCOUNTER — Ambulatory Visit: Payer: PPO

## 2017-01-15 ENCOUNTER — Ambulatory Visit (HOSPITAL_COMMUNITY)
Admission: RE | Admit: 2017-01-15 | Discharge: 2017-01-15 | Disposition: A | Discharge status: Home / Self Care | Payer: PPO | Source: Ambulatory Visit | Attending: Nurse Practitioner | Admitting: Nurse Practitioner

## 2017-01-15 ENCOUNTER — Encounter (HOSPITAL_COMMUNITY): Payer: Self-pay | Admitting: Nurse Practitioner

## 2017-01-15 DIAGNOSIS — Z79899 Other long term (current) drug therapy: Secondary | ICD-10-CM | POA: Insufficient documentation

## 2017-01-15 DIAGNOSIS — I4891 Unspecified atrial fibrillation: Secondary | ICD-10-CM | POA: Diagnosis not present

## 2017-01-15 MED ORDER — FLECAINIDE ACETATE 50 MG PO TABS: 75.0000 mg | ORAL_TABLET | Freq: Two times a day (BID) | ORAL | 3 refills | Status: DC

## 2017-01-15 MED ORDER — FLECAINIDE ACETATE 50 MG PO TABS
75.0000 mg | ORAL_TABLET | Freq: Two times a day (BID) | ORAL | 3 refills | Status: DC
Start: 2017-01-15 — End: 2017-06-04

## 2017-01-15 MED FILL — FLECAINIDE ACETATE 50 MG TA: 50 | 90 days supply | Qty: 270 | Fill #0

## 2017-01-15 NOTE — Progress Notes (Addendum)
PT in for repeat EKG for increase of Flecainide to 75 mg bid.  Pt needs new Rx if she is to continue.  Ekg to be reviewed by Roderic Palau, NP  No significant change in EKG with increase of dose of flecainide to 75 mg bid.. She has felt much less outbreak afib

## 2017-01-16 MED FILL — SYNTHROID 50 MCG TABLET: 50 | 90 days supply | Qty: 90 | Fill #3 | Status: TO

## 2017-02-01 ENCOUNTER — Ambulatory Visit
Admission: RE | Admit: 2017-02-01 | Discharge: 2017-02-01 | Disposition: A | Discharge status: Home / Self Care | Payer: PPO | Source: Ambulatory Visit | Attending: Obstetrics and Gynecology | Admitting: Obstetrics and Gynecology

## 2017-02-01 DIAGNOSIS — Z1231 Encounter for screening mammogram for malignant neoplasm of breast: Secondary | ICD-10-CM | POA: Diagnosis not present

## 2017-02-01 MED FILL — TRETINOIN 0.05% CREAM: 0.05 | 14 days supply | Qty: 20 | Fill #1

## 2017-02-06 DIAGNOSIS — Z79899 Other long term (current) drug therapy: Secondary | ICD-10-CM | POA: Diagnosis not present

## 2017-02-06 DIAGNOSIS — K589 Irritable bowel syndrome without diarrhea: Secondary | ICD-10-CM | POA: Diagnosis not present

## 2017-02-06 DIAGNOSIS — R634 Abnormal weight loss: Secondary | ICD-10-CM | POA: Diagnosis not present

## 2017-02-06 DIAGNOSIS — I4891 Unspecified atrial fibrillation: Secondary | ICD-10-CM | POA: Diagnosis not present

## 2017-02-06 DIAGNOSIS — Z0001 Encounter for general adult medical examination with abnormal findings: Secondary | ICD-10-CM | POA: Diagnosis not present

## 2017-02-06 DIAGNOSIS — F325 Major depressive disorder, single episode, in full remission: Secondary | ICD-10-CM | POA: Diagnosis not present

## 2017-02-06 DIAGNOSIS — G4730 Sleep apnea, unspecified: Secondary | ICD-10-CM | POA: Diagnosis not present

## 2017-02-06 DIAGNOSIS — L659 Nonscarring hair loss, unspecified: Secondary | ICD-10-CM | POA: Diagnosis not present

## 2017-02-06 DIAGNOSIS — Z7901 Long term (current) use of anticoagulants: Secondary | ICD-10-CM | POA: Diagnosis not present

## 2017-02-06 DIAGNOSIS — E039 Hypothyroidism, unspecified: Secondary | ICD-10-CM | POA: Diagnosis not present

## 2017-02-06 DIAGNOSIS — M810 Age-related osteoporosis without current pathological fracture: Secondary | ICD-10-CM | POA: Diagnosis not present

## 2017-02-06 DIAGNOSIS — Z1389 Encounter for screening for other disorder: Secondary | ICD-10-CM | POA: Diagnosis not present

## 2017-02-06 DIAGNOSIS — E559 Vitamin D deficiency, unspecified: Secondary | ICD-10-CM | POA: Diagnosis not present

## 2017-02-27 ENCOUNTER — Other Ambulatory Visit: Payer: Self-pay | Admitting: Internal Medicine

## 2017-02-28 MED FILL — ELIQUIS 5 MG TABLET: 5 | 90 days supply | Qty: 180 | Fill #0

## 2017-02-28 NOTE — Telephone Encounter (Signed)
Age 66years Saw Roderic Palau on 01/10/2017  Wt 48.8kg 01/10/2017 Labs done at Dr Inda Merlin office on 02/06/2017   SrCR 0.75 Hgb 13.4 HCT 39.1 Refill done for Eliquis 5mg  q 12 hours

## 2017-03-27 MED FILL — traZODone HCL 50 MG TABS: 50 | 30 days supply | Qty: 30 | Fill #0

## 2017-04-17 MED FILL — SYNTHROID 50 MCG TABLET: 50 | 90 days supply | Qty: 90 | Fill #4 | Status: TO

## 2017-05-01 DIAGNOSIS — M25512 Pain in left shoulder: Secondary | ICD-10-CM | POA: Diagnosis not present

## 2017-05-30 ENCOUNTER — Ambulatory Visit: Payer: PPO | Admitting: Obstetrics and Gynecology

## 2017-05-30 MED FILL — ELIQUIS 5 MG TABLET: 5 | 90 days supply | Qty: 180 | Fill #1

## 2017-05-31 MED FILL — traZODone HCL 50 MG TABS: 50 | 30 days supply | Qty: 30 | Fill #1

## 2017-06-01 MED FILL — SERTRALINE HCL 100 MG TAB: 100 | 90 days supply | Qty: 135 | Fill #0

## 2017-06-04 ENCOUNTER — Ambulatory Visit (HOSPITAL_COMMUNITY)
Admission: RE | Admit: 2017-06-04 | Discharge: 2017-06-04 | Disposition: A | Discharge status: Home / Self Care | Payer: PPO | Source: Ambulatory Visit | Attending: Nurse Practitioner | Admitting: Nurse Practitioner

## 2017-06-04 ENCOUNTER — Encounter (HOSPITAL_COMMUNITY): Payer: Self-pay | Admitting: Nurse Practitioner

## 2017-06-04 VITALS — BP 100/70 | HR 67 | Wt 107.0 lb

## 2017-06-04 DIAGNOSIS — I480 Paroxysmal atrial fibrillation: Secondary | ICD-10-CM | POA: Diagnosis not present

## 2017-06-04 DIAGNOSIS — Z9889 Other specified postprocedural states: Secondary | ICD-10-CM | POA: Insufficient documentation

## 2017-06-04 DIAGNOSIS — Z803 Family history of malignant neoplasm of breast: Secondary | ICD-10-CM | POA: Insufficient documentation

## 2017-06-04 DIAGNOSIS — I4892 Unspecified atrial flutter: Secondary | ICD-10-CM | POA: Diagnosis not present

## 2017-06-04 DIAGNOSIS — E039 Hypothyroidism, unspecified: Secondary | ICD-10-CM | POA: Insufficient documentation

## 2017-06-04 DIAGNOSIS — Z823 Family history of stroke: Secondary | ICD-10-CM | POA: Insufficient documentation

## 2017-06-04 DIAGNOSIS — Z90710 Acquired absence of both cervix and uterus: Secondary | ICD-10-CM | POA: Diagnosis not present

## 2017-06-04 DIAGNOSIS — Z8249 Family history of ischemic heart disease and other diseases of the circulatory system: Secondary | ICD-10-CM | POA: Diagnosis not present

## 2017-06-04 DIAGNOSIS — Z79899 Other long term (current) drug therapy: Secondary | ICD-10-CM | POA: Diagnosis not present

## 2017-06-04 DIAGNOSIS — F329 Major depressive disorder, single episode, unspecified: Secondary | ICD-10-CM | POA: Insufficient documentation

## 2017-06-04 DIAGNOSIS — Z7901 Long term (current) use of anticoagulants: Secondary | ICD-10-CM | POA: Diagnosis not present

## 2017-06-04 DIAGNOSIS — Z8349 Family history of other endocrine, nutritional and metabolic diseases: Secondary | ICD-10-CM | POA: Diagnosis not present

## 2017-06-04 MED FILL — FLECAINIDE ACETATE 50 MG TA: 50 | 90 days supply | Qty: 270 | Fill #1

## 2017-06-04 NOTE — Progress Notes (Signed)
Primary Care Physician: Josetta Huddle, MD Referring Physician: Dr. Mikal Plane is a 66 y.o. female with a h/o afib, s/p ablation, on flecainide 50 mg bid. She was seen earlier in the summer with episodes of afib and was going to [place] so flecainide was increased to 75 mg bid. She did not have any issues in [place] so decreased the dose back to 50 mg bid.   She is in the afib clinic today and is doing well. No afib that she is aware of. Continues on Eliquis 5 mg bid for chadsvasc score of at least 2.   Today, she denies symptoms of palpitations, chest pain, shortness of breath, orthopnea, PND, lower extremity edema, dizziness, presyncope, syncope, or neurologic sequela. The patient is tolerating medications without difficulties and is otherwise without complaint today.   Past Medical History:  Diagnosis Date  . Arthritis    lt shoulder  . Atrial flutter ([place])   . Depression   . Dyspareunia   . Dysrhythmia    a fib  . Fibroid   . Heart murmur   . Hypothyroidism   . Osteoporosis May, 2017  . Paroxysmal atrial fibrillation [facility])    Past Surgical History:  Procedure Laterality Date  . ABDOMINAL HYSTERECTOMY    . ABLATION  12-23-2013   PVI and CTI by Dr Rayann Heman  . ATRIAL FIBRILLATION ABLATION N/A 12/23/2013   Procedure: ATRIAL FIBRILLATION ABLATION;  Surgeon: Coralyn Mark, MD;  Location: [place] CATH LAB;  Service: Cardiovascular;  Laterality: N/A;  . AUGMENTATION MAMMAPLASTY    . BREAST ENHANCEMENT SURGERY  83,11  . COSMETIC SURGERY  2003   face  . FLEXIBLE SIGMOIDOSCOPY N/A 07/29/2013   Procedure: FLEXIBLE SIGMOIDOSCOPY;  Surgeon: [name], MD;  [facility] ENDOSCOPY;  Service: Endoscopy;  Laterality: N/A;  . FRACTURE SURGERY Right    wrist  . TEE WITHOUT CARDIOVERSION N/A 12/22/2013   Procedure: TRANSESOPHAGEAL ECHOCARDIOGRAM (TEE);  Surgeon: [name], MD;  Location: [facility] ENDOSCOPY;  Service: Cardiovascular;  Laterality: N/A;  . TONSILLECTOMY    .  TOTAL SHOULDER ARTHROPLASTY Left 04/23/2015   Procedure: LEFT TOTAL SHOULDER ARTHROPLASTY;  Surgeon: Netta Cedars, MD;  Location: [place];  Service: Orthopedics;  Laterality: Left;    Current Outpatient Prescriptions  Medication Sig Dispense Refill  . acetaminophen (TYLENOL) 325 MG tablet Take 650 mg by mouth every 6 (six) hours as needed for mild pain or moderate pain.    . Calcium Carbonate-Vitamin D (CALCIUM-VITAMIN D) 600-125 MG-UNIT TABS Take 1 tablet by mouth 2 (two) times daily.    Marland Kitchen ELIQUIS 5 MG TABS tablet TAKE 1 TABLET BY MOUTH TWICE A DAY 180 tablet 1  . flecainide (TAMBOCOR) 50 MG tablet Take 50 mg by mouth once.    Marland Kitchen levothyroxine (SYNTHROID, LEVOTHROID) 50 MCG tablet Take 50 mcg by mouth daily.    . sertraline (ZOLOFT) 50 MG tablet Take 50 mg by mouth daily.    . traZODone (DESYREL) 50 MG tablet Take 25-50 mg by mouth at bedtime.      No current facility-administered medications for this encounter.     No Known Allergies  Social History   Social History  . Marital status: Married    Spouse name: N/A  . Number of children: N/A  . Years of education: N/A   Occupational History  . Not on file.   Social History Main Topics  . Smoking status: Never Smoker  . Smokeless tobacco: Never Used  .  Alcohol use 1.8 oz/week    3 Glasses of wine per week     Comment: 5 glasses of wine per week  . Drug use: No  . Sexual activity: Not Currently    Partners: Male    Birth control/ protection: None, Surgical     Comment: TVH--still has ovaries   Other Topics Concern  . Not on file   Social History Narrative   Pt lives in [place] with spouse.  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    Family History  Problem Relation Age of Onset  . Cancer Mother   . Hypertension Mother   . Thyroid disease Mother   . Cancer Paternal Grandmother   . Breast cancer Paternal Grandmother   . Thyroid disease Sister   . Stroke Maternal Grandmother     ROS- All systems are reviewed and  negative except as per the HPI above  Physical Exam: Vitals:   06/04/17 1143  BP: 100/70  Pulse: 67  SpO2: 98%  Weight: 107 lb (48.5 kg)   Wt Readings from Last 3 Encounters:  06/04/17 107 lb (48.5 kg)  01/10/17 107 lb 9.6 oz (48.8 kg)  05/29/16 108 lb 6 oz (49.2 kg)    Labs: Lab Results  Component Value Date   NA 137 12/23/2015   K 4.0 12/23/2015   CL 99 12/23/2015   CO2 27 12/23/2015   GLUCOSE 111 (H) 12/23/2015   BUN 20 12/23/2015   CREATININE 0.66 12/23/2015   CALCIUM 9.0 08/11/2016   Lab Results  Component Value Date   INR 1.03 04/14/2015   Lab Results  Component Value Date   CHOL 250 (H) 09/22/2013   HDL 110.80 09/22/2013   TRIG 59.0 09/22/2013     GEN- The patient is well appearing, alert and oriented x 3 today.   Head- normocephalic, atraumatic Eyes-  Sclera clear, conjunctiva pink Ears- hearing intact Oropharynx- clear Neck- supple, no JVP Lymph- no cervical lymphadenopathy Lungs- Clear to ausculation bilaterally, normal work of breathing Heart- Regular rate and rhythm, no murmurs, rubs or gallops, PMI not laterally displaced GI- soft, NT, ND, + BS Extremities- no clubbing, cyanosis, or edema MS- no significant deformity or atrophy Skin- no rash or lesion Psych- euthymic mood, full affect Neuro- strength and sensation are intact  EKG- NSR at 69 bpm, pr int 204 ms, qrs int 90 ms, qtc 417 ms Epic records reviewed    Assessment and Plan: 1. Afib S/p ablation 2015 with quiet afib No rate control on board due to low blood pressure  Continue flecainide 50  mg bid  Continue eliquis 5 mg bid for chadsvasc score of at least 2   Return in 6 months  Butch Penny C. Angie Hogg, [facility] [address] [place], [place] [address] [phone]

## 2017-06-22 DIAGNOSIS — Z23 Encounter for immunization: Secondary | ICD-10-CM | POA: Diagnosis not present

## 2017-06-22 MED FILL — SHINGRIX 50 MCG SUS: 50 | 1 days supply | Qty: 1 | Fill #0

## 2017-07-19 MED FILL — SYNTHROID 50 MCG TABLET: 50 | 90 days supply | Qty: 90 | Fill #0

## 2017-07-19 MED FILL — traZODone HCL 50 MG TABS: 50 | 30 days supply | Qty: 30 | Fill #2

## 2017-08-02 ENCOUNTER — Ambulatory Visit: Payer: PPO | Admitting: Obstetrics and Gynecology

## 2017-08-03 ENCOUNTER — Ambulatory Visit: Payer: PPO | Admitting: Obstetrics and Gynecology

## 2017-08-23 DIAGNOSIS — D1801 Hemangioma of skin and subcutaneous tissue: Secondary | ICD-10-CM | POA: Diagnosis not present

## 2017-08-23 DIAGNOSIS — L814 Other melanin hyperpigmentation: Secondary | ICD-10-CM | POA: Diagnosis not present

## 2017-08-23 DIAGNOSIS — L570 Actinic keratosis: Secondary | ICD-10-CM | POA: Diagnosis not present

## 2017-08-23 DIAGNOSIS — L821 Other seborrheic keratosis: Secondary | ICD-10-CM | POA: Diagnosis not present

## 2017-08-30 DIAGNOSIS — Z961 Presence of intraocular lens: Secondary | ICD-10-CM | POA: Diagnosis not present

## 2017-08-30 DIAGNOSIS — H26493 Other secondary cataract, bilateral: Secondary | ICD-10-CM | POA: Diagnosis not present

## 2017-09-05 ENCOUNTER — Encounter: Payer: Self-pay | Admitting: Obstetrics & Gynecology

## 2017-09-05 ENCOUNTER — Other Ambulatory Visit (HOSPITAL_COMMUNITY)
Admission: RE | Admit: 2017-09-05 | Discharge: 2017-09-05 | Disposition: A | Discharge status: Home / Self Care | Payer: PPO | Source: Ambulatory Visit | Attending: Obstetrics & Gynecology | Admitting: Obstetrics & Gynecology

## 2017-09-05 ENCOUNTER — Ambulatory Visit (INDEPENDENT_AMBULATORY_CARE_PROVIDER_SITE_OTHER): Payer: PPO | Admitting: Obstetrics & Gynecology

## 2017-09-05 VITALS — BP 100/60 | HR 70 | Resp 16 | Ht 63.0 in | Wt 108.0 lb

## 2017-09-05 DIAGNOSIS — M816 Localized osteoporosis [Lequesne]: Secondary | ICD-10-CM | POA: Diagnosis not present

## 2017-09-05 DIAGNOSIS — Z01419 Encounter for gynecological examination (general) (routine) without abnormal findings: Secondary | ICD-10-CM

## 2017-09-05 DIAGNOSIS — Z124 Encounter for screening for malignant neoplasm of cervix: Secondary | ICD-10-CM

## 2017-09-05 NOTE — Progress Notes (Signed)
67 y.o. [id number] MarriedCaucasianF here for annual exam.  Doing well.  Followed by cardiology.  Has borderline osteoporosis.    Patient's last menstrual period was 08/08/1983 (approximate).          Sexually active: No.  The current method of family planning is status post hysterectomy.  Exercising: Yes.    cardio, weights Smoker:  no  Health Maintenance: Pap:  2002 Neg  History of abnormal Pap:  yes MMG:  02/01/17 BIRADS1:neg  Colonoscopy:  11/10/15, no polyp, Dr. Oletta Lamas. BMD:   12/06/15 Osteoporosis TDaP:  2011  Pneumonia vaccine(s):  Done Shingrix:   Completed Hep C testing: 12/23/15 neg  Screening Labs: PCP   reports that  has never smoked. she has never used smokeless tobacco. She reports that she drinks about 1.8 oz of alcohol per week. She reports that she does not use drugs.  Past Medical History:  Diagnosis Date  . Arthritis    lt shoulder  . Atrial flutter ([place])   . Depression   . Dyspareunia   . Dysrhythmia    a fib  . Fibroid   . Heart murmur   . Hypothyroidism   . Osteoporosis May, 2017  . Paroxysmal atrial fibrillation [facility])     Past Surgical History:  Procedure Laterality Date  . ABDOMINAL HYSTERECTOMY    . ABLATION  12-23-2013   PVI and CTI by Dr Rayann Heman  . ATRIAL FIBRILLATION ABLATION N/A 12/23/2013   Procedure: ATRIAL FIBRILLATION ABLATION;  Surgeon: Coralyn Mark, MD;  Location: [place] CATH LAB;  Service: Cardiovascular;  Laterality: N/A;  . AUGMENTATION MAMMAPLASTY    . BREAST ENHANCEMENT SURGERY  83,11  . COSMETIC SURGERY  2003   face  . FLEXIBLE SIGMOIDOSCOPY N/A 07/29/2013   Procedure: FLEXIBLE SIGMOIDOSCOPY;  Surgeon: [name], MD;  [facility] ENDOSCOPY;  Service: Endoscopy;  Laterality: N/A;  . FRACTURE SURGERY Right    wrist  . TEE WITHOUT CARDIOVERSION N/A 12/22/2013   Procedure: TRANSESOPHAGEAL ECHOCARDIOGRAM (TEE);  Surgeon: [name], MD;  Location: [facility] ENDOSCOPY;  Service: Cardiovascular;  Laterality: N/A;  . TONSILLECTOMY    .  TOTAL SHOULDER ARTHROPLASTY Left 04/23/2015   Procedure: LEFT TOTAL SHOULDER ARTHROPLASTY;  Surgeon: Netta Cedars, MD;  Location: [place];  Service: Orthopedics;  Laterality: Left;    Current Outpatient Medications  Medication Sig Dispense Refill  . acetaminophen (TYLENOL) 325 MG tablet Take 650 mg by mouth every 6 (six) hours as needed for mild pain or moderate pain.    . Calcium Carbonate-Vitamin D (CALCIUM-VITAMIN D) 600-125 MG-UNIT TABS Take 1 tablet by mouth 2 (two) times daily.    Marland Kitchen ELIQUIS 5 MG TABS tablet TAKE 1 TABLET BY MOUTH TWICE A DAY 180 tablet 1  . flecainide (TAMBOCOR) 50 MG tablet Take 50 mg by mouth 2 (two) times daily.     Marland Kitchen levothyroxine (SYNTHROID, LEVOTHROID) 50 MCG tablet Take 50 mcg by mouth daily.    . sertraline (ZOLOFT) 100 MG tablet Take 0.5 tablets by mouth daily.  2  . traZODone (DESYREL) 50 MG tablet Take 25-50 mg by mouth at bedtime.      No current facility-administered medications for this visit.     Family History  Problem Relation Age of Onset  . Cancer Mother   . Hypertension Mother   . Thyroid disease Mother   . Cancer Paternal Grandmother   . Breast cancer Paternal Grandmother   . Thyroid disease Sister   . Stroke Maternal Grandmother  ROS:  Pertinent items are noted in HPI.  Otherwise, a comprehensive ROS was negative.  Exam:   BP 100/60 (BP Location: Right Arm, Patient Position: Sitting, Cuff Size: Normal)   Pulse 70   Resp 16   Ht 5\' 3"  (1.6 m)   Wt 108 lb (49 kg)   LMP 08/08/1983 (Approximate)   BMI 19.13 kg/m   Height: 5\' 3"  (160 cm)  Ht Readings from Last 3 Encounters:  09/05/17 5\' 3"  (1.6 m)  01/10/17 5' 2.75" (1.594 m)  05/29/16 5' 2.75" (1.594 m)    General appearance: alert, cooperative and appears stated age Head: Normocephalic, without obvious abnormality, atraumatic Neck: no adenopathy, supple, symmetrical, trachea midline and thyroid normal to inspection and palpation Lungs: clear to auscultation  bilaterally Breasts: normal appearance, no masses or tenderness, bilateral implants Heart: regular rate and rhythm Abdomen: soft, non-tender; bowel sounds normal; no masses,  no organomegaly Extremities: extremities normal, atraumatic, no cyanosis or edema Skin: Skin color, texture, turgor normal. No rashes or lesions Lymph nodes: Cervical, supraclavicular, and axillary nodes normal. No abnormal inguinal nodes palpated Neurologic: Grossly normal   Pelvic: External genitalia:  no lesions              Urethra:  normal appearing urethra with no masses, tenderness or lesions              Bartholins and Skenes: normal                 Vagina: puckering and slight thickening at vaginal apex (appearance similar to cervix after supracervical hysterectomy)              Cervix: absent              Pap taken: No. Bimanual Exam:  Uterus:  uterus absent              Adnexa: no mass, fullness, tenderness               Rectovaginal: Confirms               Anus:  normal sphincter tone, no lesions  Chaperone was present for exam.  A:  Well Woman with normal exam PMP, no HRT H/O A fib on Eliquis Abnormal appearance of vaginal apex (had ultrasound 2014 when Dr. Quincy Simmonds saw similar appearance) Osteoporosis.  Did not tolerate Actonel.  P:   Mammogram guidelines reviewed.  Doing 3D. pap smear obtained today Lab work and vaccines UTD with Dr. Inda Merlin BMD order placed.  Pt will call to schedule. Colonoscopy is UTD. return annually or prn

## 2017-09-06 LAB — CYTOLOGY - PAP: Diagnosis: NEGATIVE

## 2017-09-07 ENCOUNTER — Other Ambulatory Visit: Payer: Self-pay | Admitting: Obstetrics & Gynecology

## 2017-09-07 DIAGNOSIS — Z1231 Encounter for screening mammogram for malignant neoplasm of breast: Secondary | ICD-10-CM

## 2017-09-13 MED FILL — traZODone HCL 50 MG TABS: 50 | 30 days supply | Qty: 30 | Fill #0

## 2017-10-16 MED FILL — SYNTHROID 50 MCG TABLET: 50 | 90 days supply | Qty: 90 | Fill #1

## 2017-10-24 MED FILL — FLECAINIDE ACETATE 50 MG TA: 50 | 90 days supply | Qty: 270 | Fill #2

## 2017-11-02 DIAGNOSIS — L900 Lichen sclerosus et atrophicus: Secondary | ICD-10-CM | POA: Diagnosis not present

## 2017-11-02 DIAGNOSIS — L308 Other specified dermatitis: Secondary | ICD-10-CM | POA: Diagnosis not present

## 2017-11-02 DIAGNOSIS — L570 Actinic keratosis: Secondary | ICD-10-CM | POA: Diagnosis not present

## 2017-11-02 DIAGNOSIS — L814 Other melanin hyperpigmentation: Secondary | ICD-10-CM | POA: Diagnosis not present

## 2017-11-02 DIAGNOSIS — C44519 Basal cell carcinoma of skin of other part of trunk: Secondary | ICD-10-CM | POA: Diagnosis not present

## 2017-11-02 DIAGNOSIS — B078 Other viral warts: Secondary | ICD-10-CM | POA: Diagnosis not present

## 2017-11-02 MED FILL — BETAMETHASONE DP 0.05% CRM: 0.05 | 15 days supply | Qty: 15 | Fill #0

## 2017-11-02 MED FILL — MUPIROCIN 2% OINTMENT: 2 | 7 days supply | Qty: 22 | Fill #0

## 2017-11-02 MED FILL — TRETINOIN 0.05% CREAM: 0.05 | 30 days supply | Qty: 20 | Fill #0

## 2017-11-15 DIAGNOSIS — C44519 Basal cell carcinoma of skin of other part of trunk: Secondary | ICD-10-CM | POA: Diagnosis not present

## 2017-11-22 MED FILL — traZODone HCL 50 MG TABS: 50 | 30 days supply | Qty: 30 | Fill #1

## 2017-12-03 ENCOUNTER — Other Ambulatory Visit: Payer: Self-pay | Admitting: Internal Medicine

## 2017-12-03 MED FILL — ELIQUIS 5 MG TABLET: 5 | 90 days supply | Qty: 180 | Fill #0

## 2017-12-03 NOTE — Telephone Encounter (Signed)
Pt is a 67 yr old female who saw Roderic Palau, NP on 06/04/17 for Dr Rayann Heman. Her last noted wt on 09/05/17 was 49Kg. 02/06/17 serum creatine was 0.75 from PCP. Will refill her Eliquis 5mg  BID.

## 2017-12-04 ENCOUNTER — Ambulatory Visit (HOSPITAL_COMMUNITY)
Admission: RE | Admit: 2017-12-04 | Discharge: 2017-12-04 | Disposition: A | Discharge status: Home / Self Care | Payer: PPO | Source: Ambulatory Visit | Attending: Nurse Practitioner | Admitting: Nurse Practitioner

## 2017-12-04 ENCOUNTER — Encounter (HOSPITAL_COMMUNITY): Payer: Self-pay | Admitting: Nurse Practitioner

## 2017-12-04 VITALS — BP 108/62 | HR 65 | Ht 63.0 in | Wt 108.0 lb

## 2017-12-04 DIAGNOSIS — Z7901 Long term (current) use of anticoagulants: Secondary | ICD-10-CM | POA: Diagnosis not present

## 2017-12-04 DIAGNOSIS — M810 Age-related osteoporosis without current pathological fracture: Secondary | ICD-10-CM | POA: Diagnosis not present

## 2017-12-04 DIAGNOSIS — Z79890 Hormone replacement therapy (postmenopausal): Secondary | ICD-10-CM | POA: Insufficient documentation

## 2017-12-04 DIAGNOSIS — Z90710 Acquired absence of both cervix and uterus: Secondary | ICD-10-CM | POA: Diagnosis not present

## 2017-12-04 DIAGNOSIS — F329 Major depressive disorder, single episode, unspecified: Secondary | ICD-10-CM | POA: Diagnosis not present

## 2017-12-04 DIAGNOSIS — Z79899 Other long term (current) drug therapy: Secondary | ICD-10-CM | POA: Insufficient documentation

## 2017-12-04 DIAGNOSIS — Z8349 Family history of other endocrine, nutritional and metabolic diseases: Secondary | ICD-10-CM | POA: Insufficient documentation

## 2017-12-04 DIAGNOSIS — Z8249 Family history of ischemic heart disease and other diseases of the circulatory system: Secondary | ICD-10-CM | POA: Diagnosis not present

## 2017-12-04 DIAGNOSIS — I480 Paroxysmal atrial fibrillation: Secondary | ICD-10-CM | POA: Diagnosis not present

## 2017-12-04 DIAGNOSIS — I4891 Unspecified atrial fibrillation: Secondary | ICD-10-CM | POA: Diagnosis present

## 2017-12-04 DIAGNOSIS — Z96612 Presence of left artificial shoulder joint: Secondary | ICD-10-CM | POA: Insufficient documentation

## 2017-12-04 DIAGNOSIS — E039 Hypothyroidism, unspecified: Secondary | ICD-10-CM | POA: Insufficient documentation

## 2017-12-04 NOTE — Progress Notes (Signed)
Primary Care Physician: Josetta Huddle, MD Referring Physician: Dr. Mikal Plane is a 67 y.o. female with a h/o afib, s/p ablation, on flecainide 50 mg bid. She was seen  Summer 2018 with episodes of afib and was going to [place] so flecainide was increased to 75 mg bid. She did not have any issues in [place] so decreased the dose back to 50 mg bid.   She is in the afib clinic today and is doing well.  Has noted some palpitations right after exercise but is gone in 30 secs. It is not bothering her enough to adjust flecainide dose. Continues on Eliquis 5 mg bid for chadsvasc score of at least 2. No bleeding issues.  Today, she denies symptoms of  chest pain, shortness of breath, orthopnea, PND, lower extremity edema, dizziness, presyncope, syncope, or neurologic sequela. The patient is tolerating medications without difficulties and is otherwise without complaint today.   Past Medical History:  Diagnosis Date  . Arthritis    lt shoulder  . Atrial flutter ([place])   . Depression   . Dyspareunia   . Dysrhythmia    a fib  . Fibroid   . Heart murmur   . Hypothyroidism   . Osteoporosis May, 2017  . Paroxysmal atrial fibrillation [facility])    Past Surgical History:  Procedure Laterality Date  . ABDOMINAL HYSTERECTOMY    . ABLATION  12-23-2013   PVI and CTI by Dr Rayann Heman  . ATRIAL FIBRILLATION ABLATION N/A 12/23/2013   Procedure: ATRIAL FIBRILLATION ABLATION;  Surgeon: Coralyn Mark, MD;  Location: [place] CATH LAB;  Service: Cardiovascular;  Laterality: N/A;  . AUGMENTATION MAMMAPLASTY    . BREAST ENHANCEMENT SURGERY  83,11  . COSMETIC SURGERY  2003   face  . FLEXIBLE SIGMOIDOSCOPY N/A 07/29/2013   Procedure: FLEXIBLE SIGMOIDOSCOPY;  Surgeon: [name], MD;  Location: [facility] ENDOSCOPY;  Service: Endoscopy;  Laterality: N/A;  . FRACTURE SURGERY Right    wrist  . TEE WITHOUT CARDIOVERSION N/A 12/22/2013   Procedure: TRANSESOPHAGEAL ECHOCARDIOGRAM (TEE);  Surgeon: [name],  MD;  Location: [facility] ENDOSCOPY;  Service: Cardiovascular;  Laterality: N/A;  . TONSILLECTOMY    . TOTAL SHOULDER ARTHROPLASTY Left 04/23/2015   Procedure: LEFT TOTAL SHOULDER ARTHROPLASTY;  Surgeon: Netta Cedars, MD;  Location: [place];  Service: Orthopedics;  Laterality: Left;    Current Outpatient Medications  Medication Sig Dispense Refill  . acetaminophen (TYLENOL) 325 MG tablet Take 650 mg by mouth every 6 (six) hours as needed for mild pain or moderate pain.    . Calcium Carbonate-Vitamin D (CALCIUM-VITAMIN D) 600-125 MG-UNIT TABS Take 1 tablet by mouth 2 (two) times daily.    Marland Kitchen ELIQUIS 5 MG TABS tablet TAKE 1 TABLET BY MOUTH TWICE A DAY 180 tablet 0  . flecainide (TAMBOCOR) 50 MG tablet Take 50 mg by mouth 2 (two) times daily.     Marland Kitchen levothyroxine (SYNTHROID, LEVOTHROID) 50 MCG tablet Take 50 mcg by mouth daily.    . sertraline (ZOLOFT) 100 MG tablet Take 0.5 tablets by mouth daily.  2  . traZODone (DESYREL) 50 MG tablet Take 25-50 mg by mouth at bedtime.      No current facility-administered medications for this encounter.     No Known Allergies  Social History   Socioeconomic History  . Marital status: Married    Spouse name: Not on file  . Number of children: Not on file  . Years of education: Not on file  .  Highest education level: Not on file  Occupational History  . Not on file  Social Needs  . Financial resource strain: Not on file  . Food insecurity:    Worry: Not on file    Inability: Not on file  . Transportation needs:    Medical: Not on file    Non-medical: Not on file  Tobacco Use  . Smoking status: Never Smoker  . Smokeless tobacco: Never Used  Substance and Sexual Activity  . Alcohol use: Yes    Alcohol/week: 1.8 oz    Types: 3 Glasses of wine per week    Comment: 5 glasses of wine per week  . Drug use: No  . Sexual activity: Not Currently    Partners: Male    Birth control/protection: None, Surgical    Comment: TVH--still has ovaries  Lifestyle  .  Physical activity:    Days per week: Not on file    Minutes per session: Not on file  . Stress: Not on file  Relationships  . Social connections:    Talks on phone: Not on file    Gets together: Not on file    Attends religious service: Not on file    Active member of club or organization: Not on file    Attends meetings of clubs or organizations: Not on file    Relationship status: Not on file  . Intimate partner violence:    Fear of current or ex partner: Not on file    Emotionally abused: Not on file    Physically abused: Not on file    Forced sexual activity: Not on file  Other Topics Concern  . Not on file  Social History Narrative   Pt lives in [place] with spouse.  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    Family History  Problem Relation Age of Onset  . Cancer Mother   . Hypertension Mother   . Thyroid disease Mother   . Cancer Paternal Grandmother   . Breast cancer Paternal Grandmother   . Thyroid disease Sister   . Stroke Maternal Grandmother     ROS- All systems are reviewed and negative except as per the HPI above  Physical Exam: Vitals:   12/04/17 1100  BP: 108/62  Pulse: 65  Weight: 108 lb (49 kg)  Height: 5\' 3"  (1.6 m)   Wt Readings from Last 3 Encounters:  12/04/17 108 lb (49 kg)  09/05/17 108 lb (49 kg)  06/04/17 107 lb (48.5 kg)    Labs: Lab Results  Component Value Date   NA 137 12/23/2015   K 4.0 12/23/2015   CL 99 12/23/2015   CO2 27 12/23/2015   GLUCOSE 111 (H) 12/23/2015   BUN 20 12/23/2015   CREATININE 0.66 12/23/2015   CALCIUM 9.0 08/11/2016   Lab Results  Component Value Date   INR 1.03 04/14/2015   Lab Results  Component Value Date   CHOL 250 (H) 09/22/2013   HDL 110.80 09/22/2013   TRIG 59.0 09/22/2013     GEN- The patient is well appearing, alert and oriented x 3 today.   Head- normocephalic, atraumatic Eyes-  Sclera clear, conjunctiva pink Ears- hearing intact Oropharynx- clear Neck- supple, no  JVP Lymph- no cervical lymphadenopathy Lungs- Clear to ausculation bilaterally, normal work of breathing Heart- Regular rate and rhythm, no murmurs, rubs or gallops, PMI not laterally displaced GI- soft, NT, ND, + BS Extremities- no clubbing, cyanosis, or edema MS- no significant deformity or atrophy Skin- no rash  or lesion Psych- euthymic mood, full affect Neuro- strength and sensation are intact  EKG- NSR at 65 bpm, pr int 196 ms, qrs int 86 ms, qtc 416 ms Epic records reviewed    Assessment and Plan: 1. Afib S/p ablation 2015 with quiet afib No rate control on board due to low blood pressure  Continue flecainide 50  mg bid  Continue eliquis 5 mg bid for chadsvasc score of at least 2, appropriately dosed, last creatinine 0.75 in July 2018, is scheduled for physical/ labs this summer  Return in 6 months  Butch Penny C. Raychell Holcomb, [facility] [address] [place], [place] [address] [phone]

## 2018-01-02 MED FILL — traZODone HCL 50 MG TABS: 50 | 30 days supply | Qty: 30 | Fill #2

## 2018-01-15 MED FILL — SYNTHROID 50 MCG TABLET: 50 | 90 days supply | Qty: 90 | Fill #2

## 2018-02-14 ENCOUNTER — Ambulatory Visit
Admission: RE | Admit: 2018-02-14 | Discharge: 2018-02-14 | Disposition: A | Discharge status: Home / Self Care | Payer: PPO | Source: Ambulatory Visit | Attending: Obstetrics & Gynecology | Admitting: Obstetrics & Gynecology

## 2018-02-14 DIAGNOSIS — Z1231 Encounter for screening mammogram for malignant neoplasm of breast: Secondary | ICD-10-CM | POA: Diagnosis not present

## 2018-02-14 DIAGNOSIS — Z780 Asymptomatic menopausal state: Secondary | ICD-10-CM | POA: Diagnosis not present

## 2018-02-14 DIAGNOSIS — M8588 Other specified disorders of bone density and structure, other site: Secondary | ICD-10-CM | POA: Diagnosis not present

## 2018-02-14 DIAGNOSIS — M816 Localized osteoporosis [Lequesne]: Secondary | ICD-10-CM

## 2018-02-14 DIAGNOSIS — M810 Age-related osteoporosis without current pathological fracture: Secondary | ICD-10-CM | POA: Diagnosis not present

## 2018-02-25 MED FILL — traZODone HCL 50 MG TABS: 50 | 30 days supply | Qty: 30 | Fill #3

## 2018-02-26 ENCOUNTER — Other Ambulatory Visit (HOSPITAL_COMMUNITY): Payer: Self-pay | Admitting: Nurse Practitioner

## 2018-02-26 ENCOUNTER — Other Ambulatory Visit: Payer: Self-pay | Admitting: Internal Medicine

## 2018-02-26 ENCOUNTER — Telehealth: Payer: Self-pay | Admitting: Obstetrics & Gynecology

## 2018-02-26 DIAGNOSIS — G4730 Sleep apnea, unspecified: Secondary | ICD-10-CM | POA: Diagnosis not present

## 2018-02-26 DIAGNOSIS — K589 Irritable bowel syndrome without diarrhea: Secondary | ICD-10-CM | POA: Diagnosis not present

## 2018-02-26 DIAGNOSIS — E559 Vitamin D deficiency, unspecified: Secondary | ICD-10-CM | POA: Diagnosis not present

## 2018-02-26 DIAGNOSIS — Z79899 Other long term (current) drug therapy: Secondary | ICD-10-CM | POA: Diagnosis not present

## 2018-02-26 DIAGNOSIS — L659 Nonscarring hair loss, unspecified: Secondary | ICD-10-CM | POA: Diagnosis not present

## 2018-02-26 DIAGNOSIS — I4891 Unspecified atrial fibrillation: Secondary | ICD-10-CM | POA: Diagnosis not present

## 2018-02-26 DIAGNOSIS — F325 Major depressive disorder, single episode, in full remission: Secondary | ICD-10-CM | POA: Diagnosis not present

## 2018-02-26 DIAGNOSIS — E039 Hypothyroidism, unspecified: Secondary | ICD-10-CM | POA: Diagnosis not present

## 2018-02-26 DIAGNOSIS — Z0001 Encounter for general adult medical examination with abnormal findings: Secondary | ICD-10-CM | POA: Diagnosis not present

## 2018-02-26 DIAGNOSIS — R634 Abnormal weight loss: Secondary | ICD-10-CM | POA: Diagnosis not present

## 2018-02-26 DIAGNOSIS — M810 Age-related osteoporosis without current pathological fracture: Secondary | ICD-10-CM | POA: Diagnosis not present

## 2018-02-26 MED FILL — SERTRALINE HCL 100 MG TAB: 100 | 90 days supply | Qty: 135 | Fill #1

## 2018-02-26 MED FILL — ELIQUIS 5 MG TABLET: 5 | 90 days supply | Qty: 180 | Fill #0

## 2018-02-26 MED FILL — FLECAINIDE ACETATE 50 MG TA: 50 | 90 days supply | Qty: 180 | Fill #0

## 2018-02-26 NOTE — Telephone Encounter (Signed)
Spoke with patient, requesting BMD results DOS 02/14/18, [facility].   Advised will have Dr. Sabra Heck review, will return call with recommendations.  Dr. Assunta Curtis review and advise.

## 2018-02-26 NOTE — Telephone Encounter (Signed)
Pt last saw Terri [name] 12/04/17, last labs 02/06/17 Creat 0.75, pt is overdue for follow-up labwork.  Pt has appt with Terri [name] in October, placed note on appt to draw BMP and CBC f/u Eliquis bloodwork.  Age 67, weight 49kg, based on specified criteria pt is on appropriate dosage of Eliquis 5mg  BID.  Will refill rx and await more recent labwork results in Oct.

## 2018-02-26 NOTE — Telephone Encounter (Signed)
Patient would like an appointment with Dr Sabra Heck to discuss her bone density results.

## 2018-03-01 NOTE — Telephone Encounter (Signed)
Result message sent to you.

## 2018-03-01 NOTE — Telephone Encounter (Signed)
Spoke with patient, advised as seen below per Dr. Sabra Heck.   Patient states she was seen by PCP this week, has hx of Afib and stopped estrogen when she started eliquis. Patient states PCP recommended she restart estrogen, patient would like to discuss with Dr. Sabra Heck. OV scheduled for 03/14/18 at 1:45pm.  Routing to provider for final review. Patient is agreeable to disposition. Will close encounter.

## 2018-03-01 NOTE — Telephone Encounter (Signed)
-----   Message from Megan Salon, MD sent at 03/01/2018  9:40 AM EDT ----- Please let her know that her BMD has very minimally changed in the hip and spine.  There is very mild osteopenia in her spine and very early osteoporosis in her hip.  I am ok to keep watching this and repeat in 2 years.  If she has lots of questions, ok to have OV for consult.

## 2018-03-12 ENCOUNTER — Ambulatory Visit (INDEPENDENT_AMBULATORY_CARE_PROVIDER_SITE_OTHER): Payer: PPO | Admitting: Obstetrics & Gynecology

## 2018-03-12 ENCOUNTER — Encounter: Payer: Self-pay | Admitting: Obstetrics & Gynecology

## 2018-03-12 VITALS — BP 88/62 | HR 64 | Resp 16 | Ht 63.0 in | Wt 107.0 lb

## 2018-03-12 DIAGNOSIS — M816 Localized osteoporosis [Lequesne]: Secondary | ICD-10-CM

## 2018-03-12 NOTE — Progress Notes (Signed)
Subjective:    60 yrs Married Caucasian G2P2002  female here to discuss recent BMD obtained 02/14/18 showing osteoporosis.  T score -2.6.  Prior BMD was -2.5 in 2017.  This is not statistically significant but does continue to show a trend and I think we needed to discuss results as well as discuss a plan going forward.  She really does not want to be on medications at this time.  Did try Actonel about two years ago and had a significant skin reaction and stopped it at that time.    Osteoporosis Risk Factors  Nonmodifiable Personal Hx of fracture as an adult: yes, broken wrist about 10 years ago and this was from trauma Hx of fracture in first-degree relative: no Caucasian race: yes Advanced age: no Female sex: yes Dementia: no Poor health/frailty: no  Potentially modifiable: Tobacco use: no Low body weight (<127 lbs): yes Estrogen deficiency  early menopause (age <45) or bilateral ovariectomy: no  prolonged premenopausal amenorrhea (>1 yr): no Low calcium intake (lifelong): no Alcohol use more than 2 drinks per day: no Recurrent falls: no Inadequate physical activity: no.  Exercise 6 days a week.  Does cardio 4 times a week.  Weights two times weekly.  Does machines and free weights  Current calcium and Vit D intake: 1000 calcium and Vit D 3 4000IU Review of Systems A comprehensive review of systems was negative.     Objective:   PHYSICAL EXAM BP (!) 88/62 (BP Location: Right Arm, Patient Position: Sitting, Cuff Size: Normal)   Pulse 64   Resp 16   Ht 5\' 3"  (1.6 m)   Wt 107 lb (48.5 kg)   LMP 08/08/1983 (Approximate)   BMI 18.95 kg/m  General appearance: alert, cooperative and no distress  Imaging: Bone Density: Spine T Score: -1.1, Hip T Score: -2.6 obtained on 02/14/18 FRAX score:  Not calculated due to osteoporosis            Discussion: Findings reviewed.  Comparison to prior BMD made.  Pt's major issues/risks are out of her control--ethnicity, bone and weight  structure, lighter hair and eye colors.  She is taking adequate calcium and Vit D.  She is active.  Is considering a bone strengthening program called Vladimir Faster which uses a machine to increase the weight bearing load on the bones.  Is about 10-15 minutes weekly and costs about $80/month.  I do not have another pt doing this currently (to the best of my knowledge).  I reviewed literature she brought.  It this is worth her time and energy and the cost, esp if it keeps her from being on a medication.  Questions answered.                      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  Patient counseled in adequate calcium and vitamin D exposure.  She is likely taking too much Vit D so will decrease from 4000 IU to at least 3000 or 2000 IU daily. 2.  Continue exercise.  Adding the Osteostrong bone strengthening 3.  Recommendation to avoid heavy ETOH use.  4.  Counseled to avoid tobacco and second had smoke. 5.  Fall prevention discussed. 6.  Will not start treatment at this time. 7.  Repeat bone density in 2 years.  ~20 minutes spent with patient >50% of time was in face to  face discussion of above.

## 2018-03-14 ENCOUNTER — Ambulatory Visit: Payer: Self-pay | Admitting: Obstetrics & Gynecology

## 2018-03-15 DIAGNOSIS — E039 Hypothyroidism, unspecified: Secondary | ICD-10-CM | POA: Insufficient documentation

## 2018-03-15 DIAGNOSIS — G4730 Sleep apnea, unspecified: Secondary | ICD-10-CM | POA: Insufficient documentation

## 2018-03-21 DIAGNOSIS — Z85828 Personal history of other malignant neoplasm of skin: Secondary | ICD-10-CM | POA: Diagnosis not present

## 2018-03-21 DIAGNOSIS — L821 Other seborrheic keratosis: Secondary | ICD-10-CM | POA: Diagnosis not present

## 2018-04-15 MED FILL — traZODone HCL 50 MG TABS: 50 | 30 days supply | Qty: 30 | Fill #4

## 2018-04-15 MED FILL — SYNTHROID 50 MCG TABLET: 50 | 90 days supply | Qty: 90 | Fill #3

## 2018-05-20 ENCOUNTER — Other Ambulatory Visit: Payer: Self-pay | Admitting: Internal Medicine

## 2018-05-20 MED FILL — FLECAINIDE ACETATE 50 MG TA: 50 | 90 days supply | Qty: 180 | Fill #1

## 2018-05-21 MED FILL — ELIQUIS 5 MG TABLET: 5 | 90 days supply | Qty: 180 | Fill #0

## 2018-05-21 NOTE — Telephone Encounter (Signed)
Pt is a 67 yr old female was saw Afib clinic on 12/04/17-Dr. Allred. Weight on 03/12/18 was 48.5Kg. Last SCr on 02/26/18 was 0.72. Will refill Eliquis 5mg  BID.

## 2018-06-04 ENCOUNTER — Ambulatory Visit (HOSPITAL_COMMUNITY)
Admission: RE | Admit: 2018-06-04 | Discharge: 2018-06-04 | Disposition: A | Discharge status: Home / Self Care | Payer: PPO | Source: Ambulatory Visit | Attending: Nurse Practitioner | Admitting: Nurse Practitioner

## 2018-06-04 ENCOUNTER — Encounter (HOSPITAL_COMMUNITY): Payer: Self-pay | Admitting: Nurse Practitioner

## 2018-06-04 VITALS — BP 94/64 | HR 66 | Ht 63.0 in | Wt 110.0 lb

## 2018-06-04 DIAGNOSIS — R079 Chest pain, unspecified: Secondary | ICD-10-CM | POA: Insufficient documentation

## 2018-06-04 DIAGNOSIS — F329 Major depressive disorder, single episode, unspecified: Secondary | ICD-10-CM | POA: Insufficient documentation

## 2018-06-04 DIAGNOSIS — I480 Paroxysmal atrial fibrillation: Secondary | ICD-10-CM

## 2018-06-04 DIAGNOSIS — M810 Age-related osteoporosis without current pathological fracture: Secondary | ICD-10-CM | POA: Insufficient documentation

## 2018-06-04 DIAGNOSIS — Z79899 Other long term (current) drug therapy: Secondary | ICD-10-CM | POA: Insufficient documentation

## 2018-06-04 DIAGNOSIS — Z79890 Hormone replacement therapy (postmenopausal): Secondary | ICD-10-CM | POA: Insufficient documentation

## 2018-06-04 DIAGNOSIS — I4892 Unspecified atrial flutter: Secondary | ICD-10-CM | POA: Insufficient documentation

## 2018-06-04 DIAGNOSIS — E039 Hypothyroidism, unspecified: Secondary | ICD-10-CM | POA: Insufficient documentation

## 2018-06-04 DIAGNOSIS — Z7901 Long term (current) use of anticoagulants: Secondary | ICD-10-CM | POA: Insufficient documentation

## 2018-06-04 DIAGNOSIS — R918 Other nonspecific abnormal finding of lung field: Secondary | ICD-10-CM | POA: Insufficient documentation

## 2018-06-04 DIAGNOSIS — M1990 Unspecified osteoarthritis, unspecified site: Secondary | ICD-10-CM | POA: Insufficient documentation

## 2018-06-04 LAB — BASIC METABOLIC PANEL
Anion gap: 7 (ref 5–15)
BUN: 16 mg/dL (ref 8–23)
CHLORIDE: 100 mmol/L (ref 98–111)
CO2: 28 mmol/L (ref 22–32)
CREATININE: 0.75 mg/dL (ref 0.44–1.00)
Calcium: 9.5 mg/dL (ref 8.9–10.3)
GFR calc Af Amer: >60 mL/min (ref >60)
GFR calc non Af Amer: >60 mL/min (ref >60)
Glucose, Bld: 124 mg/dL — ABNORMAL HIGH (ref 70–99)
POTASSIUM: 3.9 mmol/L (ref 3.5–5.1)
SODIUM: 135 mmol/L (ref 135–145)

## 2018-06-04 LAB — CBC
HEMATOCRIT: 39.7 % (ref 36.0–46.0)
HEMOGLOBIN: 12.7 g/dL (ref 12.0–15.0)
MCH: 30.7 pg (ref 26.0–34.0)
MCHC: 32 g/dL (ref 30.0–36.0)
MCV: 95.9 fL (ref 80.0–100.0)
Platelets: 220 10*3/uL (ref 150–400)
RBC: 4.14 MIL/uL (ref 3.87–5.11)
RDW: 12.9 % (ref 11.5–15.5)
WBC: 8 10*3/uL (ref 4.0–10.5)
nRBC: 0 % (ref 0.0–0.2)

## 2018-06-04 NOTE — Patient Instructions (Signed)
Scheduling will call to set up stress testing.

## 2018-06-04 NOTE — Progress Notes (Signed)
Primary Care Physician: Josetta Huddle, MD Referring Physician: Dr. Mikal Plane is a 67 y.o. female with a h/o afib, s/p ablation, on flecainide 50 mg bid. She was seen  Summer 2018 with episodes of afib and was going to [place] so flecainide was increased to 75 mg bid. She did not have any issues in [place] so decreased the dose back to 50 mg bid.   She is in the afib clinic today, 06/04/18, for f/u.She has not noted any afib, doing well on flecainide 50 mg bid. She however, has noted some mid chest discomfort, since August after a bout of bronchitis. She feels this mid chest with radiation into her rt shoulder and upper arm. This can be at rest or exertion. Can last 5 minutes or go on for a couple of hours. She denies any relation to eating. No cough, intermittent fever.   Today, she denies symptoms of  chest pain, shortness of breath, orthopnea, PND, lower extremity edema, dizziness, presyncope, syncope, or neurologic sequela. The patient is tolerating medications without difficulties and is otherwise without complaint today.   Past Medical History:  Diagnosis Date  . Arthritis    lt shoulder  . Atrial flutter ([place])   . Depression   . Dyspareunia   . Dysrhythmia    a fib  . Fibroid   . Heart murmur   . Hypothyroidism   . Osteoporosis May, 2017  . Paroxysmal atrial fibrillation [facility])    Past Surgical History:  Procedure Laterality Date  . ABDOMINAL HYSTERECTOMY    . ABLATION  12-23-2013   PVI and CTI by Dr Rayann Heman  . ATRIAL FIBRILLATION ABLATION N/A 12/23/2013   Procedure: ATRIAL FIBRILLATION ABLATION;  Surgeon: Coralyn Mark, MD;  Location: [place] CATH LAB;  Service: Cardiovascular;  Laterality: N/A;  . AUGMENTATION MAMMAPLASTY Bilateral   . BREAST ENHANCEMENT SURGERY  83,11  . COSMETIC SURGERY  2003   face  . FLEXIBLE SIGMOIDOSCOPY N/A 07/29/2013   Procedure: FLEXIBLE SIGMOIDOSCOPY;  Surgeon: [name], MD;  Location: [facility] ENDOSCOPY;  Service: Endoscopy;   Laterality: N/A;  . FRACTURE SURGERY Right    wrist  . TEE WITHOUT CARDIOVERSION N/A 12/22/2013   Procedure: TRANSESOPHAGEAL ECHOCARDIOGRAM (TEE);  Surgeon: [name], MD;  Location: [facility] ENDOSCOPY;  Service: Cardiovascular;  Laterality: N/A;  . TONSILLECTOMY    . TOTAL SHOULDER ARTHROPLASTY Left 04/23/2015   Procedure: LEFT TOTAL SHOULDER ARTHROPLASTY;  Surgeon: Netta Cedars, MD;  Location: [place];  Service: Orthopedics;  Laterality: Left;    Current Outpatient Medications  Medication Sig Dispense Refill  . acetaminophen (TYLENOL) 325 MG tablet Take 650 mg by mouth every 6 (six) hours as needed for mild pain or moderate pain.    . Calcium Carbonate-Vitamin D (CALCIUM-VITAMIN D) 600-125 MG-UNIT TABS Take 1 tablet by mouth 2 (two) times daily.    Marland Kitchen ELIQUIS 5 MG TABS tablet TAKE 1 TABLET BY MOUTH TWICE A DAY 180 tablet 3  . flecainide (TAMBOCOR) 50 MG tablet Take 50 mg by mouth 2 (two) times daily.     Marland Kitchen levothyroxine (SYNTHROID, LEVOTHROID) 50 MCG tablet Take 50 mcg by mouth daily.    . sertraline (ZOLOFT) 100 MG tablet Take 0.5 tablets by mouth daily.  2  . traZODone (DESYREL) 50 MG tablet Take 25-50 mg by mouth at bedtime.      No current facility-administered medications for this encounter.     No Known Allergies  Social History   Socioeconomic History  .  Marital status: Married    Spouse name: Not on file  . Number of children: Not on file  . Years of education: Not on file  . Highest education level: Not on file  Occupational History  . Not on file  Social Needs  . Financial resource strain: Not on file  . Food insecurity:    Worry: Not on file    Inability: Not on file  . Transportation needs:    Medical: Not on file    Non-medical: Not on file  Tobacco Use  . Smoking status: Never Smoker  . Smokeless tobacco: Never Used  Substance and Sexual Activity  . Alcohol use: Yes    Alcohol/week: 3.0 standard drinks    Types: 3 Glasses of wine per week    Comment: 5  glasses of wine per week  . Drug use: No  . Sexual activity: Not Currently    Partners: Male    Birth control/protection: None, Surgical    Comment: TVH--still has ovaries  Lifestyle  . Physical activity:    Days per week: Not on file    Minutes per session: Not on file  . Stress: Not on file  Relationships  . Social connections:    Talks on phone: Not on file    Gets together: Not on file    Attends religious service: Not on file    Active member of club or organization: Not on file    Attends meetings of clubs or organizations: Not on file    Relationship status: Not on file  . Intimate partner violence:    Fear of current or ex partner: Not on file    Emotionally abused: Not on file    Physically abused: Not on file    Forced sexual activity: Not on file  Other Topics Concern  . Not on file  Social History Narrative   Pt lives in [place] with spouse.  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    Family History  Problem Relation Age of Onset  . Cancer Mother   . Hypertension Mother   . Thyroid disease Mother   . Cancer Paternal Grandmother 24  . AAA (abdominal aortic aneurysm) Paternal Grandmother   . Thyroid disease Sister   . Stroke Maternal Grandmother     ROS- All systems are reviewed and negative except as per the HPI above  Physical Exam: Vitals:   06/04/18 1128  BP: 94/64  Pulse: 66  Weight: 49.9 kg  Height: 5\' 3"  (1.6 m)   Wt Readings from Last 3 Encounters:  06/04/18 49.9 kg  03/12/18 48.5 kg  12/04/17 49 kg    Labs: Lab Results  Component Value Date   NA 137 12/23/2015   K 4.0 12/23/2015   CL 99 12/23/2015   CO2 27 12/23/2015   GLUCOSE 111 (H) 12/23/2015   BUN 20 12/23/2015   CREATININE 0.66 12/23/2015   CALCIUM 9.0 08/11/2016   Lab Results  Component Value Date   INR 1.03 04/14/2015   Lab Results  Component Value Date   CHOL 250 (H) 09/22/2013   HDL 110.80 09/22/2013   TRIG 59.0 09/22/2013     GEN- The patient is well  appearing, alert and oriented x 3 today.   Head- normocephalic, atraumatic Eyes-  Sclera clear, conjunctiva pink Ears- hearing intact Oropharynx- clear Neck- supple, no JVP Lymph- no cervical lymphadenopathy Lungs- Clear to ausculation bilaterally, normal work of breathing Heart- Regular rate and rhythm, no murmurs, rubs or gallops, PMI  not laterally displaced GI- soft, NT, ND, + BS Extremities- no clubbing, cyanosis, or edema MS- no significant deformity or atrophy Skin- no rash or lesion Psych- euthymic mood, full affect Neuro- strength and sensation are intact  EKG- NSR at 66 bpm, pr int 200 ms, qrs int 90 ms, qtc 413 ms Epic records reviewed    Assessment and Plan: 1. Afib S/p ablation 2015 with quiet afib No rate control on board due to low blood pressure  Continue flecainide 50  mg bid  Continue eliquis 5 mg bid  Bmet/cbc pending  2. 3 month h/o mid chest discomfort with radiation to rt shoulder Occurs with rest and exertion CXR today Echo  Treadmill/myoview Will call reports to pt  Return in 6 months  Butch Penny C. Taraoluwa Thakur, [facility] [address] [place], [place] [address] [phone]

## 2018-06-05 ENCOUNTER — Encounter (HOSPITAL_COMMUNITY): Payer: Self-pay | Admitting: *Deleted

## 2018-06-11 ENCOUNTER — Telehealth: Payer: Self-pay | Admitting: *Deleted

## 2018-06-11 NOTE — Telephone Encounter (Signed)
Left message on voicemail per DPR in reference to upcoming appointment scheduled on 06/14/18 at 0730 with detailed instructions given per Myocardial Perfusion Study Information Sheet for the test. LM to arrive 15 minutes early, and that it is imperative to arrive on time for appointment to keep from having the test rescheduled. If you need to cancel or reschedule your appointment, please call the office within 24 hours of your appointment. Failure to do so may result in a cancellation of your appointment, and a $50 no show fee. Phone number given for call back for any questions. Asher Torpey, [place] [place]

## 2018-06-13 ENCOUNTER — Encounter (HOSPITAL_COMMUNITY): Payer: PPO

## 2018-06-14 ENCOUNTER — Ambulatory Visit (HOSPITAL_COMMUNITY): Payer: PPO | Attending: Cardiology

## 2018-06-14 DIAGNOSIS — I480 Paroxysmal atrial fibrillation: Secondary | ICD-10-CM | POA: Insufficient documentation

## 2018-06-14 LAB — MYOCARDIAL PERFUSION IMAGING
CHL CUP NUCLEAR SRS: 5
CHL CUP NUCLEAR SSS: 10
CHL CUP RESTING HR STRESS: 70 {beats}/min
CSEPED: 10 min
CSEPEW: 12.5 METS
CSEPHR: 93 %
Exercise duration (sec): 30 s
LV dias vol: 48 mL (ref 46–106)
LV sys vol: 14 mL
MPHR: 151 {beats}/min
Peak HR: 141 {beats}/min
SDS: 5
TID: 1.02

## 2018-06-14 MED ORDER — TECHNETIUM TC 99M TETROFOSMIN IV KIT
31.7000 | PACK | Freq: Once | INTRAVENOUS | Status: AC | PRN
  Administered 2018-06-14: 31.7 via INTRAVENOUS
  Filled 2018-06-14: qty 32

## 2018-06-14 MED ORDER — TECHNETIUM TC 99M TETROFOSMIN IV KIT
10.2000 | PACK | Freq: Once | INTRAVENOUS | Status: AC | PRN
  Administered 2018-06-14: 10.2 via INTRAVENOUS
  Filled 2018-06-14: qty 11

## 2018-06-17 ENCOUNTER — Encounter (HOSPITAL_COMMUNITY): Payer: Self-pay | Admitting: *Deleted

## 2018-06-20 ENCOUNTER — Ambulatory Visit (HOSPITAL_COMMUNITY): Payer: PPO

## 2018-07-01 MED FILL — traZODone HCL 50 MG TABS: 50 | 90 days supply | Qty: 90 | Fill #0

## 2018-07-16 MED FILL — SYNTHROID 50 MCG TABLET: 50 | 90 days supply | Qty: 90 | Fill #0

## 2018-07-18 ENCOUNTER — Encounter

## 2018-07-29 DIAGNOSIS — L308 Other specified dermatitis: Secondary | ICD-10-CM | POA: Diagnosis not present

## 2018-07-29 DIAGNOSIS — L300 Nummular dermatitis: Secondary | ICD-10-CM | POA: Diagnosis not present

## 2018-08-30 MED FILL — ELIQUIS 5 MG TABLET: 5 | 90 days supply | Qty: 180 | Fill #1

## 2018-08-30 MED FILL — FLECAINIDE ACETATE 50 MG TA: 50 | 90 days supply | Qty: 180 | Fill #2

## 2018-09-02 DIAGNOSIS — H26493 Other secondary cataract, bilateral: Secondary | ICD-10-CM | POA: Diagnosis not present

## 2018-09-12 ENCOUNTER — Encounter: Payer: Self-pay | Admitting: Obstetrics & Gynecology

## 2018-09-12 ENCOUNTER — Other Ambulatory Visit: Payer: Self-pay

## 2018-09-12 ENCOUNTER — Ambulatory Visit (INDEPENDENT_AMBULATORY_CARE_PROVIDER_SITE_OTHER): Payer: PPO | Admitting: Obstetrics & Gynecology

## 2018-09-12 ENCOUNTER — Encounter

## 2018-09-12 VITALS — BP 92/60 | HR 72 | Resp 16 | Ht 62.75 in | Wt 108.2 lb

## 2018-09-12 DIAGNOSIS — Z01419 Encounter for gynecological examination (general) (routine) without abnormal findings: Secondary | ICD-10-CM

## 2018-09-12 NOTE — Progress Notes (Signed)
68 y.o. G80P2002 Married White or Caucasian female here for annual exam.  Doing well.  Continues to see cardiology every six months    Denies vaginal bleeding.   Doing Osteostrong as well as her regular exercise.    PCP:  Dr. Inda Merlin.  Saw him in the summer.  Did blood work when she saw him.    Patient's last menstrual period was 08/08/1983 (approximate).          Sexually active: No.  The current method of family planning is status post hysterectomy.    Exercising: Yes.    weights, aerobics  Smoker:  no  Health Maintenance: Pap:  09/05/17 Neg  History of abnormal Pap:  yes  MMG:  02/14/18 BIRADS1:neg  Colonoscopy:  11/10/15 f/u 10 years  BMD:   02/14/18 osteoporosis  TDaP:  2011 Pneumonia vaccine(s):  2011 Shingrix:   Completed  Hep C testing: 12/23/15 neg  Screening Labs: PCP   reports that she has never smoked. She has never used smokeless tobacco. She reports current alcohol use of about 5.0 standard drinks of alcohol per week. She reports that she does not use drugs.  Past Medical History:  Diagnosis Date  . Arthritis    lt shoulder  . Atrial flutter ([place])   . Depression   . Dyspareunia   . Dysrhythmia    a fib  . Fibroid   . Heart murmur   . Hypothyroidism   . Osteoporosis May, 2017  . Paroxysmal atrial fibrillation [facility])     Past Surgical History:  Procedure Laterality Date  . ABDOMINAL HYSTERECTOMY    . ABLATION  12-23-2013   PVI and CTI by Dr Rayann Heman  . ATRIAL FIBRILLATION ABLATION N/A 12/23/2013   Procedure: ATRIAL FIBRILLATION ABLATION;  Surgeon: Coralyn Mark, MD;  Location: [place] CATH LAB;  Service: Cardiovascular;  Laterality: N/A;  . AUGMENTATION MAMMAPLASTY Bilateral   . BREAST ENHANCEMENT SURGERY  83,11  . COSMETIC SURGERY  2003   face  . FLEXIBLE SIGMOIDOSCOPY N/A 07/29/2013   Procedure: FLEXIBLE SIGMOIDOSCOPY;  Surgeon: [name], MD;  Location: [facility] ENDOSCOPY;  Service: Endoscopy;  Laterality: N/A;  . FRACTURE SURGERY Right    wrist  . TEE WITHOUT  CARDIOVERSION N/A 12/22/2013   Procedure: TRANSESOPHAGEAL ECHOCARDIOGRAM (TEE);  Surgeon: [name], MD;  Location: [facility] ENDOSCOPY;  Service: Cardiovascular;  Laterality: N/A;  . TONSILLECTOMY    . TOTAL SHOULDER ARTHROPLASTY Left 04/23/2015   Procedure: LEFT TOTAL SHOULDER ARTHROPLASTY;  Surgeon: Netta Cedars, MD;  Location: [place];  Service: Orthopedics;  Laterality: Left;    Current Outpatient Medications  Medication Sig Dispense Refill  . acetaminophen (TYLENOL) 325 MG tablet Take 650 mg by mouth every 6 (six) hours as needed for mild pain or moderate pain.    . Calcium Carbonate-Vitamin D (CALCIUM-VITAMIN D) 600-125 MG-UNIT TABS Take 1 tablet by mouth 2 (two) times daily.    Marland Kitchen ELIQUIS 5 MG TABS tablet TAKE 1 TABLET BY MOUTH TWICE A DAY 180 tablet 3  . flecainide (TAMBOCOR) 50 MG tablet Take 50 mg by mouth 2 (two) times daily.     Marland Kitchen levothyroxine (SYNTHROID, LEVOTHROID) 50 MCG tablet Take 50 mcg by mouth daily.    . sertraline (ZOLOFT) 100 MG tablet Take 0.5 tablets by mouth daily.  2  . traZODone (DESYREL) 50 MG tablet Take 25-50 mg by mouth at bedtime.      No current facility-administered medications for this visit.     Family History  Problem  Relation Age of Onset  . Cancer Mother   . Hypertension Mother   . Thyroid disease Mother   . Cancer Paternal Grandmother 37  . AAA (abdominal aortic aneurysm) Paternal Grandmother   . Thyroid disease Sister   . Stroke Maternal Grandmother     Review of Systems  All other systems reviewed and are negative.  Exam:   BP 92/60 (BP Location: Right Arm, Patient Position: Sitting, Cuff Size: Normal)   Pulse 72   Resp 16   Ht 5' 2.75" (1.594 m)   Wt 108 lb 3.2 oz (49.1 kg)   LMP 08/08/1983 (Approximate)   BMI 19.32 kg/m     Height: 5' 2.75" (159.4 cm)  Ht Readings from Last 3 Encounters:  09/12/18 5' 2.75" (1.594 m)  06/14/18 5\' 3"  (1.6 m)  06/04/18 5\' 3"  (1.6 m)    General appearance: alert, cooperative and appears stated  age Head: Normocephalic, without obvious abnormality, atraumatic Neck: no adenopathy, supple, symmetrical, trachea midline and thyroid normal to inspection and palpation Lungs: clear to auscultation bilaterally Breasts: normal appearance, no masses or tenderness, bilateral implants Heart: regular rate and rhythm Abdomen: soft, non-tender; bowel sounds normal; no masses,  no organomegaly Extremities: extremities normal, atraumatic, no cyanosis or edema Skin: Skin color, texture, turgor normal. No rashes or lesions Lymph nodes: Cervical, supraclavicular, and axillary nodes normal. No abnormal inguinal nodes palpated Neurologic: Grossly normal   Pelvic: External genitalia:  no lesions              Urethra:  normal appearing urethra with no masses, tenderness or lesions              Bartholins and Skenes: normal                 Vagina: significant vaginal atrophic changes, no discharge, no lesions, continues thickened appearance at vaginal apex (stable from last year)              Cervix: no lesions              Pap taken: No. Bimanual Exam:  Uterus:  normal size, contour, position, consistency, mobility, non-tender              Adnexa: normal adnexa and no mass, fullness, tenderness               Rectovaginal: Confirms               Anus:  normal sphincter tone, no lesions  Chaperone was present for exam.  A:  Well Woman with normal exam PMP, no HRT H/o afib on flecanide and eliquis Osteoporosis, currently does not desires treatment with medication Bilateral breast implants Atypical appearance of vaginal (ultrasound 2014 negative, negative pap 2019)  P:   Mammogram guidelines reviewed.  She is doing 3D MMG. pap smear neg 2019 Lab work and vaccines UTD with Dr. Inda Merlin Plan BMD next year return annually or prn

## 2018-10-10 MED FILL — SYNTHROID 50 MCG TABLET: 50 | 90 days supply | Qty: 90 | Fill #1

## 2018-11-01 DIAGNOSIS — L821 Other seborrheic keratosis: Secondary | ICD-10-CM | POA: Diagnosis not present

## 2018-11-01 DIAGNOSIS — D1801 Hemangioma of skin and subcutaneous tissue: Secondary | ICD-10-CM | POA: Diagnosis not present

## 2018-11-01 DIAGNOSIS — L308 Other specified dermatitis: Secondary | ICD-10-CM | POA: Diagnosis not present

## 2018-11-01 DIAGNOSIS — D692 Other nonthrombocytopenic purpura: Secondary | ICD-10-CM | POA: Diagnosis not present

## 2018-11-01 DIAGNOSIS — L814 Other melanin hyperpigmentation: Secondary | ICD-10-CM | POA: Diagnosis not present

## 2018-11-12 ENCOUNTER — Ambulatory Visit: Payer: PPO | Admitting: Obstetrics & Gynecology

## 2018-11-21 DIAGNOSIS — L239 Allergic contact dermatitis, unspecified cause: Secondary | ICD-10-CM | POA: Diagnosis not present

## 2018-11-21 MED FILL — hydrOXYzine HCL 25 MG TABS: 25 | 6 days supply | Qty: 30 | Fill #0

## 2018-11-21 MED FILL — FLUTICASONE PROP 0.05% CRM: 0.05 | 30 days supply | Qty: 30 | Fill #0

## 2018-12-05 ENCOUNTER — Ambulatory Visit (HOSPITAL_COMMUNITY)
Admission: RE | Admit: 2018-12-05 | Discharge: 2018-12-05 | Disposition: A | Discharge status: Home / Self Care | Payer: PPO | Source: Ambulatory Visit | Attending: Nurse Practitioner | Admitting: Nurse Practitioner

## 2018-12-05 ENCOUNTER — Encounter (HOSPITAL_COMMUNITY): Payer: Self-pay | Admitting: Nurse Practitioner

## 2018-12-05 ENCOUNTER — Other Ambulatory Visit: Payer: Self-pay

## 2018-12-10 MED FILL — TRETINOIN 0.05 % CREA: 0.05 | 30 days supply | Qty: 20 | Fill #0

## 2018-12-11 ENCOUNTER — Other Ambulatory Visit (HOSPITAL_COMMUNITY): Payer: Self-pay | Admitting: Nurse Practitioner

## 2018-12-11 MED FILL — FLECAINIDE ACETATE 50 MG TA: 50 | 90 days supply | Qty: 180 | Fill #0

## 2018-12-11 MED FILL — SERTRALINE HCL 100 MG TAB: 100 | 90 days supply | Qty: 135 | Fill #0

## 2018-12-17 MED FILL — traZODone HCL 50 MG TABS: 50 | 90 days supply | Qty: 90 | Fill #1

## 2018-12-17 MED FILL — ELIQUIS 5 MG TABLET: 5 | 90 days supply | Qty: 180 | Fill #2

## 2019-01-01 ENCOUNTER — Other Ambulatory Visit: Payer: Self-pay | Admitting: Obstetrics & Gynecology

## 2019-01-01 DIAGNOSIS — Z1231 Encounter for screening mammogram for malignant neoplasm of breast: Secondary | ICD-10-CM

## 2019-01-03 ENCOUNTER — Encounter (HOSPITAL_COMMUNITY): Payer: Self-pay | Admitting: *Deleted

## 2019-01-08 ENCOUNTER — Telehealth (HOSPITAL_COMMUNITY): Payer: PPO | Admitting: Nurse Practitioner

## 2019-01-15 MED FILL — SYNTHROID 50 MCG TABLET: 50 | 90 days supply | Qty: 90 | Fill #2

## 2019-01-27 DIAGNOSIS — H26493 Other secondary cataract, bilateral: Secondary | ICD-10-CM | POA: Diagnosis not present

## 2019-01-28 ENCOUNTER — Ambulatory Visit (HOSPITAL_COMMUNITY)
Admission: RE | Admit: 2019-01-28 | Discharge: 2019-01-28 | Disposition: A | Discharge status: Home / Self Care | Payer: PPO | Source: Ambulatory Visit | Attending: Nurse Practitioner | Admitting: Nurse Practitioner

## 2019-01-28 ENCOUNTER — Other Ambulatory Visit: Payer: Self-pay

## 2019-01-28 ENCOUNTER — Encounter (HOSPITAL_COMMUNITY): Payer: Self-pay | Admitting: Nurse Practitioner

## 2019-01-28 VITALS — BP 92/56 | HR 71 | Ht 62.75 in | Wt 108.0 lb

## 2019-01-28 DIAGNOSIS — R9431 Abnormal electrocardiogram [ECG] [EKG]: Secondary | ICD-10-CM | POA: Insufficient documentation

## 2019-01-28 DIAGNOSIS — Z7901 Long term (current) use of anticoagulants: Secondary | ICD-10-CM | POA: Insufficient documentation

## 2019-01-28 DIAGNOSIS — M19012 Primary osteoarthritis, left shoulder: Secondary | ICD-10-CM | POA: Insufficient documentation

## 2019-01-28 DIAGNOSIS — Z79890 Hormone replacement therapy (postmenopausal): Secondary | ICD-10-CM | POA: Insufficient documentation

## 2019-01-28 DIAGNOSIS — Z809 Family history of malignant neoplasm, unspecified: Secondary | ICD-10-CM | POA: Insufficient documentation

## 2019-01-28 DIAGNOSIS — Z8249 Family history of ischemic heart disease and other diseases of the circulatory system: Secondary | ICD-10-CM | POA: Insufficient documentation

## 2019-01-28 DIAGNOSIS — E039 Hypothyroidism, unspecified: Secondary | ICD-10-CM | POA: Insufficient documentation

## 2019-01-28 DIAGNOSIS — F329 Major depressive disorder, single episode, unspecified: Secondary | ICD-10-CM | POA: Insufficient documentation

## 2019-01-28 DIAGNOSIS — I4892 Unspecified atrial flutter: Secondary | ICD-10-CM | POA: Diagnosis not present

## 2019-01-28 DIAGNOSIS — I959 Hypotension, unspecified: Secondary | ICD-10-CM | POA: Insufficient documentation

## 2019-01-28 DIAGNOSIS — Z79899 Other long term (current) drug therapy: Secondary | ICD-10-CM | POA: Insufficient documentation

## 2019-01-28 DIAGNOSIS — Z8349 Family history of other endocrine, nutritional and metabolic diseases: Secondary | ICD-10-CM | POA: Insufficient documentation

## 2019-01-28 DIAGNOSIS — I480 Paroxysmal atrial fibrillation: Secondary | ICD-10-CM | POA: Diagnosis not present

## 2019-01-28 NOTE — Progress Notes (Signed)
Primary Care Physician: Josetta Huddle, MD Referring Physician:Dr. Wilfred Curtis Odonnel is a 68 y.o. female with a h/o paroxymal afib that is in the afib clinic for f/u. She is on flecainide and is staying in SR. She does not notice any afib. She does have a low normal BP and is not on any rate control.   Today, she denies symptoms of palpitations, chest pain, shortness of breath, orthopnea, PND, lower extremity edema, dizziness, presyncope, syncope, or neurologic sequela. The patient is tolerating medications without difficulties and is otherwise without complaint today.   Past Medical History:  Diagnosis Date  . Arthritis    lt shoulder  . Atrial flutter ([place])   . Depression   . Dyspareunia   . Dysrhythmia    a fib  . Fibroid   . Heart murmur   . Hypothyroidism   . Osteoporosis May, 2017  . Paroxysmal atrial fibrillation [facility])    Past Surgical History:  Procedure Laterality Date  . ABDOMINAL HYSTERECTOMY    . ABLATION  12-23-2013   PVI and CTI by Dr Rayann Heman  . ATRIAL FIBRILLATION ABLATION N/A 12/23/2013   Procedure: ATRIAL FIBRILLATION ABLATION;  Surgeon: Coralyn Mark, MD;  Location: [place] CATH LAB;  Service: Cardiovascular;  Laterality: N/A;  . AUGMENTATION MAMMAPLASTY Bilateral   . BREAST ENHANCEMENT SURGERY  83,11  . COSMETIC SURGERY  2003   face  . FLEXIBLE SIGMOIDOSCOPY N/A 07/29/2013   Procedure: FLEXIBLE SIGMOIDOSCOPY;  Surgeon: [name], MD;  [facility] ENDOSCOPY;  Service: Endoscopy;  Laterality: N/A;  . FRACTURE SURGERY Right    wrist  . TEE WITHOUT CARDIOVERSION N/A 12/22/2013   Procedure: TRANSESOPHAGEAL ECHOCARDIOGRAM (TEE);  Surgeon: [name], MD;  Location: [facility] ENDOSCOPY;  Service: Cardiovascular;  Laterality: N/A;  . TONSILLECTOMY    . TOTAL SHOULDER ARTHROPLASTY Left 04/23/2015   Procedure: LEFT TOTAL SHOULDER ARTHROPLASTY;  Surgeon: Netta Cedars, MD;  Location: [place];  Service: Orthopedics;  Laterality: Left;    Current Outpatient  Medications  Medication Sig Dispense Refill  . acetaminophen (TYLENOL) 325 MG tablet Take 650 mg by mouth every 6 (six) hours as needed for mild pain or moderate pain.    . Calcium Carbonate-Vitamin D (CALCIUM-VITAMIN D) 600-125 MG-UNIT TABS Take 1 tablet by mouth 2 (two) times daily.    Marland Kitchen ELIQUIS 5 MG TABS tablet TAKE 1 TABLET BY MOUTH TWICE A DAY 180 tablet 3  . flecainide (TAMBOCOR) 50 MG tablet TAKE 1 TABLET BY MOUTH 2 TIMES DAILY. 180 tablet 2  . levothyroxine (SYNTHROID, LEVOTHROID) 50 MCG tablet Take 50 mcg by mouth daily.    . sertraline (ZOLOFT) 100 MG tablet Take 0.5 tablets by mouth daily.  2  . traZODone (DESYREL) 50 MG tablet Take 25-50 mg by mouth at bedtime.      No current facility-administered medications for this encounter.     No Known Allergies  Social History   Socioeconomic History  . Marital status: Married    Spouse name: Not on file  . Number of children: Not on file  . Years of education: Not on file  . Highest education level: Not on file  Occupational History  . Not on file  Social Needs  . Financial resource strain: Not on file  . Food insecurity    Worry: Not on file    Inability: Not on file  . Transportation needs    Medical: Not on file    Non-medical: Not on file  Tobacco Use  . Smoking status: Never Smoker  . Smokeless tobacco: Never Used  Substance and Sexual Activity  . Alcohol use: Yes    Alcohol/week: 5.0 standard drinks    Types: 5 Glasses of wine per week  . Drug use: No  . Sexual activity: Not Currently    Partners: Male    Birth control/protection: None, Surgical    Comment: TVH--still has ovaries  Lifestyle  . Physical activity    Days per week: Not on file    Minutes per session: Not on file  . Stress: Not on file  Relationships  . Social Herbalist on phone: Not on file    Gets together: Not on file    Attends religious service: Not on file    Active member of club or organization: Not on file    Attends  meetings of clubs or organizations: Not on file    Relationship status: Not on file  . Intimate partner violence    Fear of current or ex partner: Not on file    Emotionally abused: Not on file    Physically abused: Not on file    Forced sexual activity: Not on file  Other Topics Concern  . Not on file  Social History Narrative   Pt lives in [place] with spouse.  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    Family History  Problem Relation Age of Onset  . Cancer Mother   . Hypertension Mother   . Thyroid disease Mother   . Cancer Paternal Grandmother 22  . AAA (abdominal aortic aneurysm) Paternal Grandmother   . Thyroid disease Sister   . Stroke Maternal Grandmother     ROS- All systems are reviewed and negative except as per the HPI above  Physical Exam: Vitals:   01/28/19 1432  BP: (!) 92/56  Pulse: 71  Weight: 49 kg  Height: 5' 2.75" (1.594 m)   Wt Readings from Last 3 Encounters:  01/28/19 49 kg  09/12/18 49.1 kg  06/14/18 49.9 kg    Labs: Lab Results  Component Value Date   NA 135 06/04/2018   K 3.9 06/04/2018   CL 100 06/04/2018   CO2 28 06/04/2018   GLUCOSE 124 (H) 06/04/2018   BUN 16 06/04/2018   CREATININE 0.75 06/04/2018   CALCIUM 9.5 06/04/2018   Lab Results  Component Value Date   INR 1.03 04/14/2015   Lab Results  Component Value Date   CHOL 250 (H) 09/22/2013   HDL 110.80 09/22/2013   TRIG 59.0 09/22/2013     GEN- The patient is well appearing, alert and oriented x 3 today.   Head- normocephalic, atraumatic Eyes-  Sclera clear, conjunctiva pink Ears- hearing intact Oropharynx- clear Neck- supple, no JVP Lymph- no cervical lymphadenopathy Lungs- Clear to ausculation bilaterally, normal work of breathing Heart- Regular rate and rhythm, no murmurs, rubs or gallops, PMI not laterally displaced GI- soft, NT, ND, + BS Extremities- no clubbing, cyanosis, or edema MS- no significant deformity or atrophy Skin- no rash or  lesion Psych- euthymic mood, full affect Neuro- strength and sensation are intact  EKG-SR with first degree AV block, pr int 218 ms, qrs int 86 ms, qtc 436 ms Epic records reviewed    Assessment and Plan: 1. Paroxysmal  afib She is not aware of any afib Continue flecainide 25 mg bid, not on rate control for low normal BP   2. CHA2DS2VASc score of 2 By newest guidelines, pt does  not require daily anticoagulation We discussed this in detail She is tolerating well and think about if she wishes to stop If so, I would keep on hand and restart if any prolonged episode of afib  3. Hypotension Pt's norm Asymptomatic   F/u in 6 months  Butch Penny C. Ismael Karge, [facility] [address] [place], [place] [address] [phone]

## 2019-02-11 DIAGNOSIS — H26492 Other secondary cataract, left eye: Secondary | ICD-10-CM | POA: Diagnosis not present

## 2019-02-17 ENCOUNTER — Ambulatory Visit: Payer: PPO

## 2019-03-11 ENCOUNTER — Other Ambulatory Visit: Payer: Self-pay | Admitting: Obstetrics & Gynecology

## 2019-03-11 ENCOUNTER — Other Ambulatory Visit: Payer: Self-pay

## 2019-03-11 ENCOUNTER — Ambulatory Visit
Admission: RE | Admit: 2019-03-11 | Discharge: 2019-03-11 | Disposition: A | Discharge status: Home / Self Care | Payer: PPO | Source: Ambulatory Visit | Attending: Obstetrics & Gynecology | Admitting: Obstetrics & Gynecology

## 2019-03-11 DIAGNOSIS — Z1231 Encounter for screening mammogram for malignant neoplasm of breast: Secondary | ICD-10-CM

## 2019-03-13 DIAGNOSIS — F325 Major depressive disorder, single episode, in full remission: Secondary | ICD-10-CM | POA: Diagnosis not present

## 2019-03-13 DIAGNOSIS — K589 Irritable bowel syndrome without diarrhea: Secondary | ICD-10-CM | POA: Diagnosis not present

## 2019-03-13 DIAGNOSIS — G4730 Sleep apnea, unspecified: Secondary | ICD-10-CM | POA: Diagnosis not present

## 2019-03-13 DIAGNOSIS — I4891 Unspecified atrial fibrillation: Secondary | ICD-10-CM | POA: Diagnosis not present

## 2019-03-13 DIAGNOSIS — E039 Hypothyroidism, unspecified: Secondary | ICD-10-CM | POA: Diagnosis not present

## 2019-03-13 DIAGNOSIS — E559 Vitamin D deficiency, unspecified: Secondary | ICD-10-CM | POA: Diagnosis not present

## 2019-03-13 DIAGNOSIS — L659 Nonscarring hair loss, unspecified: Secondary | ICD-10-CM | POA: Diagnosis not present

## 2019-03-13 DIAGNOSIS — Z79899 Other long term (current) drug therapy: Secondary | ICD-10-CM | POA: Diagnosis not present

## 2019-03-13 DIAGNOSIS — D6869 Other thrombophilia: Secondary | ICD-10-CM | POA: Diagnosis not present

## 2019-03-13 DIAGNOSIS — Z0001 Encounter for general adult medical examination with abnormal findings: Secondary | ICD-10-CM | POA: Diagnosis not present

## 2019-03-13 DIAGNOSIS — M810 Age-related osteoporosis without current pathological fracture: Secondary | ICD-10-CM | POA: Diagnosis not present

## 2019-03-13 DIAGNOSIS — R634 Abnormal weight loss: Secondary | ICD-10-CM | POA: Diagnosis not present

## 2019-03-13 MED FILL — ELIQUIS 5 MG TABLET: 5 | 90 days supply | Qty: 180 | Fill #3

## 2019-03-13 MED FILL — FLECAINIDE ACETATE 50 MG TA: 50 | 90 days supply | Qty: 180 | Fill #1

## 2019-03-18 DIAGNOSIS — F325 Major depressive disorder, single episode, in full remission: Secondary | ICD-10-CM | POA: Diagnosis not present

## 2019-03-18 DIAGNOSIS — E559 Vitamin D deficiency, unspecified: Secondary | ICD-10-CM | POA: Diagnosis not present

## 2019-03-18 DIAGNOSIS — E039 Hypothyroidism, unspecified: Secondary | ICD-10-CM | POA: Diagnosis not present

## 2019-03-18 DIAGNOSIS — I4891 Unspecified atrial fibrillation: Secondary | ICD-10-CM | POA: Diagnosis not present

## 2019-04-01 DIAGNOSIS — H26491 Other secondary cataract, right eye: Secondary | ICD-10-CM | POA: Diagnosis not present

## 2019-04-08 MED FILL — SYNTHROID 50 MCG TABLET: 50 | 90 days supply | Qty: 90 | Fill #3

## 2019-05-07 ENCOUNTER — Telehealth: Payer: Self-pay

## 2019-05-07 NOTE — Telephone Encounter (Signed)
Please comment on eliquis. 

## 2019-05-07 NOTE — Telephone Encounter (Signed)
   [facility] Pre-operative Risk Assessment    Request for surgical clearance:  1. What type of surgery is being performed? BILATERAL LOWER EYELID BLEPHAROPLASTY    2. When is this surgery scheduled? 05/20/19  3. What type of clearance is required (medical clearance vs. Pharmacy clearance to hold med vs. Both)? MEDICAL  4. Are there any medications that need to be held prior to surgery and how long? ELIQUIS    5. Practice name and name of physician performing surgery? [facility]    6. What is your office phone number [phone]    7.   What is your office fax number [phone]  8.   Anesthesia type (None, local, MAC, general) ? LOCAL   Terri [name] 05/07/2019, 4:05 PM  _________________________________________________________________   (provider comments below)

## 2019-05-08 NOTE — Telephone Encounter (Signed)
Patient with diagnosis of afib on Eliquis for anticoagulation.    Procedure: BILATERAL LOWER EYELID BLEPHAROPLASTY   Date of procedure: 05/20/2019  CHADS2-VASc score of  2 (AGE, female)  CrCl 53 ml/min  Per office protocol, patient can hold Eliquis for 2 days prior to procedure.

## 2019-05-08 NOTE — Telephone Encounter (Signed)
   Primary Cardiologist: Thompson Grayer, MD  Chart reviewed as part of pre-operative protocol coverage. Patient was contacted 05/08/2019 in reference to pre-operative risk assessment for pending surgery as outlined below.  Kiernan Brinckerhoff Winrow was last seen on 01/28/19 by Roderic Palau NP.  Since that day, Charlise Bhandari has done well.  She exercises daily and does not have a history of CAD, MI, or stroke.  Per our clinical pharmacist: Patient with diagnosis of afib on Eliquis for anticoagulation.    Procedure: BILATERAL LOWER EYELID BLEPHAROPLASTY Date of procedure: 05/20/2019  CHADS2-VASc score of  2 (AGE, female)  CrCl 53 ml/min  Per office protocol, patient can hold Eliquis for 2 days prior to procedure  Therefore, based on ACC/AHA guidelines, the patient would be at acceptable risk for the planned procedure without further cardiovascular testing.   I will route this recommendation to the requesting party via Epic fax function and remove from pre-op pool.  Please call with questions.  [place], PA 05/08/2019, 3:08 PM

## 2019-05-08 NOTE — Telephone Encounter (Signed)
Left VM

## 2019-05-14 MED FILL — NEO/POLY/DEXAMET EYE OINT: 3.5-10000-0 | 3 days supply | Qty: 4 | Fill #0

## 2019-05-15 MED FILL — LORazepam 1 MG TABS: 1 | 1 days supply | Qty: 2 | Fill #0

## 2019-05-15 MED FILL — diazePAM 5 MG TABS: 5 | 1 days supply | Qty: 2 | Fill #0

## 2019-05-20 MED FILL — HYDROCODON-APAP 5-325: 5-325 | 3 days supply | Qty: 12 | Fill #0

## 2019-05-23 MED FILL — traZODone HCL 50 MG TABS: 50 | 90 days supply | Qty: 90 | Fill #0

## 2019-06-04 MED FILL — FLECAINIDE ACETATE 50 MG TA: 50 | 90 days supply | Qty: 180 | Fill #2

## 2019-07-07 MED FILL — SYNTHROID 50 MCG TABLET: 50 | 90 days supply | Qty: 90 | Fill #0

## 2019-08-20 ENCOUNTER — Other Ambulatory Visit: Payer: Self-pay | Admitting: Internal Medicine

## 2019-08-20 MED FILL — ELIQUIS 5 MG TABLET: 5 | 90 days supply | Qty: 180 | Fill #0

## 2019-08-20 NOTE — Telephone Encounter (Signed)
Eliquis 5mg  refill request received, pt is 69yrs old, weight-49kg, Crea- 0.70 on 03/18/2019 via [place] at [place], [place], and last seen by Roderic Palau on 01/28/2019. Dose is appropriate based on dosing criteria. Will send in refill to requested pharmacy.

## 2019-08-24 ENCOUNTER — Ambulatory Visit: Payer: PPO | Attending: Internal Medicine

## 2019-08-24 DIAGNOSIS — Z23 Encounter for immunization: Secondary | ICD-10-CM | POA: Insufficient documentation

## 2019-08-24 NOTE — Progress Notes (Signed)
   Covid-19 Vaccination Clinic  Name:  Terri [name]    MRN: [id number] DOB: [birth year]  08/24/2019  Terri [name] was observed post Covid-19 immunization for 15 minutes without incidence. She was provided with Vaccine Information Sheet and instruction to access the V-Safe system.   Terri [name] was instructed to call 911 with any severe reactions post vaccine: Marland Kitchen Difficulty breathing  . Swelling of your face and throat  . A fast heartbeat  . A bad rash all over your body  . Dizziness and weakness

## 2019-09-08 ENCOUNTER — Other Ambulatory Visit (HOSPITAL_COMMUNITY): Payer: Self-pay | Admitting: Nurse Practitioner

## 2019-09-08 MED FILL — FLECAINIDE ACETATE 50 MG TA: 50 | 30 days supply | Qty: 30 | Fill #0

## 2019-09-14 ENCOUNTER — Ambulatory Visit: Payer: PPO | Attending: Internal Medicine

## 2019-09-14 DIAGNOSIS — Z23 Encounter for immunization: Secondary | ICD-10-CM

## 2019-09-14 NOTE — Progress Notes (Signed)
   Covid-19 Vaccination Clinic  Name:  Terri [name]    MRN: [id number] DOB: February 15, 1951  09/14/2019  Terri [name] was observed post Covid-19 immunization for 15 minutes without incidence. She was provided with Vaccine Information Sheet and instruction to access the V-Safe system.   Terri [name] was instructed to call 911 with any severe reactions post vaccine: Marland Kitchen Difficulty breathing  . Swelling of your face and throat  . A fast heartbeat  . A bad rash all over your body  . Dizziness and weakness    Immunizations Administered    Name Date Dose VIS Date Route   Pfizer COVID-19 Vaccine 09/14/2019 10:10 AM 0.3 mL 07/18/2019 Intramuscular   Manufacturer: [place]   Lot: [id number]   [place]: [id number]

## 2019-09-22 ENCOUNTER — Other Ambulatory Visit: Payer: Self-pay

## 2019-09-23 MED FILL — TRETINOIN 0.05 % CREA: 0.05 | 30 days supply | Qty: 20 | Fill #1

## 2019-09-24 ENCOUNTER — Other Ambulatory Visit (HOSPITAL_COMMUNITY): Payer: Self-pay | Admitting: *Deleted

## 2019-09-24 MED ORDER — FLECAINIDE ACETATE 50 MG PO TABS: 25.0000 mg | ORAL_TABLET | Freq: Two times a day (BID) | ORAL | 3 refills | Status: DC

## 2019-09-24 MED FILL — FLECAINIDE ACETATE 50 MG TA: 50 | 7 days supply | Qty: 14 | Fill #0

## 2019-09-25 ENCOUNTER — Ambulatory Visit (HOSPITAL_COMMUNITY): Payer: PPO | Admitting: Nurse Practitioner

## 2019-09-26 ENCOUNTER — Ambulatory Visit (INDEPENDENT_AMBULATORY_CARE_PROVIDER_SITE_OTHER): Payer: PPO | Admitting: Obstetrics & Gynecology

## 2019-09-26 ENCOUNTER — Encounter: Payer: Self-pay | Admitting: Obstetrics & Gynecology

## 2019-09-26 ENCOUNTER — Other Ambulatory Visit: Payer: Self-pay

## 2019-09-26 VITALS — BP 100/60 | HR 64 | Temp 97.3°F | Resp 10 | Ht 62.75 in | Wt 108.0 lb

## 2019-09-26 DIAGNOSIS — Z01419 Encounter for gynecological examination (general) (routine) without abnormal findings: Secondary | ICD-10-CM | POA: Diagnosis not present

## 2019-09-26 DIAGNOSIS — M816 Localized osteoporosis [Lequesne]: Secondary | ICD-10-CM

## 2019-09-26 NOTE — Patient Instructions (Signed)
Please check with Dr. Inda Merlin about whether your tetanus shot is updated.  If it is, can you please let me know when it was done.

## 2019-09-26 NOTE — Progress Notes (Signed)
69 y.o. G41P2002 Married White or Caucasian female here for annual exam. Doing well.  Has finished her Covid vaccine series.     Patient's last menstrual period was 08/08/1983 (approximate).          Sexually active: No.  The current method of family planning is status post hysterectomy.    Exercising: Yes.    walking, weights, and pilates Smoker:  no  Health Maintenance: Pap:  09/05/17 Neg  History of abnormal Pap:  Yes, years ago MMG:  03/11/19 BIRADS 1 negative/density b Colonoscopy:   11/10/15 f/u 10 years  BMD:   02/14/18 osteoporosis  TDaP:  2011 Pneumonia vaccine(s):  completed Shingrix:   completed Hep C testing: 2017 Neg Screening Labs: PCP   reports that she has never smoked. She has never used smokeless tobacco. She reports current alcohol use of about 5.0 standard drinks of alcohol per week. She reports that she does not use drugs.  Past Medical History:  Diagnosis Date  . Arthritis    lt shoulder  . Atrial flutter ([place])   . Depression   . Dyspareunia   . Dysrhythmia    a fib  . Fibroid   . Heart murmur   . Hypothyroidism   . Osteoporosis May, 2017  . Paroxysmal atrial fibrillation [facility])     Past Surgical History:  Procedure Laterality Date  . ABDOMINAL HYSTERECTOMY    . ABLATION  12-23-2013   PVI and CTI by Dr Rayann Heman  . ATRIAL FIBRILLATION ABLATION N/A 12/23/2013   Procedure: ATRIAL FIBRILLATION ABLATION;  Surgeon: Coralyn Mark, MD;  Location: [place] CATH LAB;  Service: Cardiovascular;  Laterality: N/A;  . AUGMENTATION MAMMAPLASTY Bilateral   . BREAST ENHANCEMENT SURGERY  83,11  . COSMETIC SURGERY  2003   face  . FLEXIBLE SIGMOIDOSCOPY N/A 07/29/2013   Procedure: FLEXIBLE SIGMOIDOSCOPY;  Surgeon: [name], MD;  [facility] ENDOSCOPY;  Service: Endoscopy;  Laterality: N/A;  . FRACTURE SURGERY Right    wrist  . TEE WITHOUT CARDIOVERSION N/A 12/22/2013   Procedure: TRANSESOPHAGEAL ECHOCARDIOGRAM (TEE);  Surgeon: [name], MD;  Location[facility] ENDOSCOPY;   Service: Cardiovascular;  Laterality: N/A;  . TONSILLECTOMY    . TOTAL SHOULDER ARTHROPLASTY Left 04/23/2015   Procedure: LEFT TOTAL SHOULDER ARTHROPLASTY;  Surgeon: Netta Cedars, MD;  Location: [place];  Service: Orthopedics;  Laterality: Left;    Current Outpatient Medications  Medication Sig Dispense Refill  . acetaminophen (TYLENOL) 325 MG tablet Take 650 mg by mouth every 6 (six) hours as needed for mild pain or moderate pain.    . Calcium Carbonate-Vitamin D (CALCIUM-VITAMIN D) 600-125 MG-UNIT TABS Take 1 tablet by mouth 2 (two) times daily.    Marland Kitchen ELIQUIS 5 MG TABS tablet TAKE 1 TABLET BY MOUTH TWICE A DAY 180 tablet 2  . flecainide (TAMBOCOR) 50 MG tablet Take 0.5 tablets (25 mg total) by mouth 2 (two) times daily. 30 tablet 3  . levothyroxine (SYNTHROID, LEVOTHROID) 50 MCG tablet Take 50 mcg by mouth daily.    . sertraline (ZOLOFT) 100 MG tablet Take 0.5 tablets by mouth daily.  2  . traZODone (DESYREL) 50 MG tablet Take 25-50 mg by mouth at bedtime.     . tretinoin (RETIN-A) 0.05 % cream Apply 1 application topically at bedtime.     No current facility-administered medications for this visit.    Family History  Problem Relation Age of Onset  . Cancer Mother   . Hypertension Mother   . Thyroid disease  Mother   . Cancer Paternal Grandmother 24  . AAA (abdominal aortic aneurysm) Paternal Grandmother   . Thyroid disease Sister   . Stroke Maternal Grandmother     Review of Systems  All other systems reviewed and are negative.   Exam:   BP 100/60 (BP Location: Right Arm, Patient Position: Sitting, Cuff Size: Normal)   Pulse 64   Temp (!) 97.3 F (36.3 C) (Temporal)   Resp 10   Ht 5' 2.75" (1.594 m)   Wt 108 lb (49 kg)   LMP 08/08/1983 (Approximate)   BMI 19.28 kg/m    Height: 5' 2.75" (159.4 cm)  Ht Readings from Last 3 Encounters:  09/26/19 5' 2.75" (1.594 m)  01/28/19 5' 2.75" (1.594 m)  09/12/18 5' 2.75" (1.594 m)    General appearance: alert, cooperative and  appears stated age Head: Normocephalic, without obvious abnormality, atraumatic Neck: no adenopathy, supple, symmetrical, trachea midline and thyroid normal to inspection and palpation Lungs: clear to auscultation bilaterally Breasts: normal appearance, no masses or tenderness Heart: regular rate and rhythm Abdomen: soft, non-tender; bowel sounds normal; no masses,  no organomegaly Extremities: extremities normal, atraumatic, no cyanosis or edema Skin: Skin color, texture, turgor normal. No rashes or lesions Lymph nodes: Cervical, supraclavicular, and axillary nodes normal. No abnormal inguinal nodes palpated Neurologic: Grossly normal  Pelvic: External genitalia:  no lesions              Urethra:  normal appearing urethra with no masses, tenderness or lesions              Bartholins and Skenes: normal                 Vagina: normal appearing vagina with normal color and discharge, no lesions              Cervix: no lesions              Pap taken: No. Bimanual Exam:  Uterus:  normal size, contour, position, consistency, mobility, non-tender              Adnexa: normal adnexa and no mass, fullness, tenderness               Rectovaginal: Confirms               Anus:  normal sphincter tone, no lesions  Chaperone, Terence Lux, CMA, was present for exam.  A:  Well Woman with normal exam PMP, no HRT H/o afib on flecainide and eliquis Osteoporosis, has declined treatment but has been doing osteostrong Bilateral breast implants  P:   Mammogram guidelines reviewed.  Doing yearly.   pap smear neg 2019.  Not indicated.  Will plan BMD with MMG this summer.  Order placed. Lab work done with Dr. Inda Merlin She is going to check about when Tdap is due Return annually or prn

## 2019-09-28 MED FILL — SERTRALINE HCL 100 MG TAB: 100 | 90 days supply | Qty: 135 | Fill #1

## 2019-09-28 MED FILL — SYNTHROID 50 MCG TABLET: 50 | 90 days supply | Qty: 90 | Fill #1

## 2019-09-28 MED FILL — traZODone HCL 50 MG TABS: 50 | 90 days supply | Qty: 90 | Fill #1

## 2019-09-30 ENCOUNTER — Telehealth: Payer: Self-pay | Admitting: Obstetrics & Gynecology

## 2019-09-30 ENCOUNTER — Ambulatory Visit (HOSPITAL_COMMUNITY)
Admission: RE | Admit: 2019-09-30 | Discharge: 2019-09-30 | Disposition: A | Discharge status: Home / Self Care | Payer: PPO | Source: Ambulatory Visit | Attending: Nurse Practitioner | Admitting: Nurse Practitioner

## 2019-09-30 ENCOUNTER — Other Ambulatory Visit: Payer: Self-pay

## 2019-09-30 ENCOUNTER — Encounter (HOSPITAL_COMMUNITY): Payer: Self-pay | Admitting: Nurse Practitioner

## 2019-09-30 VITALS — BP 108/70 | HR 68 | Ht 62.75 in | Wt 111.4 lb

## 2019-09-30 DIAGNOSIS — I959 Hypotension, unspecified: Secondary | ICD-10-CM | POA: Diagnosis not present

## 2019-09-30 DIAGNOSIS — Z8249 Family history of ischemic heart disease and other diseases of the circulatory system: Secondary | ICD-10-CM | POA: Insufficient documentation

## 2019-09-30 DIAGNOSIS — Z79899 Other long term (current) drug therapy: Secondary | ICD-10-CM | POA: Diagnosis not present

## 2019-09-30 DIAGNOSIS — I480 Paroxysmal atrial fibrillation: Secondary | ICD-10-CM | POA: Diagnosis not present

## 2019-09-30 DIAGNOSIS — E039 Hypothyroidism, unspecified: Secondary | ICD-10-CM | POA: Diagnosis not present

## 2019-09-30 DIAGNOSIS — Z7901 Long term (current) use of anticoagulants: Secondary | ICD-10-CM | POA: Insufficient documentation

## 2019-09-30 MED ORDER — FLECAINIDE ACETATE 50 MG PO TABS: 50.0000 mg | ORAL_TABLET | Freq: Two times a day (BID) | ORAL | 6 refills | Status: DC

## 2019-09-30 NOTE — Telephone Encounter (Signed)
Spoke with patient. Patient states she received Tdap today at [facility]. Updated this is patient's chart. Patient is agreeable. Encounter closed.

## 2019-09-30 NOTE — Telephone Encounter (Signed)
Patient calling to update chart with tdap vaccine that she received today.

## 2019-09-30 NOTE — Progress Notes (Signed)
Primary Care Physician: Josetta Huddle, MD Referring Physician:Dr. Drucie Ip Brinckerhoff Nishioka is a 69 y.o. female with a h/o paroxymal afib that is in the afib clinic for f/u. She is on flecainide and is staying in SR. She does not notice any afib. She does have a low normal BP and is not on any rate control. In previous notes, pt stated that she was taking 25 mg bid of flecainide. She states that this was her mistake, she was taking 50 mg flecainide all along.   Today, she denies symptoms of palpitations, chest pain, shortness of breath, orthopnea, PND, lower extremity edema, dizziness, presyncope, syncope, or neurologic sequela. The patient is tolerating medications without difficulties and is otherwise without complaint today.   Past Medical History:  Diagnosis Date  . Arthritis    lt shoulder  . Atrial flutter ([place])   . Depression   . Dyspareunia   . Dysrhythmia    a fib  . Fibroid   . Heart murmur   . Hypothyroidism   . Osteoporosis May, 2017  . Paroxysmal atrial fibrillation [facility])    Past Surgical History:  Procedure Laterality Date  . ABDOMINAL HYSTERECTOMY    . ABLATION  12-23-2013   PVI and CTI by Dr Rayann Heman  . ATRIAL FIBRILLATION ABLATION N/A 12/23/2013   Procedure: ATRIAL FIBRILLATION ABLATION;  Surgeon: Coralyn Mark, MD;  Location: [place] CATH LAB;  Service: Cardiovascular;  Laterality: N/A;  . AUGMENTATION MAMMAPLASTY Bilateral   . BREAST ENHANCEMENT SURGERY  83,11  . COSMETIC SURGERY  2003   face  . FLEXIBLE SIGMOIDOSCOPY N/A 07/29/2013   Procedure: FLEXIBLE SIGMOIDOSCOPY;  Surgeon: [name], MD;  Location: [facility] ENDOSCOPY;  Service: Endoscopy;  Laterality: N/A;  . FRACTURE SURGERY Right    wrist  . TEE WITHOUT CARDIOVERSION N/A 12/22/2013   Procedure: TRANSESOPHAGEAL ECHOCARDIOGRAM (TEE);  Surgeon: [name], MD;  Location: [facility] ENDOSCOPY;  Service: Cardiovascular;  Laterality: N/A;  . TONSILLECTOMY    . TOTAL SHOULDER ARTHROPLASTY Left 04/23/2015     Procedure: LEFT TOTAL SHOULDER ARTHROPLASTY;  Surgeon: Netta Cedars, MD;  Location: [place];  Service: Orthopedics;  Laterality: Left;    Current Outpatient Medications  Medication Sig Dispense Refill  . acetaminophen (TYLENOL) 325 MG tablet Take by mouth as needed for mild pain or moderate pain.     . Calcium Carbonate-Vitamin D (CALCIUM-VITAMIN D) 600-125 MG-UNIT TABS Take 1 tablet by mouth 2 (two) times daily.    Marland Kitchen ELIQUIS 5 MG TABS tablet TAKE 1 TABLET BY MOUTH TWICE A DAY 180 tablet 2  . flecainide (TAMBOCOR) 50 MG tablet Take 1 tablet (50 mg total) by mouth 2 (two) times daily. 60 tablet 6  . levothyroxine (SYNTHROID, LEVOTHROID) 50 MCG tablet Take 50 mcg by mouth daily.    . sertraline (ZOLOFT) 100 MG tablet Take 0.5 tablets by mouth daily.  2  . traZODone (DESYREL) 50 MG tablet Take 25-50 mg by mouth at bedtime.     . tretinoin (RETIN-A) 0.05 % cream Apply 1 application topically at bedtime.     No current facility-administered medications for this encounter.    No Known Allergies  Social History   Socioeconomic History  . Marital status: Married    Spouse name: Not on file  . Number of children: Not on file  . Years of education: Not on file  . Highest education level: Not on file  Occupational History  . Not on file  Tobacco Use  .  Smoking status: Never Smoker  . Smokeless tobacco: Never Used  Substance and Sexual Activity  . Alcohol use: Yes    Alcohol/week: 5.0 standard drinks    Types: 5 Glasses of wine per week  . Drug use: No  . Sexual activity: Not Currently    Partners: Male    Birth control/protection: None, Surgical    Comment: TVH--still has ovaries  Other Topics Concern  . Not on file  Social History Narrative   Pt lives in [place] with spouse.  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   Social Determinants of Health   Financial Resource Strain:   . Difficulty of Paying Living Expenses: Not on file  Food Insecurity:   . Worried About Paediatric nurse in the Last Year: Not on file  . Ran Out of Food in the Last Year: Not on file  Transportation Needs:   . Lack of Transportation (Medical): Not on file  . Lack of Transportation (Non-Medical): Not on file  Physical Activity:   . Days of Exercise per Week: Not on file  . Minutes of Exercise per Session: Not on file  Stress:   . Feeling of Stress : Not on file  Social Connections:   . Frequency of Communication with Friends and Family: Not on file  . Frequency of Social Gatherings with Friends and Family: Not on file  . Attends Religious Services: Not on file  . Active Member of Clubs or Organizations: Not on file  . Attends Archivist Meetings: Not on file  . Marital Status: Not on file  Intimate Partner Violence:   . Fear of Current or Ex-Partner: Not on file  . Emotionally Abused: Not on file  . Physically Abused: Not on file  . Sexually Abused: Not on file    Family History  Problem Relation Age of Onset  . Cancer Mother   . Hypertension Mother   . Thyroid disease Mother   . Cancer Paternal Grandmother 74  . AAA (abdominal aortic aneurysm) Paternal Grandmother   . Thyroid disease Sister   . Stroke Maternal Grandmother     ROS- All systems are reviewed and negative except as per the HPI above  Physical Exam: Vitals:   09/30/19 0932  BP: 108/70  Pulse: 68  Weight: 50.5 kg  Height: 5' 2.75" (1.594 m)   Wt Readings from Last 3 Encounters:  09/30/19 50.5 kg  09/26/19 49 kg  01/28/19 49 kg    Labs: Lab Results  Component Value Date   NA 135 06/04/2018   K 3.9 06/04/2018   CL 100 06/04/2018   CO2 28 06/04/2018   GLUCOSE 124 (H) 06/04/2018   BUN 16 06/04/2018   CREATININE 0.75 06/04/2018   CALCIUM 9.5 06/04/2018   Lab Results  Component Value Date   INR 1.03 04/14/2015   Lab Results  Component Value Date   CHOL 250 (H) 09/22/2013   HDL 110.80 09/22/2013   TRIG 59.0 09/22/2013     GEN- The patient is well appearing, alert and  oriented x 3 today.   Head- normocephalic, atraumatic Eyes-  Sclera clear, conjunctiva pink Ears- hearing intact Oropharynx- clear Neck- supple, no JVP Lymph- no cervical lymphadenopathy Lungs- Clear to ausculation bilaterally, normal work of breathing Heart- Regular rate and rhythm, no murmurs, rubs or gallops, PMI not laterally displaced GI- soft, NT, ND, + BS Extremities- no clubbing, cyanosis, or edema MS- no significant deformity or atrophy Skin- no rash or lesion Psych- euthymic  mood, full affect Neuro- strength and sensation are intact  EKG-SR, pr int 204 ms, qrs int 88 ms, qtc 416 ms, rate 68 bpm Epic records reviewed    Assessment and Plan: 1. Paroxysmal  afib She is not aware of any afib Continue flecainide 50  mg bid, not on rate control for low normal BP   2. CHA2DS2VASc score of 2 Cmet reviewed from  Dr. Inda Merlin office from 03/2019 with a creatinine of 0.7, with age 62 and weight of 111 lb, she will continue on eliquis 5 mg bid, appropriately dosed  3. Hypotension Pt's norm Asymptomatic   F/u in 6 months  Butch Penny C. Vanesha Athens, [facility] [address] [place], [place] [address] [phone]

## 2019-10-01 MED FILL — FLECAINIDE ACETATE 50 MG TA: 50 | 30 days supply | Qty: 60 | Fill #0

## 2019-10-28 ENCOUNTER — Other Ambulatory Visit (HOSPITAL_COMMUNITY): Payer: Self-pay

## 2019-10-28 MED ORDER — FLECAINIDE ACETATE 50 MG PO TABS: 50.0000 mg | ORAL_TABLET | Freq: Two times a day (BID) | ORAL | 1 refills | Status: DC

## 2019-10-28 MED FILL — FLECAINIDE ACETATE 50 MG TA: 50 | 90 days supply | Qty: 180 | Fill #0

## 2019-11-24 MED FILL — ELIQUIS 5 MG TABLET: 5 | 90 days supply | Qty: 180 | Fill #1

## 2019-12-26 DIAGNOSIS — L821 Other seborrheic keratosis: Secondary | ICD-10-CM | POA: Diagnosis not present

## 2019-12-26 DIAGNOSIS — D485 Neoplasm of uncertain behavior of skin: Secondary | ICD-10-CM | POA: Diagnosis not present

## 2019-12-26 DIAGNOSIS — D1801 Hemangioma of skin and subcutaneous tissue: Secondary | ICD-10-CM | POA: Diagnosis not present

## 2019-12-26 DIAGNOSIS — L814 Other melanin hyperpigmentation: Secondary | ICD-10-CM | POA: Diagnosis not present

## 2020-01-05 MED FILL — SYNTHROID 50 MCG TABLET: 50 | 90 days supply | Qty: 90 | Fill #2

## 2020-01-06 ENCOUNTER — Other Ambulatory Visit: Payer: Self-pay | Admitting: Obstetrics & Gynecology

## 2020-01-06 DIAGNOSIS — Z1231 Encounter for screening mammogram for malignant neoplasm of breast: Secondary | ICD-10-CM

## 2020-01-06 DIAGNOSIS — I4891 Unspecified atrial fibrillation: Secondary | ICD-10-CM | POA: Diagnosis not present

## 2020-01-06 DIAGNOSIS — R0789 Other chest pain: Secondary | ICD-10-CM | POA: Diagnosis not present

## 2020-01-06 DIAGNOSIS — K219 Gastro-esophageal reflux disease without esophagitis: Secondary | ICD-10-CM | POA: Diagnosis not present

## 2020-01-06 MED FILL — PANTOPRAZOLE SOD DR 40 MG T: 40 | 30 days supply | Qty: 30 | Fill #0

## 2020-01-12 ENCOUNTER — Encounter: Payer: Self-pay | Admitting: Nurse Practitioner

## 2020-01-20 ENCOUNTER — Ambulatory Visit: Payer: PPO | Admitting: Obstetrics & Gynecology

## 2020-01-27 MED FILL — FLECAINIDE ACETATE 50 MG TA: 50 | 90 days supply | Qty: 180 | Fill #1

## 2020-02-04 ENCOUNTER — Encounter: Payer: Self-pay | Admitting: Nurse Practitioner

## 2020-02-04 ENCOUNTER — Ambulatory Visit: Payer: PPO | Admitting: Nurse Practitioner

## 2020-02-04 ENCOUNTER — Other Ambulatory Visit (HOSPITAL_COMMUNITY): Payer: Self-pay | Admitting: *Deleted

## 2020-02-04 ENCOUNTER — Telehealth: Payer: Self-pay | Admitting: General Surgery

## 2020-02-04 ENCOUNTER — Telehealth (HOSPITAL_COMMUNITY): Payer: Self-pay | Admitting: *Deleted

## 2020-02-04 ENCOUNTER — Other Ambulatory Visit (INDEPENDENT_AMBULATORY_CARE_PROVIDER_SITE_OTHER): Payer: PPO

## 2020-02-04 VITALS — BP 90/60 | HR 72 | Ht 62.5 in | Wt 107.0 lb

## 2020-02-04 DIAGNOSIS — R0789 Other chest pain: Secondary | ICD-10-CM | POA: Diagnosis not present

## 2020-02-04 DIAGNOSIS — R079 Chest pain, unspecified: Secondary | ICD-10-CM | POA: Diagnosis not present

## 2020-02-04 DIAGNOSIS — I480 Paroxysmal atrial fibrillation: Secondary | ICD-10-CM

## 2020-02-04 DIAGNOSIS — M8580 Other specified disorders of bone density and structure, unspecified site: Secondary | ICD-10-CM

## 2020-02-04 LAB — CBC WITH DIFFERENTIAL/PLATELET
Basophils Absolute: 0.1 10*3/uL (ref 0.0–0.1)
Basophils Relative: 1.2 % (ref 0.0–3.0)
Eosinophils Absolute: 0.2 10*3/uL (ref 0.0–0.7)
Eosinophils Relative: 2.6 % (ref 0.0–5.0)
HCT: 39.8 % (ref 36.0–46.0)
Hemoglobin: 13.6 g/dL (ref 12.0–15.0)
Lymphocytes Relative: 30.5 % (ref 12.0–46.0)
Lymphs Abs: 2.2 10*3/uL (ref 0.7–4.0)
MCHC: 34.2 g/dL (ref 30.0–36.0)
MCV: 96.4 fl (ref 78.0–100.0)
Monocytes Absolute: 0.6 10*3/uL (ref 0.1–1.0)
Monocytes Relative: 7.8 % (ref 3.0–12.0)
Neutro Abs: 4.2 10*3/uL (ref 1.4–7.7)
Neutrophils Relative %: 57.9 % (ref 43.0–77.0)
Platelets: 200 10*3/uL (ref 150.0–400.0)
RBC: 4.13 Mil/uL (ref 3.87–5.11)
RDW: 12.8 % (ref 11.5–15.5)
WBC: 7.3 10*3/uL (ref 4.0–10.5)

## 2020-02-04 LAB — COMPREHENSIVE METABOLIC PANEL
ALT: 23 U/L (ref 0–35)
AST: 28 U/L (ref 0–37)
Albumin: 4.6 g/dL (ref 3.5–5.2)
Alkaline Phosphatase: 79 U/L (ref 39–117)
BUN: 19 mg/dL (ref 6–23)
CO2: 29 mEq/L (ref 19–32)
Calcium: 9.5 mg/dL (ref 8.4–10.5)
Chloride: 99 mEq/L (ref 96–112)
Creatinine, Ser: 0.68 mg/dL (ref 0.40–1.20)
GFR: 85.21 mL/min (ref >60.00)
Glucose, Bld: 90 mg/dL (ref 70–99)
Potassium: 4.2 mEq/L (ref 3.5–5.1)
Sodium: 136 mEq/L (ref 135–145)
Total Bilirubin: 0.5 mg/dL (ref 0.2–1.2)
Total Protein: 7.4 g/dL (ref 6.0–8.3)

## 2020-02-04 LAB — VITAMIN D 25 HYDROXY (VIT D DEFICIENCY, FRACTURES): VITD: 88.97 ng/mL (ref 30.00–100.00)

## 2020-02-04 LAB — TSH: TSH: 1.75 u[IU]/mL (ref 0.35–4.50)

## 2020-02-04 NOTE — Telephone Encounter (Signed)
[facility] Pre-operative Risk Assessment     Request for surgical clearance:     Endoscopy Procedure  What type of surgery is being performed?    EGD  When is this surgery scheduled?     03/03/2020  What type of clearance is required ?   Pharmacy/Cardiology  Are there any medications that need to be held prior to surgery and how long? Eliquis for 2 days.  The patient is having atypical Chest pain and requires an evaluation and clearance to proceed with her procedure.  Practice name and name of physician performing surgery?      [facility]  What is your office phone and fax number?      Phone- [phone]  [phone]  Anesthesia type (None, local, MAC, general) ?       MAC

## 2020-02-04 NOTE — Telephone Encounter (Signed)
Pt called stating she needs endoscopy but GI wants cardiac clearance prior to procedure. She has been having atypical chest pain recently. Per Roderic Palau NP will need general cardiology referral -- placed and let the pt know the office will call. Pt states procedure is scheduled for 03/03/20.

## 2020-02-04 NOTE — Progress Notes (Signed)
Reviewed and agree with management plan. Screening colonoscopy in April 2027 and review health status at that time to determine if appropriate.   Pricilla Riffle. Fuller Plan, MD [facility]

## 2020-02-04 NOTE — Patient Instructions (Signed)
If you are age 71 or older, your body mass index should be between 23-30. Your Body mass index is 19.26 kg/m. If this is out of the aforementioned range listed, please consider follow up with your Primary Care Provider.  If you are age 65 or younger, your body mass index should be between 19-25. Your Body mass index is 19.26 kg/m. If this is out of the aformentioned range listed, please consider follow up with your Primary Care Provider.   Your provider has requested that you go to the basement level for lab work before leaving today. Press "B" on the elevator. The lab is located at the first door on the left as you exit the elevator.  Reduce you caffine to 1 cup daily Call office if your symptoms worsen To ED if you develop worsening chest pain.  Due to recent changes in healthcare laws, you may see the results of your imaging and laboratory studies on MyChart before your provider has had a chance to review them.  We understand that in some cases there may be results that are confusing or concerning to you. Not all laboratory results come back in the same time frame and the provider may be waiting for multiple results in order to interpret others.  Please give [place] 48 hours in order for your provider to thoroughly review all the results before contacting the office for clarification of your results.   Thank you for choosing [facility] Noralyn Pick, CRNP

## 2020-02-04 NOTE — Progress Notes (Signed)
02/04/2020 Terri [name] [id number] 1950/09/24   CHIEF COMPLAINT: chest pain   HISTORY OF PRESENT ILLNESS:  Terri [name] is a 69 year old female with a past medical history of arthritis, depression, hypothyroidism and atrial fibrillation on Eliquis s/p ablation 2015. S/P abdominal hysterectomy age 35. She was referred to [facility] by her PCP Dr. Oletta Lamas, however, the patient elected to transition he GI care to [facility]. She presents today for further evaluation for mid chest pain that sometimes radiates to the right chest which initially started approximately one year ago. She reports her chest pain has worsened over the past 4 months.  She describes a mid chest burning pain, sometimes the pain is sharp.  Eating does not trigger, worsen or improve her chest pain.  Her chest pain sometimes interferes with exercise, sometimes feels it is difficult to take a deep breath then during exercise. No cough or hemoptysis. No dysphagia. No nausea or vomiting. No NSAID use. She saw her PCP 01/06/2020 due to having atypical chest pain. She was prescribed Pantoprazole 40mg  once daily for possible GERD. Her chest pain did not improve after one week on Pantoprazole so she stopped taking it. She is reluctant to take a PPI unless necessary as she is concerned regarding the risk of osteoporosis associated with PPI use. GI consult was recommended. She is followed by cardiology NP Roderic Palau with Dr. Rayann Heman at the atrial fib clinic every 6 months. Her most recent follow up appointment in the atrial fib clinic was 09/26/2019, no reports of chest pain or SOB were noted at that time. She underwent a normal myocardial perfusion study 06/14/2018.  She underwent a chest x-ray recently which she reported showed "a little inflammation" but no treatment was required.  I do not have access to her chest x-ray in epic.  She drinks 2 cups of coffee daily. Her chest pain has actually improved over the past few weeks. No  current chest pain at this time.  Her atrial fibrillation is well controlled on Eliquis and Flecainide.  She infrequently feels her heart racing for a few seconds, last occurred 2 or 3 weeks ago. She denies having anxiety.  No significant change in her stress level.  She denies having any nausea or vomiting.  No dysphagia.  No upper or lower abdominal pain.  She is passing 2-3 formed brown bowel movements daily.  No rectal bleeding or melena.  She underwent 3 colonoscopies in her lifetime.  Her most recent colonoscopy by Dr. Oletta Lamas was 11/10/2015 which was normal, the bowel prep was documented as adequate to detect polyps.  Mother died from lung cancer with brain mets.  Her father died from esophageal cancer, he was a smoker and drinker.  No family history of colon cancer.  Past Medical History:  Diagnosis Date  . Arthritis    lt shoulder  . Atrial flutter ([place])   . Depression   . Dyspareunia   . Dysrhythmia    a fib  . Fibroid   . Heart murmur   . Hypothyroidism   . Osteoporosis May, 2017  . Paroxysmal atrial fibrillation [facility])    Past Surgical History:  Procedure Laterality Date  . ABDOMINAL HYSTERECTOMY    . ABLATION  12-23-2013   PVI and CTI by Dr Rayann Heman  . ATRIAL FIBRILLATION ABLATION N/A 12/23/2013   Procedure: ATRIAL FIBRILLATION ABLATION;  Surgeon: Coralyn Mark, MD;  Location: [place] CATH LAB;  Service: Cardiovascular;  Laterality: N/A;  .  AUGMENTATION MAMMAPLASTY Bilateral   . BREAST ENHANCEMENT SURGERY  83,11  . COSMETIC SURGERY  2003   face  . FLEXIBLE SIGMOIDOSCOPY N/A 07/29/2013   Procedure: FLEXIBLE SIGMOIDOSCOPY;  Surgeon: [name], MD;  [facility] ENDOSCOPY;  Service: Endoscopy;  Laterality: N/A;  . FRACTURE SURGERY Right    wrist  . TEE WITHOUT CARDIOVERSION N/A 12/22/2013   Procedure: TRANSESOPHAGEAL ECHOCARDIOGRAM (TEE);  Surgeon: [name], MD;  Location: [facility] ENDOSCOPY;  Service: Cardiovascular;  Laterality: N/A;  . TONSILLECTOMY    . TOTAL SHOULDER  ARTHROPLASTY Left 04/23/2015   Procedure: LEFT TOTAL SHOULDER ARTHROPLASTY;  Surgeon: Netta Cedars, MD;  Location: [place];  Service: Orthopedics;  Laterality: Left;    Social History: She is married. Retired. She has one son one daughter. Nonsmoker. She drinks 1 glass of white wine every evening. No history of drug use.   Family History:  Mother died age 63 lung cancer with brain metastasis, history of thyroid disease.  Father died age 39 from esophageal cancer, prior smoker and a heavy drinking. Paternal grandmother died from breast cancer. Maternal Niece died from breast age 45. Maternal grandmother with history of a stroke. Paternal grandmother had an AAA.   No Known Allergies    Outpatient Encounter Medications as of 02/04/2020  Medication Sig  . acetaminophen (TYLENOL) 325 MG tablet Take by mouth as needed for mild pain or moderate pain.   . Calcium Carbonate-Vitamin D (CALCIUM-VITAMIN D) 600-125 MG-UNIT TABS Take 1 tablet by mouth 2 (two) times daily.  Marland Kitchen ELIQUIS 5 MG TABS tablet TAKE 1 TABLET BY MOUTH TWICE A DAY  . flecainide (TAMBOCOR) 50 MG tablet Take 1 tablet (50 mg total) by mouth 2 (two) times daily.  Marland Kitchen levothyroxine (SYNTHROID, LEVOTHROID) 50 MCG tablet Take 50 mcg by mouth daily.  . sertraline (ZOLOFT) 100 MG tablet Take 0.5 tablets by mouth daily.  . traZODone (DESYREL) 50 MG tablet Take 25-50 mg by mouth at bedtime.   . tretinoin (RETIN-A) 0.05 % cream Apply 1 application topically at bedtime.   No facility-administered encounter medications on file as of 02/04/2020.     REVIEW OF SYSTEMS:  Gen: Denies fever, sweats or chills. No weight loss.  CV: Denies chest pain, palpitations or edema. Resp:+ SOB with exercise. Denies cough or hemoptysis.  GI: Denies heartburn, dysphagia, stomach or lower abdominal pain. No diarrhea or constipation.  GU : Denies urinary burning, blood in urine, increased urinary frequency or incontinence. MS: Denies joint pain, muscles aches or  weakness. Derm: Denies rash, itchiness, skin lesions or unhealing ulcers. Psych: Denies depression, anxiety, memory loss, suicidal ideation and confusion. Heme: Denies bruising, bleeding. Neuro:  Denies headaches, dizziness or paresthesias. Endo:  Denies any problems with DM, thyroid or adrenal function.    PHYSICAL EXAM: LMP 08/08/1983 (Approximate)   BP 90/60 (BP Location: Left Arm, Patient Position: Sitting, Cuff Size: Normal)   Pulse 72   Ht 5' 2.5" (1.588 m) Comment: height measured without shoes  Wt 107 lb (48.5 kg)   LMP 08/08/1983 (Approximate)   BMI 19.26 kg/m   General: Well developed 69 year old female in no acute distress. Head: Normocephalic and atraumatic. Eyes:  Sclerae non-icteric, conjunctive pink. Ears: Normal auditory acuity. Mouth: Dentition intact. No ulcers or lesions.  Neck: Supple, no lymphadenopathy or thyromegaly.  Lungs: Clear bilaterally to auscultation without wheezes, crackles or rhonchi. Heart: Regular rate and rhythm. No murmur, rub or gallop appreciated.  Abdomen: Soft, nontender, non distended. No masses. No hepatosplenomegaly.  Normoactive bowel sounds x 4 quadrants.  Rectal: Deferred. Musculoskeletal: Symmetrical with no gross deformities. Skin: Warm and dry. No rash or lesions on visible extremities. Extremities: No edema. Neurological: Alert oriented x 4, no focal deficits.  Psychological:  Alert and cooperative. Normal mood and affect.  ASSESSMENT AND PLAN:  59. 69 year old female with atypical chest pain initially started 1 year ago, worsened over the past 4 months then improving over the past few weeks. Father with history of esophageal cancer.  -TSH -Cardiac clearance due to atypical chest pain with Dr. Rayann Heman prior to proceeding with EGD -Our office will contact Dr. Jackalyn Lombard office to verify Eliquis instructions prior to EGD  -EGD to rule out esophagitis/UGI malignancy. EGD benefits and risks discussed including risk with sedation,  risk of  bleeding, perforation and infection  -She prefers not to take PPI unless necessary, concerned about PPI use and risk of osteoporosis -CBC, CMP, TSH, Vitamin D level -Our office will contact Dr. Jackalyn Lombard office to verify Eliquis instructions prior to EGD  -Recommend chest CT, defer to PCP -Patient to present to the emergency room if she develops severe chest pain or shortness of breath -Further follow-up to be determined after the above evaluation completed  2. Paroxysmal atrial fibrillation well controlled on Flecainide, CHAD2DS2VASc score 2 on Eliquis.   3. Colon cancer screening. Patient reports 3 colonoscopies in her life time, no polyps. Last colonoscopy 11/2015 by [facility] Dr. Oletta Lamas, no polyps, bowel prep adequate. -Dr. Fuller Plan to verify recommended colonoscopy recall date   CC:  Josetta Huddle, MD

## 2020-02-06 NOTE — Telephone Encounter (Signed)
Will route to our pharmacy team for guidance on anticoagulation as per protocol.

## 2020-02-06 NOTE — Telephone Encounter (Signed)
Patient with diagnosis of afib on Eliquis for anticoagulation.    Procedure: EGD Date of procedure: 03/03/20  CHADS2-VASc score of  2 ( AGE, female)  CrCl 58 ml/min  Per office protocol, patient can hold Elqiuis for 2 days prior to procedure.

## 2020-02-12 ENCOUNTER — Encounter: Payer: Self-pay | Admitting: Cardiology

## 2020-02-12 NOTE — Progress Notes (Signed)
Cardiology Office Note   Date:  02/13/2020   ID:  Terri [name], Terri [name] 1951/07/20, MRN [id number]  PCP:  Josetta Huddle, MD  Cardiologist:   Thompson Grayer, MD   Chief Complaint  Patient presents with  . Atrial Fibrillation      History of Present Illness: Terri [name] is a 69 y.o. female who presents for pre procedure clearance prior to endoscopy.  She has atrial fib and has been seen in the atrial fib clinic.  She is seen by Dr. Rayann Heman and the atrial fib clinic. Marland Kitchen   She has had ablation.    She is going to have an EGD because she has been having some chest discomfort.  This is been somewhat better.  It discomfort and moves around her chest pain is under the right breast and mid chest.  Does not happen with exercise.  She walks 5 times a week 5 miles.  With this she does not bring on any symptoms although she may have a little vague discomfort at the beginning this certainly does not persist that she gets her heart rate up increases her speed.  She had negative treadmill in 2016 and 2019.  She has occasional flutters that are short-lived and nothing like when she had her more sustained paroxysms.   Past Medical History:  Diagnosis Date  . Arthritis    lt shoulder  . Atrial flutter ([name])   . Depression   . Dyspareunia   . Fibroid   . Hypothyroidism   . Osteoporosis May, 2017  . Paroxysmal atrial fibrillation [facility])     Past Surgical History:  Procedure Laterality Date  . ABDOMINAL HYSTERECTOMY    . ABLATION  12-23-2013   PVI and CTI by Dr Rayann Heman  . ATRIAL FIBRILLATION ABLATION N/A 12/23/2013   Procedure: ATRIAL FIBRILLATION ABLATION;  Surgeon: Coralyn Mark, MD;  Location: [place] CATH LAB;  Service: Cardiovascular;  Laterality: N/A;  . AUGMENTATION MAMMAPLASTY Bilateral   . BREAST ENHANCEMENT SURGERY  83,11  . COSMETIC SURGERY  2003   face  . FLEXIBLE SIGMOIDOSCOPY N/A 07/29/2013   Procedure: FLEXIBLE SIGMOIDOSCOPY;  Surgeon: [name], MD;   Location: [facility] ENDOSCOPY;  Service: Endoscopy;  Laterality: N/A;  . FRACTURE SURGERY Right    wrist  . TEE WITHOUT CARDIOVERSION N/A 12/22/2013   Procedure: TRANSESOPHAGEAL ECHOCARDIOGRAM (TEE);  Surgeon: [name], MD;  Location: [facility] ENDOSCOPY;  Service: Cardiovascular;  Laterality: N/A;  . TONSILLECTOMY    . TOTAL SHOULDER ARTHROPLASTY Left 04/23/2015   Procedure: LEFT TOTAL SHOULDER ARTHROPLASTY;  Surgeon: Netta Cedars, MD;  Location: [place];  Service: Orthopedics;  Laterality: Left;     Current Outpatient Medications  Medication Sig Dispense Refill  . acetaminophen (TYLENOL) 325 MG tablet Take by mouth as needed for mild pain or moderate pain.     Marland Kitchen AMBULATORY NON FORMULARY MEDICATION Vitamin A, D, K Take 1 capsule daily    . Calcium Carbonate-Vitamin D (CALCIUM-VITAMIN D) 600-125 MG-UNIT TABS Take 1 tablet by mouth 2 (two) times daily.    Marland Kitchen ELIQUIS 5 MG TABS tablet TAKE 1 TABLET BY MOUTH TWICE A DAY 180 tablet 2  . flecainide (TAMBOCOR) 50 MG tablet Take 1 tablet (50 mg total) by mouth 2 (two) times daily. 180 tablet 1  . levothyroxine (SYNTHROID, LEVOTHROID) 50 MCG tablet Take 50 mcg by mouth daily.    . sertraline (ZOLOFT) 100 MG tablet Take 0.5 tablets by mouth daily.  2  . traZODone (DESYREL)  50 MG tablet Take 25-50 mg by mouth at bedtime.     . tretinoin (RETIN-A) 0.05 % cream Apply 1 application topically at bedtime.     No current facility-administered medications for this visit.    Allergies:   Patient has no known allergies.     ROS:  Please see the history of present illness.   Otherwise, review of systems are positive for none.   All other systems are reviewed and negative.    PHYSICAL EXAM: VS:  BP 120/90   Pulse 61   Temp (!) 95.7 F (35.4 C)   Ht 5\' 2"  (1.575 m)   Wt 107 lb 3.2 oz (48.6 kg)   LMP 08/08/1983 (Approximate)   SpO2 96%   BMI 19.61 kg/m  , BMI Body mass index is 19.61 kg/m. GENERAL:  Well appearing HEENT:  Pupils equal round and reactive,  fundi not visualized, oral mucosa unremarkable NECK:  No jugular venous distention, waveform within normal limits, carotid upstroke brisk and symmetric, no bruits, no thyromegaly LYMPHATICS:  No cervical, inguinal adenopathy LUNGS:  Clear to auscultation bilaterally BACK:  No CVA tenderness CHEST:  Unremarkable HEART:  PMI not displaced or sustained,S1 and S2 within normal limits, no S3, no S4, no clicks, no rubs, no murmurs ABD:  Flat, positive bowel sounds normal in frequency in pitch, no bruits, no rebound, no guarding, no midline pulsatile mass, no hepatomegaly, no splenomegaly EXT:  2 plus pulses throughout, no edema, no cyanosis no clubbing SKIN:  No rashes no nodules NEURO:  Cranial nerves II through XII grossly intact, motor grossly intact throughout PSYCH:  Cognitively intact, oriented to person place and time    EKG:  EKG is ordered today. The ekg ordered today demonstrates sinus rhythm, rate 61, axis within normal limits, intervals within normal limits, no acute ST-T wave changes.   Recent Labs: 02/04/2020: ALT 23; BUN 19; Creatinine, Ser 0.68; Hemoglobin 13.6; Platelets 200.0; Potassium 4.2; Sodium 136; TSH 1.75    Lipid Panel    Component Value Date/Time   CHOL 250 (H) 09/22/2013 0832   TRIG 59.0 09/22/2013 0832   HDL 110.80 09/22/2013 0832   CHOLHDL 2 09/22/2013 0832   VLDL 11.8 09/22/2013 0832   LDLDIRECT 126.3 09/22/2013 0832      Wt Readings from Last 3 Encounters:  02/13/20 107 lb 3.2 oz (48.6 kg)  02/04/20 107 lb (48.5 kg)  09/30/19 111 lb 6.4 oz (50.5 kg)      Other studies Reviewed: Additional studies/ records that were reviewed today include: EP records. Review of the above records demonstrates:  Please see elsewhere in the note.     ASSESSMENT AND PLAN:  PERSISTENT ATRIAL FIB:  Terri [name] has a CHA2DS2 - VASc score of 2.   We talked about the risk and benefits of continuing anticoagulation and flecainide.  We agreed to  continue current therapy since she is having some short paroxysms of some arrhythmia likely.  ABNORMAL ECHO: I did notice that in 2015 she had an echo with apparent mild aortic insufficiency and mitral regurgitation but I do not hear at all clinically and so do not think further imaging is indicated.  PREOP CLEARANCE:  She is going to have EGD.  She is not at risk for this.  I have not instructed her to hold anticoagulation but will send a message to Dr. Avel Peace she could if they felt it necessary..     Current medicines are reviewed at length with the  patient today.  The patient does not have concerns regarding medicines.  The following changes have been made:  no change  Labs/ tests ordered today include: None No orders of the defined types were placed in this encounter.    Disposition:   FU with Dr. Rayann Heman and the Afib Clinic.     Signed, Minus Breeding, MD  02/13/2020 8:54 AM    [facility]

## 2020-02-13 ENCOUNTER — Telehealth: Payer: Self-pay | Admitting: General Surgery

## 2020-02-13 ENCOUNTER — Encounter: Payer: Self-pay | Admitting: Cardiology

## 2020-02-13 ENCOUNTER — Other Ambulatory Visit: Payer: Self-pay

## 2020-02-13 ENCOUNTER — Ambulatory Visit: Payer: PPO | Admitting: Cardiology

## 2020-02-13 VITALS — BP 120/90 | HR 61 | Temp 95.7°F | Ht 62.0 in | Wt 107.2 lb

## 2020-02-13 DIAGNOSIS — I4819 Other persistent atrial fibrillation: Secondary | ICD-10-CM | POA: Diagnosis not present

## 2020-02-13 NOTE — Telephone Encounter (Signed)
Left a message for the patient to contact our office regarding her Eliquis.

## 2020-02-13 NOTE — Patient Instructions (Addendum)
Medication Instructions:  No changes *If you need a refill on your cardiac medications before your next appointment, please call your pharmacy*   Lab Work: None ordered If you have labs (blood work) drawn today and your tests are completely normal, you will receive your results only by: Marland Kitchen MyChart Message (if you have MyChart) OR . A paper copy in the mail If you have any lab test that is abnormal or we need to change your treatment, we will call you to review the results.   Testing/Procedures: None ordered   Follow-Up: At [facility], you and your health needs are our priority.  As part of our continuing mission to provide you with exceptional heart care, we have created designated Provider Care Teams.  These Care Teams include your primary Cardiologist (physician) and Advanced Practice Providers (APPs -  Physician Assistants and Nurse Practitioners) who all work together to provide you with the care you need, when you need it.  We recommend signing up for the patient portal called "MyChart".  Sign up information is provided on this After Visit Summary.  MyChart is used to connect with patients for Virtual Visits (Telemedicine).  Patients are able to view lab/test results, encounter notes, upcoming appointments, etc.  Non-urgent messages can be sent to your provider as well.   To learn more about what you can do with MyChart, go to [email or web address].    Your next appointment:   6 month(s)  The format for your next appointment:   In Person  Provider:   Roderic Palau, NP

## 2020-02-13 NOTE — Telephone Encounter (Signed)
-----   Message from Lindon Romp, [place] sent at 02/13/2020  9:17 AM EDT ----- Regarding: FW:  ----- Message ----- From: Ladene Artist, MD Sent: 02/13/2020   8:57 AM EDT To: Minus Breeding, MD, Lindon Romp, CMA Subject: RE:                                            Luana Shu. ----- Message ----- From: Minus Breeding, MD Sent: 02/13/2020   8:52 AM EDT To: Ladene Artist, MD  Malcom, For some reason this patient was sent to me for pre-EGD clearance.  No contraindications that I could find.  I did not tell her to stop her Eliquis for just an EGD but she certainly could if you thought it necessary.  Just let her know thanks Jake.

## 2020-02-13 NOTE — Telephone Encounter (Signed)
Spoke with Terri [name] and explained she does not need to stop her Eliquis per Dr Percival Spanish. [name] stated she was already to this by Dr Percival Spanish but verbalized understanding  Why we called to advise her.

## 2020-02-16 ENCOUNTER — Ambulatory Visit: Payer: PPO | Admitting: Cardiology

## 2020-02-16 NOTE — Telephone Encounter (Signed)
   Primary Cardiologist: Thompson Grayer, MD  Chart reviewed as part of pre-operative protocol coverage. Per Dr. Percival Spanish who evaluated the patient in office 02/12/20, Hall Busing would be at acceptable risk for the planned procedure without further cardiovascular testing.   Per pharmacy recommendations, patient can hold eliquis 2 days prior to her upcoming EGD and should restart when cleared to do so by her gastroenterologist.   I will route this recommendation to the requesting party via [facility] fax function and remove from pre-op pool.  Please call with questions.  Abigail Butts, PA-C 02/16/2020, 6:54 AM

## 2020-02-18 NOTE — Addendum Note (Signed)
Addended by: Lubertha Sayres on: 02/18/2020 05:04 PM   Modules accepted: Orders

## 2020-02-20 ENCOUNTER — Encounter: Payer: Self-pay | Admitting: Gastroenterology

## 2020-02-23 MED FILL — ELIQUIS 5 MG TABLET: 5 | 90 days supply | Qty: 180 | Fill #2

## 2020-03-03 ENCOUNTER — Encounter: Payer: Self-pay | Admitting: Gastroenterology

## 2020-03-03 ENCOUNTER — Ambulatory Visit (AMBULATORY_SURGERY_CENTER): Payer: PPO | Admitting: Gastroenterology

## 2020-03-03 ENCOUNTER — Other Ambulatory Visit: Payer: Self-pay

## 2020-03-03 VITALS — BP 110/63 | HR 60 | Temp 96.0°F | Resp 9 | Ht 62.0 in | Wt 107.0 lb

## 2020-03-03 DIAGNOSIS — R079 Chest pain, unspecified: Secondary | ICD-10-CM

## 2020-03-03 DIAGNOSIS — K319 Disease of stomach and duodenum, unspecified: Secondary | ICD-10-CM

## 2020-03-03 DIAGNOSIS — K449 Diaphragmatic hernia without obstruction or gangrene: Secondary | ICD-10-CM

## 2020-03-03 DIAGNOSIS — R1013 Epigastric pain: Secondary | ICD-10-CM | POA: Diagnosis not present

## 2020-03-03 DIAGNOSIS — K3189 Other diseases of stomach and duodenum: Secondary | ICD-10-CM | POA: Diagnosis not present

## 2020-03-03 MED ORDER — SODIUM CHLORIDE 0.9 % IV SOLN: 500.0000 mL | INTRAVENOUS | Status: DC

## 2020-03-03 NOTE — Progress Notes (Signed)
Cw vitals and AB Iv.

## 2020-03-03 NOTE — Op Note (Signed)
[facility] Patient Name: Terri [name] Procedure Date: 03/03/2020 10:18 AM MRN: [id number] Endoscopist: Ladene Artist , MD Age: 69 Referring MD:  Date of Birth: 1950/09/21 Gender: Female Account #: [id number] Procedure:                Upper GI endoscopy Indications:              Unexplained chest pain Medicines:                Monitored Anesthesia Care Procedure:                Pre-Anesthesia Assessment:                           - Prior to the procedure, a History and Physical                            was performed, and patient medications and                            allergies were reviewed. The patient's tolerance of                            previous anesthesia was also reviewed. The risks                            and benefits of the procedure and the sedation                            options and risks were discussed with the patient.                            All questions were answered, and informed consent                            was obtained. Prior Anticoagulants: The patient has                            taken Eliquis (apixaban), last dose was day of                            procedure. ASA Grade Assessment: II - A patient                            with mild systemic disease. After reviewing the                            risks and benefits, the patient was deemed in                            satisfactory condition to undergo the procedure.                           After obtaining informed consent, the endoscope was  passed under direct vision. Throughout the                            procedure, the patient's blood pressure, pulse, and                            oxygen saturations were monitored continuously. The                            Endoscope was introduced through the mouth, and                            advanced to the second part of duodenum. The upper                            GI endoscopy was accomplished  without difficulty.                            The patient tolerated the procedure well. Scope In: Scope Out: Findings:                 The examined esophagus was normal.                           A small hiatal hernia was present.                           Patchy mildly erythematous mucosa without bleeding                            was found in the entire examined stomach. Biopsies                            were taken with a cold forceps for histology.                           The exam of the stomach was otherwise normal.                           The duodenal bulb and second portion of the                            duodenum were normal. Complications:            No immediate complications. Estimated Blood Loss:     Estimated blood loss was minimal. Impression:               - Normal esophagus.                           - Small hiatal hernia.                           - Erythematous mucosa in the stomach. Biopsied.                           -  Normal duodenal bulb and second portion of the                            duodenum. Recommendation:           - Patient has a contact number available for                            emergencies. The signs and symptoms of potential                            delayed complications were discussed with the                            patient. Return to normal activities tomorrow.                            Written discharge instructions were provided to the                            patient.                           - Resume previous diet.                           - Continue present medications.                           - Await pathology results. Ladene Artist, MD 03/03/2020 10:37:43 AM This report has been signed electronically.

## 2020-03-03 NOTE — Progress Notes (Signed)
Pt took Eliquis this am, she said her cardiologist told her she didn't need to stop it prior to EGD. Dr. Fuller Plan notified.

## 2020-03-03 NOTE — Patient Instructions (Signed)
Handouts given:  Gastritis, Hiatal hernia Resume previous diet Continue present medications Await pathology results   YOU HAD AN ENDOSCOPIC PROCEDURE TODAY AT [place]:   Refer to the procedure report that was given to you for any specific questions about what was found during the examination.  If the procedure report does not answer your questions, please call your gastroenterologist to clarify.  If you requested that your care partner not be given the details of your procedure findings, then the procedure report has been included in a sealed envelope for you to review at your convenience later.  YOU SHOULD EXPECT: Some feelings of bloating in the abdomen. Passage of more gas than usual.  Walking can help get rid of the air that was put into your GI tract during the procedure and reduce the bloating. If you had a lower endoscopy (such as a colonoscopy or flexible sigmoidoscopy) you may notice spotting of blood in your stool or on the toilet paper. If you underwent a bowel prep for your procedure, you may not have a normal bowel movement for a few days.  Please Note:  You might notice some irritation and congestion in your nose or some drainage.  This is from the oxygen used during your procedure.  There is no need for concern and it should clear up in a day or so.  SYMPTOMS TO REPORT IMMEDIATELY:   Following upper endoscopy (EGD)  Vomiting of blood or coffee ground material  New chest pain or pain under the shoulder blades  Painful or persistently difficult swallowing  New shortness of breath  Fever of 100F or higher  Black, tarry-looking stools  For urgent or emergent issues, a gastroenterologist can be reached at any hour by calling [phone]. Do not use MyChart messaging for urgent concerns.    DIET:  We do recommend a small meal at first, but then you may proceed to your regular diet.  Drink plenty of fluids but you should avoid alcoholic beverages for 24  hours.  ACTIVITY:  You should plan to take it easy for the rest of today and you should NOT DRIVE or use heavy machinery until tomorrow (because of the sedation medicines used during the test).    FOLLOW UP: Our staff will call the number listed on your records 48-72 hours following your procedure to check on you and address any questions or concerns that you may have regarding the information given to you following your procedure. If we do not reach you, we will leave a message.  We will attempt to reach you two times.  During this call, we will ask if you have developed any symptoms of COVID 19. If you develop any symptoms (ie: fever, flu-like symptoms, shortness of breath, cough etc.) before then, please call [phone].  If you test positive for Covid 19 in the 2 weeks post procedure, please call and report this information to [place].    If any biopsies were taken you will be contacted by phone or by letter within the next 1-3 weeks.  Please call us at [phone] if you have not heard about the biopsies in 3 weeks.    SIGNATURES/CONFIDENTIALITY: You and/or your care partner have signed paperwork which will be entered into your electronic medical record.  These signatures attest to the fact that that the information above on your After Visit Summary has been reviewed and is understood.  Full responsibility of the confidentiality of this discharge information lies with you  and/or your care-partner. 

## 2020-03-03 NOTE — Progress Notes (Signed)
A/ox3, pleased with MAC, report to RN 

## 2020-03-03 NOTE — Progress Notes (Signed)
Called to room to assist during endoscopic procedure.  Patient ID and intended procedure confirmed with present staff. Received instructions for my participation in the procedure from the performing physician.  

## 2020-03-05 ENCOUNTER — Telehealth: Payer: Self-pay

## 2020-03-05 NOTE — Telephone Encounter (Signed)
  Follow up Call-  Call back number 03/03/2020  Post procedure Call Back phone  # [phone]  Permission to leave phone message Yes  Some recent data might be hidden     Patient questions:  Do you have a fever, pain , or abdominal swelling? No. Pain Score  0 *  Have you tolerated food without any problems? Yes.    Have you been able to return to your normal activities? Yes.    Do you have any questions about your discharge instructions: Diet   No. Medications  No. Follow up visit  No.  Do you have questions or concerns about your Care? No.  Actions: * If pain score is 4 or above: No action needed, pain <4.  1. Have you developed a fever since your procedure? no  2.   Have you had an respiratory symptoms (SOB or cough) since your procedure? no  3.   Have you tested positive for COVID 19 since your procedure no  4.   Have you had any family members/close contacts diagnosed with the COVID 19 since your procedure?  no   If yes to any of these questions please route to Joylene John, RN and Erenest Rasher, RN

## 2020-03-14 ENCOUNTER — Encounter: Payer: Self-pay | Admitting: Gastroenterology

## 2020-03-16 ENCOUNTER — Other Ambulatory Visit: Payer: Self-pay | Admitting: Obstetrics & Gynecology

## 2020-03-16 ENCOUNTER — Ambulatory Visit
Admission: RE | Admit: 2020-03-16 | Discharge: 2020-03-16 | Disposition: A | Discharge status: Home / Self Care | Payer: PPO | Source: Ambulatory Visit | Attending: Obstetrics & Gynecology | Admitting: Obstetrics & Gynecology

## 2020-03-16 ENCOUNTER — Other Ambulatory Visit: Payer: Self-pay

## 2020-03-16 DIAGNOSIS — Z1231 Encounter for screening mammogram for malignant neoplasm of breast: Secondary | ICD-10-CM

## 2020-03-16 DIAGNOSIS — M8589 Other specified disorders of bone density and structure, multiple sites: Secondary | ICD-10-CM | POA: Diagnosis not present

## 2020-03-16 DIAGNOSIS — Z780 Asymptomatic menopausal state: Secondary | ICD-10-CM | POA: Diagnosis not present

## 2020-03-16 DIAGNOSIS — M816 Localized osteoporosis [Lequesne]: Secondary | ICD-10-CM

## 2020-03-17 ENCOUNTER — Other Ambulatory Visit: Payer: Self-pay | Admitting: Obstetrics & Gynecology

## 2020-03-17 DIAGNOSIS — R928 Other abnormal and inconclusive findings on diagnostic imaging of breast: Secondary | ICD-10-CM

## 2020-03-22 DIAGNOSIS — M26622 Arthralgia of left temporomandibular joint: Secondary | ICD-10-CM | POA: Diagnosis not present

## 2020-03-22 DIAGNOSIS — M810 Age-related osteoporosis without current pathological fracture: Secondary | ICD-10-CM | POA: Diagnosis not present

## 2020-03-22 DIAGNOSIS — Z79899 Other long term (current) drug therapy: Secondary | ICD-10-CM | POA: Diagnosis not present

## 2020-03-22 DIAGNOSIS — G4730 Sleep apnea, unspecified: Secondary | ICD-10-CM | POA: Diagnosis not present

## 2020-03-22 DIAGNOSIS — L659 Nonscarring hair loss, unspecified: Secondary | ICD-10-CM | POA: Diagnosis not present

## 2020-03-22 DIAGNOSIS — I4891 Unspecified atrial fibrillation: Secondary | ICD-10-CM | POA: Diagnosis not present

## 2020-03-22 DIAGNOSIS — F3342 Major depressive disorder, recurrent, in full remission: Secondary | ICD-10-CM | POA: Diagnosis not present

## 2020-03-22 DIAGNOSIS — D6869 Other thrombophilia: Secondary | ICD-10-CM | POA: Diagnosis not present

## 2020-03-22 DIAGNOSIS — E559 Vitamin D deficiency, unspecified: Secondary | ICD-10-CM | POA: Diagnosis not present

## 2020-03-22 DIAGNOSIS — Z0001 Encounter for general adult medical examination with abnormal findings: Secondary | ICD-10-CM | POA: Diagnosis not present

## 2020-03-22 DIAGNOSIS — E039 Hypothyroidism, unspecified: Secondary | ICD-10-CM | POA: Diagnosis not present

## 2020-03-22 DIAGNOSIS — K589 Irritable bowel syndrome without diarrhea: Secondary | ICD-10-CM | POA: Diagnosis not present

## 2020-03-25 MED FILL — CYCLOBENZAPRINE HCL 5 MG TA: 5 | 14 days supply | Qty: 14 | Fill #0

## 2020-03-26 ENCOUNTER — Other Ambulatory Visit: Payer: Self-pay

## 2020-03-26 ENCOUNTER — Ambulatory Visit
Admission: RE | Admit: 2020-03-26 | Discharge: 2020-03-26 | Disposition: A | Discharge status: Home / Self Care | Payer: PPO | Source: Ambulatory Visit | Attending: Obstetrics & Gynecology | Admitting: Obstetrics & Gynecology

## 2020-03-26 ENCOUNTER — Other Ambulatory Visit: Payer: Self-pay | Admitting: Obstetrics & Gynecology

## 2020-03-26 ENCOUNTER — Other Ambulatory Visit (HOSPITAL_COMMUNITY): Payer: Self-pay | Admitting: Internal Medicine

## 2020-03-26 ENCOUNTER — Ambulatory Visit: Payer: PPO

## 2020-03-26 DIAGNOSIS — R928 Other abnormal and inconclusive findings on diagnostic imaging of breast: Secondary | ICD-10-CM

## 2020-03-26 MED FILL — SYNTHROID 50 MCG TABLET: 50 | 90 days supply | Qty: 90 | Fill #0

## 2020-03-29 ENCOUNTER — Ambulatory Visit (HOSPITAL_COMMUNITY): Payer: PPO | Admitting: Nurse Practitioner

## 2020-04-05 MED FILL — traZODone HCL 50 MG TABS: 50 | 90 days supply | Qty: 90 | Fill #2

## 2020-04-26 DIAGNOSIS — Z961 Presence of intraocular lens: Secondary | ICD-10-CM | POA: Diagnosis not present

## 2020-04-27 ENCOUNTER — Ambulatory Visit: Payer: PPO

## 2020-04-28 ENCOUNTER — Other Ambulatory Visit (HOSPITAL_COMMUNITY): Payer: Self-pay | Admitting: Nurse Practitioner

## 2020-04-28 ENCOUNTER — Other Ambulatory Visit (HOSPITAL_COMMUNITY): Payer: Self-pay | Admitting: *Deleted

## 2020-04-28 MED ORDER — FLECAINIDE ACETATE 50 MG PO TABS: 50.0000 mg | ORAL_TABLET | Freq: Two times a day (BID) | ORAL | 1 refills | Status: DC

## 2020-04-28 MED FILL — FLECAINIDE ACETATE 50 MG TA: 50 | 90 days supply | Qty: 180 | Fill #0

## 2020-05-25 ENCOUNTER — Other Ambulatory Visit: Payer: Self-pay | Admitting: Internal Medicine

## 2020-05-25 NOTE — Telephone Encounter (Signed)
Prescription refill request for Eliquis received.  Last office visit: H[place], 02/13/2020 Scr: 0.68, 02/04/2020 Age: 69 y.o. Weight: 48.5 kg   Prescription refill sent.

## 2020-05-26 ENCOUNTER — Other Ambulatory Visit: Payer: Self-pay | Admitting: Nurse Practitioner

## 2020-05-26 MED FILL — ELIQUIS 5 MG TABLET: 5 | 90 days supply | Qty: 180 | Fill #0

## 2020-07-07 MED FILL — SYNTHROID 50 MCG TABLET: 50 | 90 days supply | Qty: 90 | Fill #1

## 2020-07-09 ENCOUNTER — Other Ambulatory Visit (HOSPITAL_COMMUNITY): Payer: Self-pay | Admitting: Internal Medicine

## 2020-07-09 MED FILL — SERTRALINE HCL 100 MG TABS: 100 | 90 days supply | Qty: 135 | Fill #0

## 2020-07-29 MED FILL — FLECAINIDE ACETATE 50 MG TA: 50 | 90 days supply | Qty: 180 | Fill #1

## 2020-08-03 DIAGNOSIS — L282 Other prurigo: Secondary | ICD-10-CM | POA: Diagnosis not present

## 2020-08-11 DIAGNOSIS — M795 Residual foreign body in soft tissue: Secondary | ICD-10-CM | POA: Diagnosis not present

## 2020-08-11 DIAGNOSIS — M79644 Pain in right finger(s): Secondary | ICD-10-CM | POA: Diagnosis not present

## 2020-08-13 ENCOUNTER — Other Ambulatory Visit: Payer: Self-pay | Admitting: Orthopedic Surgery

## 2020-08-13 DIAGNOSIS — M79644 Pain in right finger(s): Secondary | ICD-10-CM

## 2020-08-13 DIAGNOSIS — M795 Residual foreign body in soft tissue: Secondary | ICD-10-CM

## 2020-08-22 MED FILL — ELIQUIS 5 MG TABLET: 5 | 90 days supply | Qty: 180 | Fill #1

## 2020-08-23 ENCOUNTER — Other Ambulatory Visit (HOSPITAL_COMMUNITY): Payer: Self-pay | Admitting: Internal Medicine

## 2020-08-23 MED FILL — traZODone HCL 50 MG TABS: 50 | 90 days supply | Qty: 90 | Fill #0

## 2020-08-24 ENCOUNTER — Other Ambulatory Visit (HOSPITAL_COMMUNITY): Payer: Self-pay | Admitting: Dermatology

## 2020-08-26 ENCOUNTER — Other Ambulatory Visit (HOSPITAL_COMMUNITY): Payer: Self-pay | Admitting: Internal Medicine

## 2020-08-26 DIAGNOSIS — R059 Cough, unspecified: Secondary | ICD-10-CM | POA: Diagnosis not present

## 2020-08-31 ENCOUNTER — Other Ambulatory Visit (HOSPITAL_COMMUNITY): Payer: Self-pay | Admitting: Internal Medicine

## 2020-08-31 ENCOUNTER — Ambulatory Visit
Admission: RE | Admit: 2020-08-31 | Discharge: 2020-08-31 | Disposition: A | Discharge status: Home / Self Care | Payer: PPO | Source: Ambulatory Visit | Attending: Orthopedic Surgery | Admitting: Orthopedic Surgery

## 2020-08-31 DIAGNOSIS — S6991XA Unspecified injury of right wrist, hand and finger(s), initial encounter: Secondary | ICD-10-CM | POA: Diagnosis not present

## 2020-08-31 DIAGNOSIS — M79644 Pain in right finger(s): Secondary | ICD-10-CM

## 2020-08-31 DIAGNOSIS — M795 Residual foreign body in soft tissue: Secondary | ICD-10-CM

## 2020-08-31 MED FILL — HYDROCOD-APAP 7.5-325/15ML: 7.5-325 | 7 days supply | Qty: 140 | Fill #0

## 2020-09-01 ENCOUNTER — Other Ambulatory Visit: Payer: Self-pay

## 2020-09-01 ENCOUNTER — Ambulatory Visit (HOSPITAL_COMMUNITY)
Admission: RE | Admit: 2020-09-01 | Discharge: 2020-09-01 | Disposition: A | Discharge status: Home / Self Care | Payer: PPO | Source: Ambulatory Visit | Attending: Nurse Practitioner | Admitting: Nurse Practitioner

## 2020-09-01 VITALS — BP 106/70 | HR 66 | Ht 62.0 in | Wt 109.2 lb

## 2020-09-01 DIAGNOSIS — I4819 Other persistent atrial fibrillation: Secondary | ICD-10-CM | POA: Diagnosis not present

## 2020-09-01 DIAGNOSIS — I440 Atrioventricular block, first degree: Secondary | ICD-10-CM | POA: Insufficient documentation

## 2020-09-01 DIAGNOSIS — I959 Hypotension, unspecified: Secondary | ICD-10-CM | POA: Diagnosis not present

## 2020-09-01 DIAGNOSIS — I480 Paroxysmal atrial fibrillation: Secondary | ICD-10-CM

## 2020-09-01 DIAGNOSIS — Z7901 Long term (current) use of anticoagulants: Secondary | ICD-10-CM | POA: Diagnosis not present

## 2020-09-01 DIAGNOSIS — D6869 Other thrombophilia: Secondary | ICD-10-CM | POA: Diagnosis not present

## 2020-09-01 NOTE — Progress Notes (Signed)
Primary Care Physician: Josetta Huddle, MD Referring Physician:Dr. Drucie Ip Brinckerhoff Terri [name] is a 70 y.o. female with a h/o paroxymal afib that is in the afib clinic for f/u. She is on flecainide and is staying in SR. She does not notice any afib. She does have a low normal BP and is not on any rate control. She is volunteering in the gift show at the hospital now.    Today, she denies symptoms of palpitations, chest pain, shortness of breath, orthopnea, PND, lower extremity edema, dizziness, presyncope, syncope, or neurologic sequela. The patient is tolerating medications without difficulties and is otherwise without complaint today.   Past Medical History:  Diagnosis Date  . Arthritis    lt shoulder  . Atrial flutter ([place])   . Depression   . Dyspareunia   . Fibroid   . Hypothyroidism   . Osteoporosis May, 2017  . Paroxysmal atrial fibrillation [facility])    Past Surgical History:  Procedure Laterality Date  . ABDOMINAL HYSTERECTOMY    . ABLATION  12-23-2013   PVI and CTI by Dr Rayann Heman  . ATRIAL FIBRILLATION ABLATION N/A 12/23/2013   Procedure: ATRIAL FIBRILLATION ABLATION;  Surgeon: Coralyn Mark, MD;  Location: [place] CATH LAB;  Service: Cardiovascular;  Laterality: N/A;  . AUGMENTATION MAMMAPLASTY Bilateral   . BREAST ENHANCEMENT SURGERY  83,11  . COSMETIC SURGERY  2003   face  . FLEXIBLE SIGMOIDOSCOPY N/A 07/29/2013   Procedure: FLEXIBLE SIGMOIDOSCOPY;  Surgeon: [name], MD;  [facility] ENDOSCOPY;  Service: Endoscopy;  Laterality: N/A;  . FRACTURE SURGERY Right    wrist  . TEE WITHOUT CARDIOVERSION N/A 12/22/2013   Procedure: TRANSESOPHAGEAL ECHOCARDIOGRAM (TEE);  Surgeon: [name], MD;  Location[facility] ENDOSCOPY;  Service: Cardiovascular;  Laterality: N/A;  . TONSILLECTOMY    . TOTAL SHOULDER ARTHROPLASTY Left 04/23/2015   Procedure: LEFT TOTAL SHOULDER ARTHROPLASTY;  Surgeon: Netta Cedars, MD;  Location: [place];  Service: Orthopedics;  Laterality: Left;     Current Outpatient Medications  Medication Sig Dispense Refill  . acetaminophen (TYLENOL) 325 MG tablet Take by mouth as needed for mild pain or moderate pain.     Marland Kitchen AMBULATORY NON FORMULARY MEDICATION Vitamin A, D, K Take 1 capsule daily    . Calcium Citrate-Vitamin D (CALCIUM CITRATE + D PO) Take 1 capsule by mouth in the morning and at bedtime.    Marland Kitchen ELIQUIS 5 MG TABS tablet TAKE 1 TABLET BY MOUTH TWICE A DAY 180 tablet 2  . flecainide (TAMBOCOR) 50 MG tablet Take 1 tablet (50 mg total) by mouth 2 (two) times daily. 180 tablet 1  . levothyroxine (SYNTHROID, LEVOTHROID) 50 MCG tablet Take 50 mcg by mouth daily.    . sertraline (ZOLOFT) 100 MG tablet Take 0.5 tablets by mouth daily.  2  . traZODone (DESYREL) 50 MG tablet Take 25-50 mg by mouth at bedtime.     . tretinoin (RETIN-A) 0.05 % cream Apply 1 application topically at bedtime.     No current facility-administered medications for this encounter.    Allergies  Allergen Reactions  . Botox [Onabotulinumtoxina] Other (See Comments)  . Pantoprazole Other (See Comments)    Social History   Socioeconomic History  . Marital status: Married    Spouse name: Not on file  . Number of children: 2  . Years of education: Not on file  . Highest education level: Not on file  Occupational History  . Occupation: retired  Tobacco Use  .  Smoking status: Never Smoker  . Smokeless tobacco: Never Used  Vaping Use  . Vaping Use: Never used  Substance and Sexual Activity  . Alcohol use: Yes    Alcohol/week: 5.0 standard drinks    Types: 5 Glasses of wine per week    Comment: 1 per day  . Drug use: No  . Sexual activity: Not Currently    Partners: Male    Birth control/protection: None, Surgical    Comment: TVH--still has ovaries  Other Topics Concern  . Not on file  Social History Narrative   Pt lives in [place] with spouse.  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   Social Determinants of Health   Financial Resource  Strain: Not on file  Food Insecurity: Not on file  Transportation Needs: Not on file  Physical Activity: Not on file  Stress: Not on file  Social Connections: Not on file  Intimate Partner Violence: Not on file    Family History  Problem Relation Age of Onset  . Hypertension Mother   . Thyroid disease Mother   . Lung cancer Mother   . Breast cancer Paternal Grandmother 75  . Esophageal cancer Father   . Thyroid disease Sister   . Kidney disease Sister   . Depression Son   . Stroke Maternal Grandmother   . Heart disease Maternal Grandmother   . Breast cancer Niece 68  . Stomach cancer Neg Hx   . Rectal cancer Neg Hx   . Colon cancer Neg Hx     ROS- All systems are reviewed and negative except as per the HPI above  Physical Exam: Vitals:   09/01/20 1027  BP: 106/70  Pulse: 66  Weight: 49.5 kg  Height: 5\' 2"  (1.575 m)   Wt Readings from Last 3 Encounters:  09/01/20 49.5 kg  03/03/20 48.5 kg  02/13/20 48.6 kg    Labs: Lab Results  Component Value Date   NA 136 02/04/2020   K 4.2 02/04/2020   CL 99 02/04/2020   CO2 29 02/04/2020   GLUCOSE 90 02/04/2020   BUN 19 02/04/2020   CREATININE 0.68 02/04/2020   CALCIUM 9.5 02/04/2020   Lab Results  Component Value Date   INR 1.03 04/14/2015   Lab Results  Component Value Date   CHOL 250 (H) 09/22/2013   HDL 110.80 09/22/2013   TRIG 59.0 09/22/2013     GEN- The patient is well appearing, alert and oriented x 3 today.   Head- normocephalic, atraumatic Eyes-  Sclera clear, conjunctiva pink Ears- hearing intact Oropharynx- clear Neck- supple, no JVP Lymph- no cervical lymphadenopathy Lungs- Clear to ausculation bilaterally, normal work of breathing Heart- Regular rate and rhythm, no murmurs, rubs or gallops, PMI not laterally displaced GI- soft, NT, ND, + BS Extremities- no clubbing, cyanosis, or edema MS- no significant deformity or atrophy Skin- no rash or lesion Psych- euthymic mood, full  affect Neuro- strength and sensation are intact  EKG-SR with first degree AV block, pr int 212 ms, qrs int 90 ms, qtc 425  ms Epic records reviewed    Assessment and Plan: 1. Paroxysmal  afib She is not aware of any afib Continue flecainide  50 mg bid, not on rate control for low normal BP   2. CHA2DS2VASc score of 2 By newest guidelines, pt does not require daily anticoagulation We discussed this in detail She is tolerating well and wants to continue at this point  She is on the correct does of eliquis 5  mg bid   3. Hypotension Pt's norm Asymptomatic   F/u in 6 months  Butch Penny C. Maicie Vanderloop, [facility] [address] [place], [place] [address] [phone]

## 2020-09-06 ENCOUNTER — Ambulatory Visit
Admission: RE | Admit: 2020-09-06 | Discharge: 2020-09-06 | Disposition: A | Discharge status: Home / Self Care | Payer: PPO | Source: Ambulatory Visit | Attending: Internal Medicine | Admitting: Internal Medicine

## 2020-09-06 ENCOUNTER — Other Ambulatory Visit: Payer: Self-pay | Admitting: Internal Medicine

## 2020-09-06 DIAGNOSIS — R059 Cough, unspecified: Secondary | ICD-10-CM

## 2020-09-06 DIAGNOSIS — R0781 Pleurodynia: Secondary | ICD-10-CM | POA: Diagnosis not present

## 2020-09-06 DIAGNOSIS — Z8719 Personal history of other diseases of the digestive system: Secondary | ICD-10-CM | POA: Diagnosis not present

## 2020-09-06 MED FILL — TRETINOIN 0.05 % CREA: 0.05 | 30 days supply | Qty: 20 | Fill #0

## 2020-09-28 MED FILL — SYNTHROID 50 MCG TABLET: 50 | 90 days supply | Qty: 90 | Fill #2

## 2020-10-29 ENCOUNTER — Other Ambulatory Visit (HOSPITAL_COMMUNITY): Payer: Self-pay | Admitting: *Deleted

## 2020-10-29 ENCOUNTER — Other Ambulatory Visit (HOSPITAL_COMMUNITY): Payer: Self-pay | Admitting: Nurse Practitioner

## 2020-10-29 MED ORDER — FLECAINIDE ACETATE 50 MG PO TABS: 50.0000 mg | ORAL_TABLET | Freq: Two times a day (BID) | ORAL | 1 refills | Status: DC

## 2020-10-29 MED FILL — FLECAINIDE ACETATE 50 MG TA: 50 | 90 days supply | Qty: 180 | Fill #0

## 2020-11-16 ENCOUNTER — Other Ambulatory Visit (HOSPITAL_COMMUNITY): Payer: Self-pay

## 2020-11-16 MED FILL — Apixaban Tab 5 MG: ORAL | 90 days supply | Qty: 180 | Fill #0 | Status: AC

## 2020-12-01 ENCOUNTER — Other Ambulatory Visit: Payer: Self-pay | Admitting: Obstetrics & Gynecology

## 2020-12-01 DIAGNOSIS — Z1231 Encounter for screening mammogram for malignant neoplasm of breast: Secondary | ICD-10-CM

## 2020-12-24 ENCOUNTER — Other Ambulatory Visit (HOSPITAL_COMMUNITY): Payer: Self-pay

## 2020-12-24 ENCOUNTER — Ambulatory Visit: Payer: PPO

## 2020-12-25 ENCOUNTER — Other Ambulatory Visit (HOSPITAL_COMMUNITY): Payer: Self-pay

## 2020-12-27 ENCOUNTER — Other Ambulatory Visit (HOSPITAL_COMMUNITY): Payer: Self-pay

## 2020-12-27 MED ORDER — SYNTHROID 50 MCG PO TABS
ORAL_TABLET | ORAL | 3 refills | Status: DC
  Filled 2020-12-27: qty 90, 90d supply, fill #0
  Filled 2021-03-12: qty 90, 90d supply, fill #1
  Filled 2021-06-29: qty 90, 90d supply, fill #2
  Filled 2021-09-23: qty 90, 90d supply, fill #3

## 2020-12-28 ENCOUNTER — Other Ambulatory Visit (HOSPITAL_COMMUNITY): Payer: Self-pay

## 2021-01-09 IMAGING — MG MM DIGITAL DIAGNOSTIC UNILAT*R* W/ TOMO W/ CAD
4 series · 4 of 12 positions shown · non-contrast
Comparison: Previous exam(s).

CLINICAL DATA: Patient was recalled from screening mammogram for a
possible asymmetry in the right breast.

EXAM:
DIGITAL DIAGNOSTIC UNILATERAL RIGHT MAMMOGRAM WITH TOMO AND CAD

[R ML synth-2D]
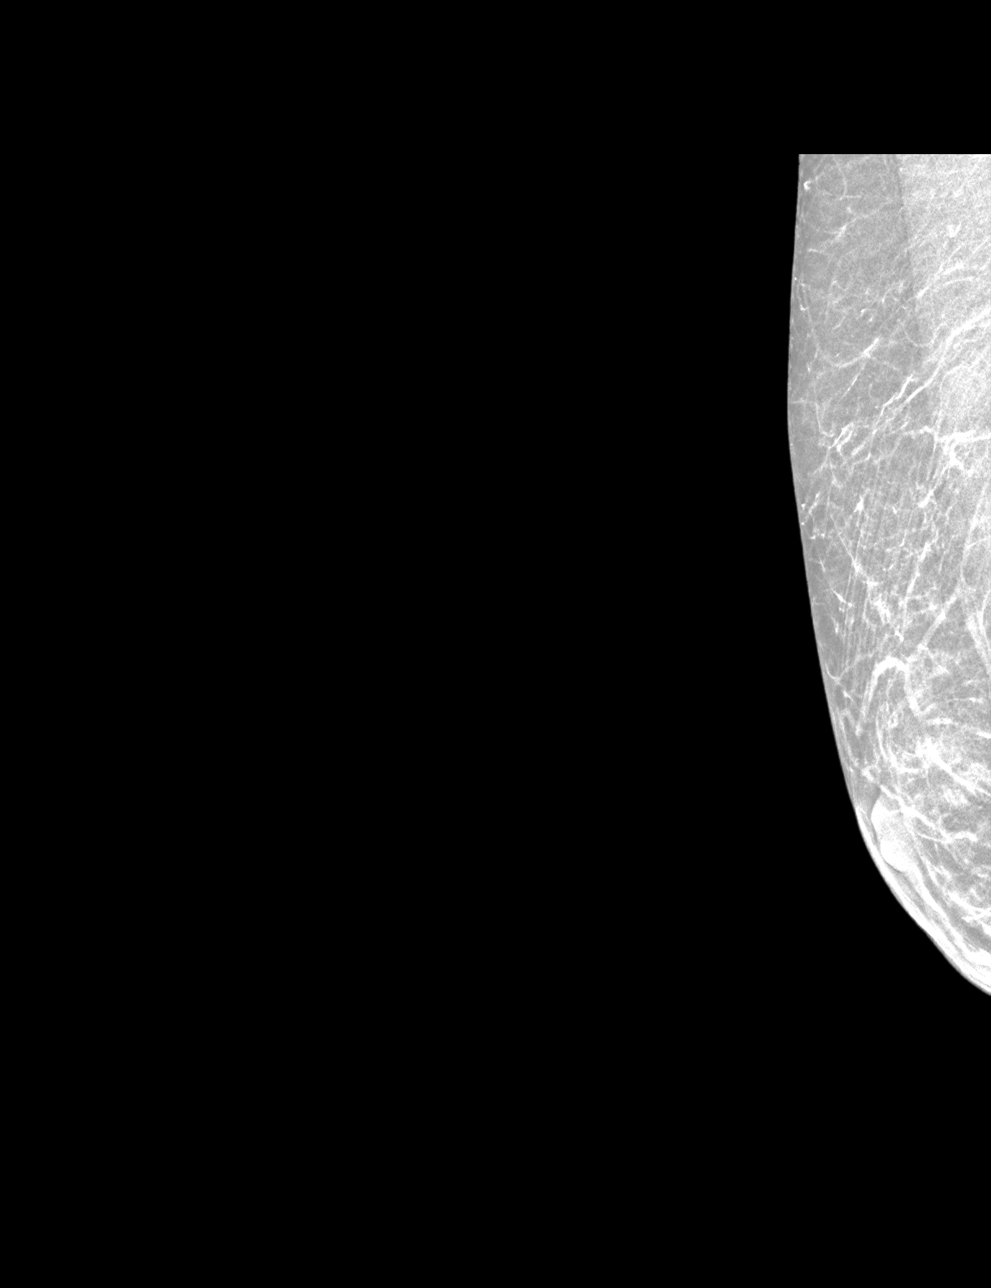

[R MLO synth-2D]
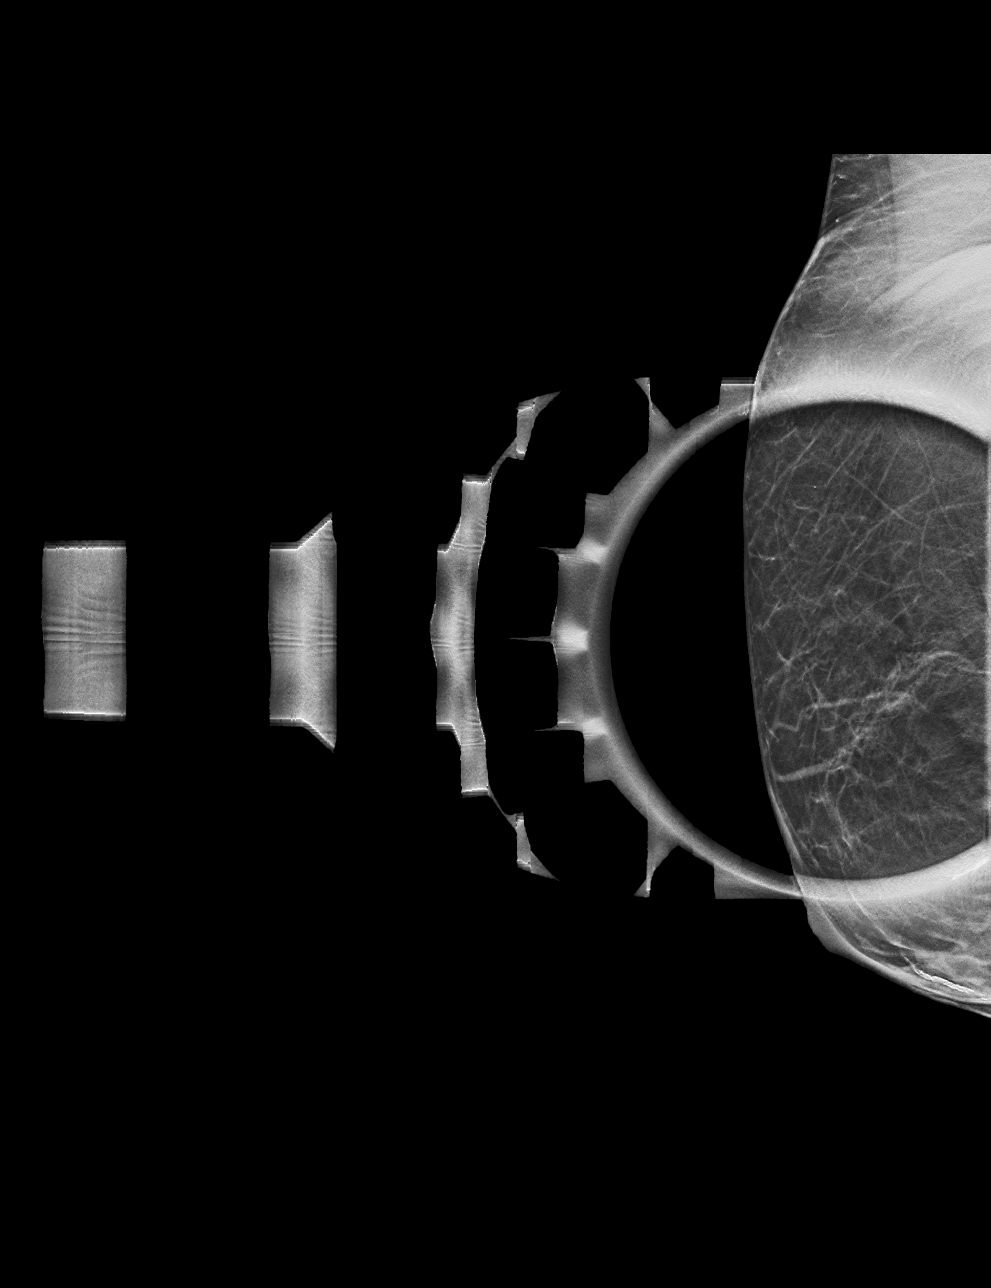

[R ML tomo · tomo slice 15/28.0]
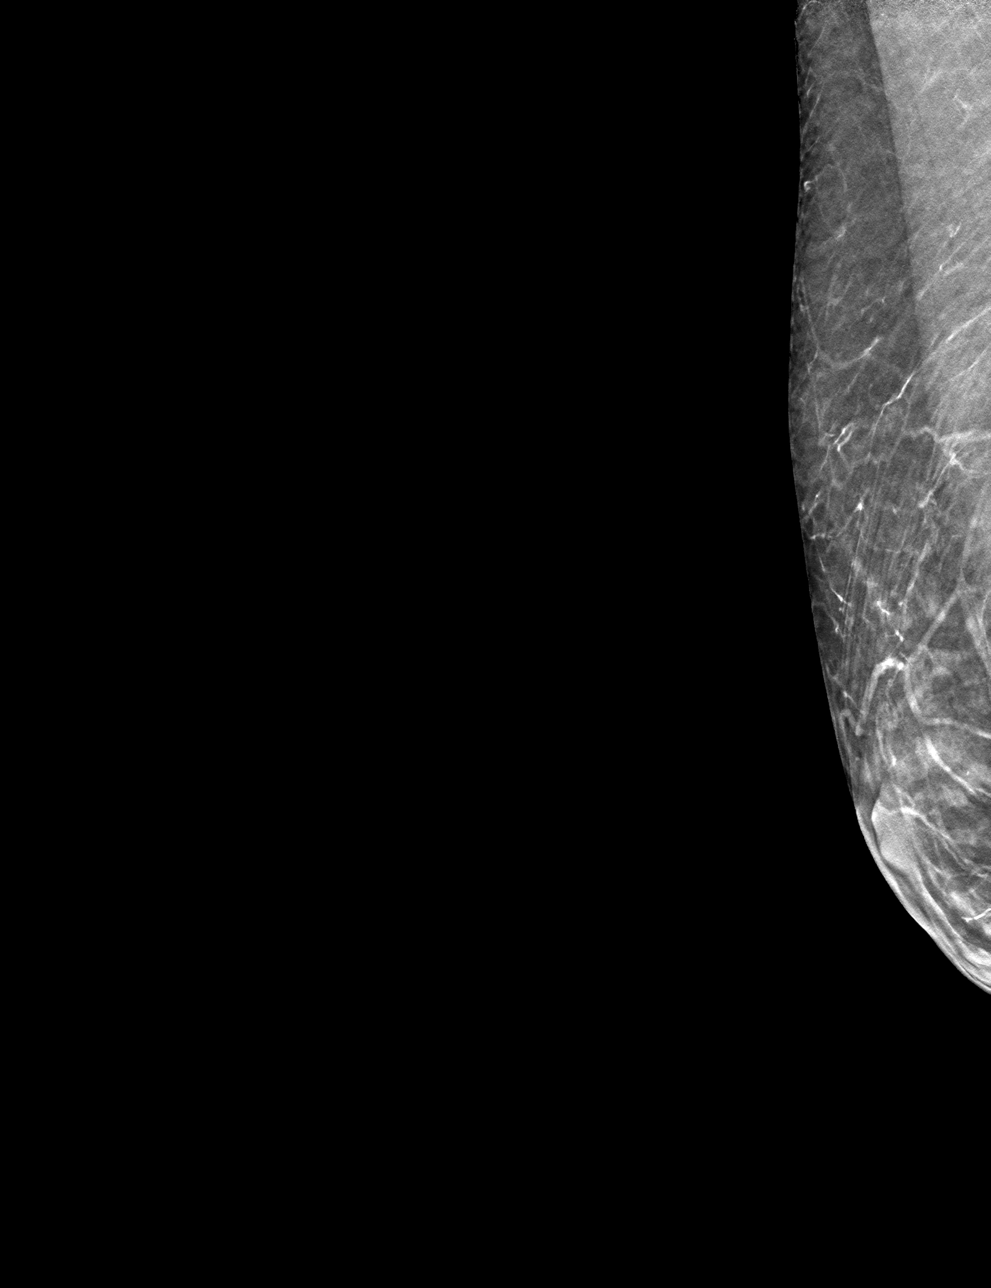

[R MLO tomo · tomo slice 13/24.0]
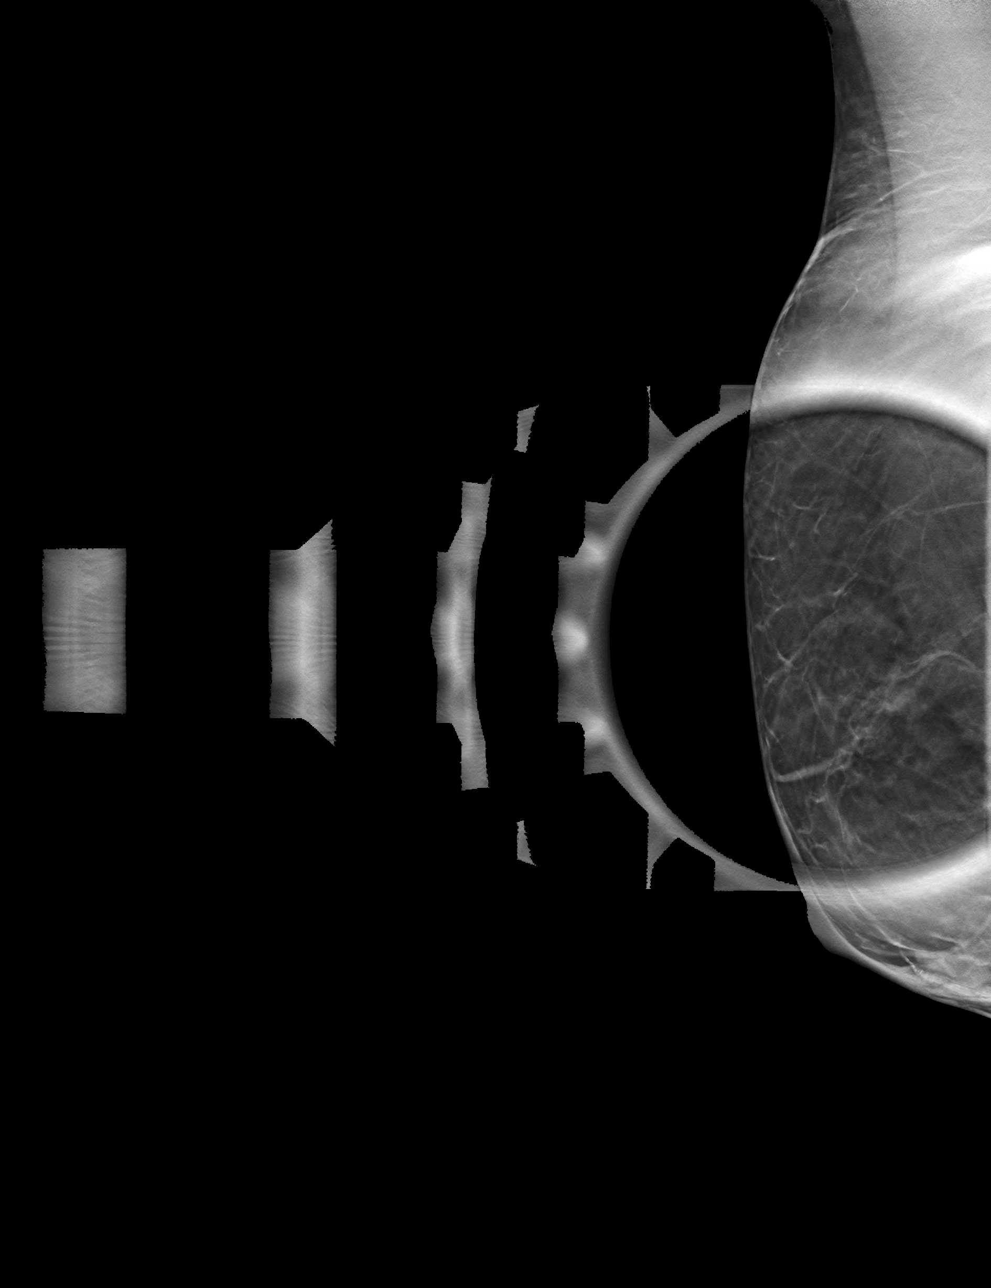

[4 of 12 positions shown; findings below may reference images not displayed]

ACR Breast Density Category b: There are scattered areas of
fibroglandular density.
FINDINGS: Additional imaging of the right breast was performed. No persistent
mass, distortion or malignant type microcalcifications identified.

Mammographic images were processed with CAD.
IMPRESSION: No evidence of malignancy in the right breast.

RECOMMENDATION:
Bilateral screening mammogram in 1 year is recommended.

I have discussed the findings and recommendations with the patient.
If applicable, a reminder letter will be sent to the patient
regarding the next appointment.

BI-RADS CATEGORY  1: Negative.

## 2021-01-10 ENCOUNTER — Other Ambulatory Visit (HOSPITAL_COMMUNITY): Payer: Self-pay

## 2021-01-10 MED ORDER — BETAMETHASONE DIPROPIONATE 0.05 % EX CREA
TOPICAL_CREAM | CUTANEOUS | 1 refills | Status: AC
  Filled 2021-01-10: qty 45, 15d supply, fill #0

## 2021-01-26 ENCOUNTER — Other Ambulatory Visit (HOSPITAL_COMMUNITY): Payer: Self-pay

## 2021-01-26 MED FILL — Flecainide Acetate Tab 50 MG: ORAL | 90 days supply | Qty: 180 | Fill #0 | Status: AC

## 2021-02-16 ENCOUNTER — Other Ambulatory Visit: Payer: Self-pay | Admitting: Nurse Practitioner

## 2021-02-16 ENCOUNTER — Other Ambulatory Visit (HOSPITAL_COMMUNITY): Payer: Self-pay

## 2021-02-16 MED ORDER — APIXABAN 5 MG PO TABS
5.0000 mg | ORAL_TABLET | Freq: Two times a day (BID) | ORAL | 3 refills | Status: DC
  Filled 2021-02-16: qty 180, 90d supply, fill #0
  Filled 2021-05-23: qty 180, 90d supply, fill #1
  Filled 2021-08-16: qty 180, 90d supply, fill #2
  Filled 2021-11-14: qty 180, 90d supply, fill #3

## 2021-02-16 MED FILL — Trazodone HCl Tab 50 MG: ORAL | 90 days supply | Qty: 90 | Fill #0 | Status: AC

## 2021-02-21 ENCOUNTER — Other Ambulatory Visit (HOSPITAL_COMMUNITY): Payer: Self-pay

## 2021-02-21 MED FILL — Sertraline HCl Tab 100 MG: ORAL | 90 days supply | Qty: 135 | Fill #0 | Status: AC

## 2021-03-07 ENCOUNTER — Other Ambulatory Visit (HOSPITAL_COMMUNITY): Payer: Self-pay

## 2021-03-12 ENCOUNTER — Other Ambulatory Visit (HOSPITAL_COMMUNITY): Payer: Self-pay

## 2021-03-17 ENCOUNTER — Ambulatory Visit (HOSPITAL_COMMUNITY)
Admission: RE | Admit: 2021-03-17 | Discharge: 2021-03-17 | Disposition: A | Discharge status: Home / Self Care | Payer: PPO | Source: Ambulatory Visit | Attending: Nurse Practitioner | Admitting: Nurse Practitioner

## 2021-03-17 ENCOUNTER — Encounter (HOSPITAL_COMMUNITY): Payer: Self-pay | Admitting: Nurse Practitioner

## 2021-03-17 ENCOUNTER — Ambulatory Visit
Admission: RE | Admit: 2021-03-17 | Discharge: 2021-03-17 | Disposition: A | Discharge status: Home / Self Care | Payer: PPO | Source: Ambulatory Visit | Attending: Obstetrics & Gynecology | Admitting: Obstetrics & Gynecology

## 2021-03-17 ENCOUNTER — Other Ambulatory Visit: Payer: Self-pay

## 2021-03-17 VITALS — BP 94/66 | HR 66 | Ht 62.0 in | Wt 108.0 lb

## 2021-03-17 DIAGNOSIS — I480 Paroxysmal atrial fibrillation: Secondary | ICD-10-CM | POA: Insufficient documentation

## 2021-03-17 DIAGNOSIS — Z7901 Long term (current) use of anticoagulants: Secondary | ICD-10-CM | POA: Diagnosis not present

## 2021-03-17 DIAGNOSIS — Z79899 Other long term (current) drug therapy: Secondary | ICD-10-CM | POA: Diagnosis not present

## 2021-03-17 DIAGNOSIS — Z8249 Family history of ischemic heart disease and other diseases of the circulatory system: Secondary | ICD-10-CM | POA: Diagnosis not present

## 2021-03-17 DIAGNOSIS — I4819 Other persistent atrial fibrillation: Secondary | ICD-10-CM | POA: Diagnosis not present

## 2021-03-17 DIAGNOSIS — D6869 Other thrombophilia: Secondary | ICD-10-CM | POA: Diagnosis not present

## 2021-03-17 DIAGNOSIS — Z1231 Encounter for screening mammogram for malignant neoplasm of breast: Secondary | ICD-10-CM | POA: Diagnosis not present

## 2021-03-17 DIAGNOSIS — I959 Hypotension, unspecified: Secondary | ICD-10-CM | POA: Insufficient documentation

## 2021-03-17 NOTE — Progress Notes (Signed)
Primary Care Physician: Josetta Huddle, MD Referring Physician:Dr. Drucie Ip Brinckerhoff Terri [name] is a 70 y.o. female with a h/o paroxymal afib that is in the afib clinic for f/u. She is on flecainide and is staying in SR. She does not notice any afib. She does have a low normal BP and is not on any rate control. She is volunteering in the gift show at the hospital now.   F/u in afib clinic, 03/17/21. She  has not noted any afib. No bleeding issues with anticoagulation. Remains on flecainide 50 mg bid and eliquis 5 mg bid for a CHA2DS2VASc score of 2.    Today, she denies symptoms of palpitations, chest pain, shortness of breath, orthopnea, PND, lower extremity edema, dizziness, presyncope, syncope, or neurologic sequela. The patient is tolerating medications without difficulties and is otherwise without complaint today.   Past Medical History:  Diagnosis Date   Arthritis    lt shoulder   Atrial flutter ([place])    Depression    Dyspareunia    Fibroid    Hypothyroidism    Osteoporosis May, 2017   Paroxysmal atrial fibrillation [facility])    Past Surgical History:  Procedure Laterality Date   ABDOMINAL HYSTERECTOMY     ABLATION  12-23-2013   PVI and CTI by Dr Rayann Heman   ATRIAL FIBRILLATION ABLATION N/A 12/23/2013   Procedure: ATRIAL FIBRILLATION ABLATION;  Surgeon: Coralyn Mark, MD;  Location: [place] CATH LAB;  Service: Cardiovascular;  Laterality: N/A;   AUGMENTATION MAMMAPLASTY Bilateral    BREAST ENHANCEMENT SURGERY  83,11   COSMETIC SURGERY  2003   face   FLEXIBLE SIGMOIDOSCOPY N/A 07/29/2013   Procedure: FLEXIBLE SIGMOIDOSCOPY;  Surgeon: [name], MD;  [facility] ENDOSCOPY;  Service: Endoscopy;  Laterality: N/A;   FRACTURE SURGERY Right    wrist   TEE WITHOUT CARDIOVERSION N/A 12/22/2013   Procedure: TRANSESOPHAGEAL ECHOCARDIOGRAM (TEE);  Surgeon: [name], MD;  Location: [place];  Service: Cardiovascular;  Laterality: N/A;   TONSILLECTOMY     TOTAL SHOULDER  ARTHROPLASTY Left 04/23/2015   Procedure: LEFT TOTAL SHOULDER ARTHROPLASTY;  Surgeon: Netta Cedars, MD;  Location: [place];  Service: Orthopedics;  Laterality: Left;    Current Outpatient Medications  Medication Sig Dispense Refill   acetaminophen (TYLENOL) 325 MG tablet Take by mouth as needed for mild pain or moderate pain.      AMBULATORY NON FORMULARY MEDICATION Vitamin A, D, K Take 1 capsule daily     apixaban (ELIQUIS) 5 MG TABS tablet Take 1 tablet (5 mg total) by mouth 2 (two) times daily. 180 tablet 3   betamethasone dipropionate 0.05 % cream Apply to affected area 2 times a day as needed 45 g 1   Calcium Citrate-Vitamin D (CALCIUM CITRATE + D PO) Take 1 capsule by mouth in the morning and at bedtime.     flecainide (TAMBOCOR) 50 MG tablet TAKE 1 TABLET BY MOUTH 2 TIMES DAILY 180 tablet 1   levothyroxine (SYNTHROID, LEVOTHROID) 50 MCG tablet Take 50 mcg by mouth daily.     sertraline (ZOLOFT) 100 MG tablet Take 0.5 tablets by mouth daily.  2   SYNTHROID 50 MCG tablet TAKE 1 TABLET BY MOUTH EVERY MORNING ON AN EMPTY STOMACH 90 tablet 3   traZODone (DESYREL) 50 MG tablet Take 25-50 mg by mouth at bedtime.      tretinoin (RETIN-A) 0.05 % cream APPLY A PEA SIZED AMOUNT TO FACE AT NIGHT. 20 g 2   No current  facility-administered medications for this encounter.    Allergies  Allergen Reactions   Botox [Onabotulinumtoxina] Other (See Comments)   Pantoprazole Other (See Comments)    Social History   Socioeconomic History   Marital status: Married    Spouse name: Not on file   Number of children: 2   Years of education: Not on file   Highest education level: Not on file  Occupational History   Occupation: retired  Tobacco Use   Smoking status: Never   Smokeless tobacco: Never  Vaping Use   Vaping Use: Never used  Substance and Sexual Activity   Alcohol use: Yes    Alcohol/week: 5.0 standard drinks    Types: 5 Glasses of wine per week    Comment: 1 per day   Drug use: No    Sexual activity: Not Currently    Partners: Male    Birth control/protection: None, Surgical    Comment: TVH--still has ovaries  Other Topics Concern   Not on file  Social History Narrative   Pt lives in [place] with spouse.  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   Social Determinants of Health   Financial Resource Strain: Not on file  Food Insecurity: Not on file  Transportation Needs: Not on file  Physical Activity: Not on file  Stress: Not on file  Social Connections: Not on file  Intimate Partner Violence: Not on file    Family History  Problem Relation Age of Onset   Hypertension Mother    Thyroid disease Mother    Lung cancer Mother    Breast cancer Paternal Grandmother 92   Esophageal cancer Father    Thyroid disease Sister    Kidney disease Sister    Depression Son    Stroke Maternal Grandmother    Heart disease Maternal Grandmother    Breast cancer Niece 48   Stomach cancer Neg Hx    Rectal cancer Neg Hx    Colon cancer Neg Hx     ROS- All systems are reviewed and negative except as per the HPI above  Physical Exam: Vitals:   03/17/21 1047  BP: 94/66  Pulse: 66  Weight: 49 kg  Height: '5\' 2"'$  (1.575 m)   Wt Readings from Last 3 Encounters:  03/17/21 49 kg  09/01/20 49.5 kg  03/03/20 48.5 kg    Labs: Lab Results  Component Value Date   NA 136 02/04/2020   K 4.2 02/04/2020   CL 99 02/04/2020   CO2 29 02/04/2020   GLUCOSE 90 02/04/2020   BUN 19 02/04/2020   CREATININE 0.68 02/04/2020   CALCIUM 9.5 02/04/2020   Lab Results  Component Value Date   INR 1.03 04/14/2015   Lab Results  Component Value Date   CHOL 250 (H) 09/22/2013   HDL 110.80 09/22/2013   TRIG 59.0 09/22/2013     GEN- The patient is well appearing, alert and oriented x 3 today.   Head- normocephalic, atraumatic Eyes-  Sclera clear, conjunctiva pink Ears- hearing intact Oropharynx- clear Neck- supple, no JVP Lymph- no cervical lymphadenopathy Lungs- Clear to  ausculation bilaterally, normal work of breathing Heart- Regular rate and rhythm, no murmurs, rubs or gallops, PMI not laterally displaced GI- soft, NT, ND, + BS Extremities- no clubbing, cyanosis, or edema MS- no significant deformity or atrophy Skin- no rash or lesion Psych- euthymic mood, full affect Neuro- strength and sensation are intact  EKG-SR with first degree AV block, pr int 220 ms, qrs int 86 ms, qtc  434 ms Epic records reviewed    Assessment and Plan: 1. Paroxysmal  afib She is not aware of any afib Continue flecainide  50 mg bid, not on rate control for low normal BP   2. CHA2DS2VASc score of 2 By newest guidelines, pt does not require daily anticoagulation We discussed this in detail in the past  She is tolerating well and wants to continue at this point  She is on the correct does of eliquis 5 mg bid   3. Hypotension Pt's norm Asymptomatic   F/u in 6 months  Butch Penny C. Florencio Hollibaugh, [facility] [address] [place], [place] [address] [phone]

## 2021-03-22 ENCOUNTER — Ambulatory Visit (HOSPITAL_BASED_OUTPATIENT_CLINIC_OR_DEPARTMENT_OTHER): Payer: PPO | Admitting: Obstetrics & Gynecology

## 2021-04-12 ENCOUNTER — Other Ambulatory Visit (HOSPITAL_COMMUNITY): Payer: Self-pay

## 2021-04-12 MED ORDER — CARESTART COVID-19 HOME TEST VI KIT
PACK | 0 refills | Status: DC
  Filled 2021-04-12: qty 4, 4d supply, fill #0

## 2021-04-13 ENCOUNTER — Encounter (HOSPITAL_BASED_OUTPATIENT_CLINIC_OR_DEPARTMENT_OTHER): Payer: Self-pay

## 2021-04-13 ENCOUNTER — Ambulatory Visit (HOSPITAL_BASED_OUTPATIENT_CLINIC_OR_DEPARTMENT_OTHER): Payer: PPO | Admitting: Obstetrics & Gynecology

## 2021-04-13 DIAGNOSIS — R0981 Nasal congestion: Secondary | ICD-10-CM | POA: Diagnosis not present

## 2021-04-13 DIAGNOSIS — J101 Influenza due to other identified influenza virus with other respiratory manifestations: Secondary | ICD-10-CM | POA: Diagnosis not present

## 2021-04-13 DIAGNOSIS — R059 Cough, unspecified: Secondary | ICD-10-CM | POA: Diagnosis not present

## 2021-04-13 DIAGNOSIS — Z03818 Encounter for observation for suspected exposure to other biological agents ruled out: Secondary | ICD-10-CM | POA: Diagnosis not present

## 2021-04-13 DIAGNOSIS — R509 Fever, unspecified: Secondary | ICD-10-CM | POA: Diagnosis not present

## 2021-04-14 ENCOUNTER — Other Ambulatory Visit (HOSPITAL_COMMUNITY): Payer: Self-pay

## 2021-04-14 MED ORDER — OSELTAMIVIR PHOSPHATE 75 MG PO CAPS
ORAL_CAPSULE | ORAL | 0 refills | Status: DC
  Filled 2021-04-14: qty 10, 5d supply, fill #0

## 2021-04-15 ENCOUNTER — Encounter (HOSPITAL_BASED_OUTPATIENT_CLINIC_OR_DEPARTMENT_OTHER): Payer: Self-pay

## 2021-04-15 ENCOUNTER — Telehealth (HOSPITAL_BASED_OUTPATIENT_CLINIC_OR_DEPARTMENT_OTHER): Payer: Self-pay | Admitting: Obstetrics & Gynecology

## 2021-04-15 NOTE — Telephone Encounter (Signed)
Called patient several times but her phone goes straight to voice mail .

## 2021-04-21 ENCOUNTER — Other Ambulatory Visit (HOSPITAL_COMMUNITY): Payer: Self-pay

## 2021-04-21 DIAGNOSIS — R058 Other specified cough: Secondary | ICD-10-CM | POA: Diagnosis not present

## 2021-04-21 MED ORDER — BENZONATATE 100 MG PO CAPS
ORAL_CAPSULE | ORAL | 0 refills | Status: DC
  Filled 2021-04-21: qty 30, 10d supply, fill #0

## 2021-04-21 MED ORDER — PREDNISONE 5 MG PO TABS
ORAL_TABLET | ORAL | 0 refills | Status: DC
  Filled 2021-04-21: qty 28, 7d supply, fill #0

## 2021-04-27 ENCOUNTER — Other Ambulatory Visit (HOSPITAL_COMMUNITY): Payer: Self-pay | Admitting: Nurse Practitioner

## 2021-04-27 ENCOUNTER — Other Ambulatory Visit (HOSPITAL_COMMUNITY): Payer: Self-pay

## 2021-04-27 MED ORDER — FLECAINIDE ACETATE 50 MG PO TABS
ORAL_TABLET | Freq: Two times a day (BID) | ORAL | 2 refills | Status: DC
  Filled 2021-04-27: qty 180, 90d supply, fill #0
  Filled 2021-07-28: qty 180, 90d supply, fill #1
  Filled 2021-10-23: qty 180, 90d supply, fill #2

## 2021-04-28 DIAGNOSIS — H04123 Dry eye syndrome of bilateral lacrimal glands: Secondary | ICD-10-CM | POA: Diagnosis not present

## 2021-04-28 DIAGNOSIS — Z961 Presence of intraocular lens: Secondary | ICD-10-CM | POA: Diagnosis not present

## 2021-05-02 ENCOUNTER — Other Ambulatory Visit: Payer: Self-pay

## 2021-05-02 ENCOUNTER — Ambulatory Visit (INDEPENDENT_AMBULATORY_CARE_PROVIDER_SITE_OTHER): Payer: PPO | Admitting: Obstetrics & Gynecology

## 2021-05-02 ENCOUNTER — Encounter (HOSPITAL_BASED_OUTPATIENT_CLINIC_OR_DEPARTMENT_OTHER): Payer: Self-pay | Admitting: Obstetrics & Gynecology

## 2021-05-02 VITALS — BP 113/71 | HR 65 | Ht 62.0 in | Wt 108.8 lb

## 2021-05-02 DIAGNOSIS — Z01419 Encounter for gynecological examination (general) (routine) without abnormal findings: Secondary | ICD-10-CM

## 2021-05-02 DIAGNOSIS — Z780 Asymptomatic menopausal state: Secondary | ICD-10-CM

## 2021-05-02 DIAGNOSIS — M8580 Other specified disorders of bone density and structure, unspecified site: Secondary | ICD-10-CM

## 2021-05-02 DIAGNOSIS — Z90710 Acquired absence of both cervix and uterus: Secondary | ICD-10-CM | POA: Diagnosis not present

## 2021-05-02 NOTE — Progress Notes (Signed)
70 y.o. G3P2002 Married White or Caucasian female here for breast and pelvic exam.  I am also following her for osteopenia.  She's been doing [employer].  Last bone density was 05/2020.  T score was -2.4.  Had been -2.6 in 2019.    She went to [place] and [place] this [place].  This was a summer cruise with [place].  She came home from the trip with influenza A.  Denies vaginal bleeding.  Patient's last menstrual period was 08/08/1983 (approximate).          Sexually active: No.  H/O STD:  no  Health Maintenance: PCP:  Dr. Inda Merlin.  Reports appt has been scheduled and rescheduled several times.  Has appt in November.     Vaccines are up to date:  yes Colonoscopy:  11/10/2015 MMG:  03/17/2021 Negative BMD:  03/16/2020 Osteopenia Last pap smear:  09/05/2017 Negative.   H/o abnormal pap smear:  remote hx   reports that she has never smoked. She has never used smokeless tobacco. She reports current alcohol use of about 5.0 standard drinks per week. She reports that she does not use drugs.  Past Medical History:  Diagnosis Date   Arthritis    lt shoulder   Atrial flutter ([place])    Depression    Dyspareunia    Fibroid    Hypothyroidism    Osteoporosis May, 2017   Paroxysmal atrial fibrillation [facility])     Past Surgical History:  Procedure Laterality Date   ABDOMINAL HYSTERECTOMY     ABLATION  12-23-2013   PVI and CTI by Dr Rayann Heman   ATRIAL FIBRILLATION ABLATION N/A 12/23/2013   Procedure: ATRIAL FIBRILLATION ABLATION;  Surgeon: Coralyn Mark, MD;  Location: [place] CATH LAB;  Service: Cardiovascular;  Laterality: N/A;   AUGMENTATION MAMMAPLASTY Bilateral    BREAST ENHANCEMENT SURGERY  83,11   COSMETIC SURGERY  2003   face   FLEXIBLE SIGMOIDOSCOPY N/A 07/29/2013   Procedure: FLEXIBLE SIGMOIDOSCOPY;  Surgeon: [name], MD;  [facility]OS[facility];  Service: Endoscopy;  Laterality: N/A;   FRACTURE SURGERY Right    wrist   TEE WITHOUT CARDIOVERSION N/A 12/22/2013   Procedure:  TRANSESOPHAGEAL ECHOCARDIOGRAM (TEE);  Surgeon: [name], MD;  Location: [place];  Service: Cardiovascular;  Laterality: N/A;   TONSILLECTOMY     TOTAL SHOULDER ARTHROPLASTY Left 04/23/2015   Procedure: LEFT TOTAL SHOULDER ARTHROPLASTY;  Surgeon: Netta Cedars, MD;  Location: [facility];  Service: Orthopedics;  Laterality: Left;    Current Outpatient Medications  Medication Sig Dispense Refill   acetaminophen (TYLENOL) 325 MG tablet Take by mouth as needed for mild pain or moderate pain.      AMBULATORY NON FORMULARY MEDICATION Vitamin A, D, K Take 1 capsule daily     apixaban (ELIQUIS) 5 MG TABS tablet Take 1 tablet (5 mg total) by mouth 2 (two) times daily. 180 tablet 3   betamethasone dipropionate 0.05 % cream Apply to affected area 2 times a day as needed 45 g 1   Calcium Citrate-Vitamin D (CALCIUM CITRATE + D PO) Take 1 capsule by mouth in the morning and at bedtime.     COVID-19 At Home Antigen Test [facility] COVID-19 HOME TEST) KIT Use as directed within package instruction. 4 each 0   sertraline (ZOLOFT) 100 MG tablet Take 0.5 tablets by mouth daily.  2   SYNTHROID 50 MCG tablet TAKE 1 TABLET BY MOUTH EVERY MORNING ON AN EMPTY STOMACH 90 tablet 3   traZODone (DESYREL) 50  MG tablet Take 25-50 mg by mouth at bedtime.      tretinoin (RETIN-A) 0.05 % cream APPLY A PEA SIZED AMOUNT TO FACE AT NIGHT. 20 g 2   flecainide (TAMBOCOR) 50 MG tablet TAKE 1 TABLET BY MOUTH 2 TIMES DAILY (Patient not taking: Reported on 05/02/2021) 180 tablet 2   No current facility-administered medications for this visit.    Family History  Problem Relation Age of Onset   Hypertension Mother    Thyroid disease Mother    Lung cancer Mother    Breast cancer Paternal Grandmother 30   Esophageal cancer Father    Thyroid disease Sister    Kidney disease Sister    Depression Son    Stroke Maternal Grandmother    Heart disease Maternal Grandmother    Breast cancer Niece 50   Stomach cancer Neg Hx     Rectal cancer Neg Hx    Colon cancer Neg Hx     Review of Systems  Gastrointestinal: Negative.   Genitourinary: Negative.   Psychiatric/Behavioral: Negative.    All other systems reviewed and are negative.  Exam:   BP 113/71 (BP Location: Right Arm, Patient Position: Sitting, Cuff Size: Small)   Pulse 65   Ht 5' 2" (1.575 m)   Wt 108 lb 12.8 oz (49.4 kg)   LMP 08/08/1983 (Approximate)   BMI 19.90 kg/m   Height: 5' 2" (157.5 cm)  General appearance: alert, cooperative and appears stated age Breasts: normal appearance, no masses or tenderness Abdomen: soft, non-tender; bowel sounds normal; no masses,  no organomegaly Lymph nodes: Cervical, supraclavicular, and axillary nodes normal.  No abnormal inguinal nodes palpated Neurologic: Grossly normal  Pelvic: External genitalia:  no lesions              Urethra:  normal appearing urethra with no masses, tenderness or lesions              Bartholins and Skenes: normal                 Vagina: normal appearing vagina with atrophic changes and no discharge, no lesions              Cervix: absent              Pap taken: No. Bimanual Exam:  Uterus:  uterus absent              Adnexa: no mass, fullness, tenderness               Rectovaginal: Confirms               Anus:  normal sphincter tone, no lesions  Chaperone, Octaviano Batty, CMA, was present for exam.  Assessment/Plan: 1. Encntr for gyn exam (general) (routine) w/o abn findings - pap not indicated - MMG 03/2021 - BMD order placed for next year - Colonoscopy 2016 - lab work planned with Dr. Inda Merlin in November - care gaps updated/reviewed  2. Postmenopausal - no HRT  3. H/O: hysterectomy  4. Osteopenia, unspecified location - DG BONE DENSITY (DXA); Future

## 2021-05-05 ENCOUNTER — Encounter (HOSPITAL_BASED_OUTPATIENT_CLINIC_OR_DEPARTMENT_OTHER): Payer: Self-pay

## 2021-05-09 ENCOUNTER — Ambulatory Visit (HOSPITAL_BASED_OUTPATIENT_CLINIC_OR_DEPARTMENT_OTHER): Payer: PPO | Admitting: Obstetrics & Gynecology

## 2021-05-09 DIAGNOSIS — D1801 Hemangioma of skin and subcutaneous tissue: Secondary | ICD-10-CM | POA: Diagnosis not present

## 2021-05-09 DIAGNOSIS — L738 Other specified follicular disorders: Secondary | ICD-10-CM | POA: Diagnosis not present

## 2021-05-09 DIAGNOSIS — L821 Other seborrheic keratosis: Secondary | ICD-10-CM | POA: Diagnosis not present

## 2021-05-09 DIAGNOSIS — D692 Other nonthrombocytopenic purpura: Secondary | ICD-10-CM | POA: Diagnosis not present

## 2021-05-09 DIAGNOSIS — Z85828 Personal history of other malignant neoplasm of skin: Secondary | ICD-10-CM | POA: Diagnosis not present

## 2021-05-23 ENCOUNTER — Other Ambulatory Visit (HOSPITAL_COMMUNITY): Payer: Self-pay

## 2021-06-29 ENCOUNTER — Other Ambulatory Visit (HOSPITAL_COMMUNITY): Payer: Self-pay

## 2021-07-06 DIAGNOSIS — R0789 Other chest pain: Secondary | ICD-10-CM | POA: Diagnosis not present

## 2021-07-06 DIAGNOSIS — K219 Gastro-esophageal reflux disease without esophagitis: Secondary | ICD-10-CM | POA: Diagnosis not present

## 2021-07-06 DIAGNOSIS — F3342 Major depressive disorder, recurrent, in full remission: Secondary | ICD-10-CM | POA: Diagnosis not present

## 2021-07-06 DIAGNOSIS — L659 Nonscarring hair loss, unspecified: Secondary | ICD-10-CM | POA: Diagnosis not present

## 2021-07-06 DIAGNOSIS — Z79899 Other long term (current) drug therapy: Secondary | ICD-10-CM | POA: Diagnosis not present

## 2021-07-06 DIAGNOSIS — Z0001 Encounter for general adult medical examination with abnormal findings: Secondary | ICD-10-CM | POA: Diagnosis not present

## 2021-07-06 DIAGNOSIS — K589 Irritable bowel syndrome without diarrhea: Secondary | ICD-10-CM | POA: Diagnosis not present

## 2021-07-06 DIAGNOSIS — M810 Age-related osteoporosis without current pathological fracture: Secondary | ICD-10-CM | POA: Diagnosis not present

## 2021-07-06 DIAGNOSIS — D6869 Other thrombophilia: Secondary | ICD-10-CM | POA: Diagnosis not present

## 2021-07-06 DIAGNOSIS — I4891 Unspecified atrial fibrillation: Secondary | ICD-10-CM | POA: Diagnosis not present

## 2021-07-06 DIAGNOSIS — E039 Hypothyroidism, unspecified: Secondary | ICD-10-CM | POA: Diagnosis not present

## 2021-07-06 DIAGNOSIS — E559 Vitamin D deficiency, unspecified: Secondary | ICD-10-CM | POA: Diagnosis not present

## 2021-07-25 ENCOUNTER — Other Ambulatory Visit (HOSPITAL_COMMUNITY): Payer: Self-pay

## 2021-07-25 MED FILL — Tretinoin Cream 0.05%: CUTANEOUS | 20 days supply | Qty: 20 | Fill #0 | Status: CN

## 2021-07-28 ENCOUNTER — Other Ambulatory Visit (HOSPITAL_COMMUNITY): Payer: Self-pay

## 2021-08-12 ENCOUNTER — Other Ambulatory Visit (HOSPITAL_COMMUNITY): Payer: Self-pay

## 2021-08-12 MED FILL — Tretinoin Cream 0.05%: CUTANEOUS | 30 days supply | Qty: 20 | Fill #0 | Status: AC

## 2021-08-17 ENCOUNTER — Other Ambulatory Visit (HOSPITAL_COMMUNITY): Payer: Self-pay

## 2021-09-20 ENCOUNTER — Other Ambulatory Visit (HOSPITAL_COMMUNITY): Payer: Self-pay

## 2021-09-21 ENCOUNTER — Ambulatory Visit (HOSPITAL_COMMUNITY)
Admission: RE | Admit: 2021-09-21 | Discharge: 2021-09-21 | Disposition: A | Discharge status: Home / Self Care | Payer: PPO | Source: Ambulatory Visit | Attending: Nurse Practitioner | Admitting: Nurse Practitioner

## 2021-09-21 ENCOUNTER — Other Ambulatory Visit: Payer: Self-pay

## 2021-09-21 VITALS — BP 102/82 | HR 66 | Ht 62.0 in | Wt 108.4 lb

## 2021-09-21 DIAGNOSIS — D6869 Other thrombophilia: Secondary | ICD-10-CM | POA: Diagnosis not present

## 2021-09-21 DIAGNOSIS — I480 Paroxysmal atrial fibrillation: Secondary | ICD-10-CM | POA: Diagnosis not present

## 2021-09-21 DIAGNOSIS — I959 Hypotension, unspecified: Secondary | ICD-10-CM | POA: Insufficient documentation

## 2021-09-21 DIAGNOSIS — Z7901 Long term (current) use of anticoagulants: Secondary | ICD-10-CM | POA: Diagnosis not present

## 2021-09-21 NOTE — Progress Notes (Signed)
Primary Care Physician: Josetta Huddle, MD Referring Physician:Dr. Wilfred Curtis Broussard is a 71 y.o. female with a h/o paroxymal afib that is in the afib clinic for f/u. She is on flecainide and is staying in SR. She does not notice any afib. She does have a low normal BP and is not on any rate control. She is volunteering in the gift show at the hospital now.   F/u in afib clinic, 03/17/21. She  has not noted any afib. No bleeding issues with anticoagulation. Remains on flecainide 50 mg bid and eliquis 5 mg bid for a CHA2DS2VASc score of 2.   F/u in afib clinic, 09/21/21.she is doing well staying in SR with flecainide 50 mg bid. No issues with anticoagulation.    Today, she denies symptoms of palpitations, chest pain, shortness of breath, orthopnea, PND, lower extremity edema, dizziness, presyncope, syncope, or neurologic sequela. The patient is tolerating medications without difficulties and is otherwise without complaint today.   Past Medical History:  Diagnosis Date   Arthritis    lt shoulder   Atrial flutter ([place])    Depression    Dyspareunia    Fibroid    Hypothyroidism    Osteoporosis May, 2017   Paroxysmal atrial fibrillation [facility])    Past Surgical History:  Procedure Laterality Date   ABDOMINAL HYSTERECTOMY     ABLATION  12-23-2013   PVI and CTI by Dr Rayann Heman   ATRIAL FIBRILLATION ABLATION N/A 12/23/2013   Procedure: ATRIAL FIBRILLATION ABLATION;  Surgeon: Coralyn Mark, MD;  Location: [place] CATH LAB;  Service: Cardiovascular;  Laterality: N/A;   AUGMENTATION MAMMAPLASTY Bilateral    BREAST ENHANCEMENT SURGERY  83,11   COSMETIC SURGERY  2003   face   FLEXIBLE SIGMOIDOSCOPY N/A 07/29/2013   Procedure: FLEXIBLE SIGMOIDOSCOPY;  Surgeon: [name], MD;  [facility]OS[facility];  Service: Endoscopy;  Laterality: N/A;   FRACTURE SURGERY Right    wrist   TEE WITHOUT CARDIOVERSION N/A 12/22/2013   Procedure: TRANSESOPHAGEAL ECHOCARDIOGRAM (TEE);  Surgeon: [name], MD;  Location: [place];  Service: Cardiovascular;  Laterality: N/A;   TONSILLECTOMY     TOTAL SHOULDER ARTHROPLASTY Left 04/23/2015   Procedure: LEFT TOTAL SHOULDER ARTHROPLASTY;  Surgeon: Netta Cedars, MD;  Location: [place];  Service: Orthopedics;  Laterality: Left;    Current Outpatient Medications  Medication Sig Dispense Refill   Acetaminophen (ACETAMINOPHEN EXTRA STRENGTH) 500 MG capsule 2 capsules as needed     acetaminophen (TYLENOL) 325 MG tablet Take by mouth as needed for mild pain or moderate pain.      AMBULATORY NON FORMULARY MEDICATION Take 1 capsule by mouth every morning. Vitamin A, D, K Take 1 capsule daily     apixaban (ELIQUIS) 5 MG TABS tablet Take 1 tablet (5 mg total) by mouth 2 (two) times daily. 180 tablet 3   betamethasone dipropionate 0.05 % cream Apply to affected area 2 times a day as needed 45 g 1   Calcium Citrate-Vitamin D (CALCIUM CITRATE + D PO) Take 1 capsule by mouth in the morning and at bedtime.     flecainide (TAMBOCOR) 50 MG tablet TAKE 1 TABLET BY MOUTH 2 TIMES DAILY 180 tablet 2   sertraline (ZOLOFT) 100 MG tablet Take 0.5 tablets by mouth daily.  2   SYNTHROID 50 MCG tablet TAKE 1 TABLET BY MOUTH EVERY MORNING ON AN EMPTY STOMACH 90 tablet 3   traZODone (DESYREL) 50 MG tablet Take 25-50 mg by mouth  at bedtime.      No current facility-administered medications for this encounter.    Allergies  Allergen Reactions   Botox [Onabotulinumtoxina] Other (See Comments)    local eyelid drooping and headache   Pantoprazole Other (See Comments)    Made her feel bad.    Social History   Socioeconomic History   Marital status: Married    Spouse name: Not on file   Number of children: 2   Years of education: Not on file   Highest education level: Not on file  Occupational History   Occupation: retired  Tobacco Use   Smoking status: Never   Smokeless tobacco: Never  Vaping Use   Vaping Use: Never used  Substance and Sexual Activity    Alcohol use: Yes    Alcohol/week: 5.0 standard drinks    Types: 5 Glasses of wine per week    Comment: 1 per day   Drug use: No   Sexual activity: Not Currently    Partners: Male    Birth control/protection: None, Surgical    Comment: TVH--still has ovaries  Other Topics Concern   Not on file  Social History Narrative   Pt lives in [place] with spouse.  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   Social Determinants of Health   Financial Resource Strain: Not on file  Food Insecurity: Not on file  Transportation Needs: Not on file  Physical Activity: Not on file  Stress: Not on file  Social Connections: Not on file  Intimate Partner Violence: Not on file    Family History  Problem Relation Age of Onset   Hypertension Mother    Thyroid disease Mother    Lung cancer Mother    Breast cancer Paternal Grandmother 1   Esophageal cancer Father    Thyroid disease Sister    Kidney disease Sister    Depression Son    Stroke Maternal Grandmother    Heart disease Maternal Grandmother    Breast cancer Niece 41   Stomach cancer Neg Hx    Rectal cancer Neg Hx    Colon cancer Neg Hx     ROS- All systems are reviewed and negative except as per the HPI above  Physical Exam: Vitals:   09/21/21 1054  BP: 102/82  Pulse: 66  Weight: 49.2 kg  Height: 5\' 2"  (1.575 m)   Wt Readings from Last 3 Encounters:  09/21/21 49.2 kg  05/02/21 49.4 kg  03/17/21 49 kg    Labs: Lab Results  Component Value Date   NA 136 02/04/2020   K 4.2 02/04/2020   CL 99 02/04/2020   CO2 29 02/04/2020   GLUCOSE 90 02/04/2020   BUN 19 02/04/2020   CREATININE 0.68 02/04/2020   CALCIUM 9.5 02/04/2020   Lab Results  Component Value Date   INR 1.03 04/14/2015   Lab Results  Component Value Date   CHOL 250 (H) 09/22/2013   HDL 110.80 09/22/2013   TRIG 59.0 09/22/2013     GEN- The patient is well appearing, alert and oriented x 3 today.   Head- normocephalic, atraumatic Eyes-  Sclera  clear, conjunctiva pink Ears- hearing intact Oropharynx- clear Neck- supple, no JVP Lymph- no cervical lymphadenopathy Lungs- Clear to ausculation bilaterally, normal work of breathing Heart- Regular rate and rhythm, no murmurs, rubs or gallops, PMI not laterally displaced GI- soft, NT, ND, + BS Extremities- no clubbing, cyanosis, or edema MS- no significant deformity or atrophy Skin- no rash or lesion Psych- euthymic mood,  full affect Neuro- strength and sensation are intact  EKG-SR with first degree AV block, pr int 212 ms, qrs int 84 ms, qtc 421 ms Epic records reviewed    Assessment and Plan: 1. Paroxysmal  afib She is not aware of any afib Continue flecainide  50 mg bid, not on rate control for low normal BP   2. CHA2DS2VASc score of 2 Continue eliquis 5 mg bid   3. Hypotension Pt's norm Asymptomatic   F/u in 6 months  Butch Penny C. Shalamar Crays, [facility] [address] [place], [place] [address] [phone]

## 2021-09-22 ENCOUNTER — Other Ambulatory Visit (HOSPITAL_COMMUNITY): Payer: Self-pay

## 2021-09-22 MED ORDER — TRAZODONE HCL 50 MG PO TABS
ORAL_TABLET | ORAL | 3 refills | Status: DC
  Filled 2022-03-15: qty 90, 90d supply, fill #0
  Filled 2022-09-06: qty 90, 90d supply, fill #1

## 2021-09-22 MED ORDER — TRAZODONE HCL 50 MG PO TABS
ORAL_TABLET | ORAL | 3 refills | Status: DC
  Filled 2021-09-22: qty 90, 90d supply, fill #0

## 2021-09-23 ENCOUNTER — Other Ambulatory Visit (HOSPITAL_COMMUNITY): Payer: Self-pay

## 2021-09-24 ENCOUNTER — Other Ambulatory Visit (HOSPITAL_COMMUNITY): Payer: Self-pay

## 2021-10-24 ENCOUNTER — Other Ambulatory Visit (HOSPITAL_COMMUNITY): Payer: Self-pay

## 2021-11-15 ENCOUNTER — Other Ambulatory Visit (HOSPITAL_COMMUNITY): Payer: Self-pay

## 2021-12-12 ENCOUNTER — Other Ambulatory Visit (HOSPITAL_COMMUNITY): Payer: Self-pay

## 2021-12-12 MED ORDER — LEVOTHYROXINE SODIUM 50 MCG PO TABS
ORAL_TABLET | ORAL | 3 refills | Status: DC
  Filled 2021-12-12: qty 90, 90d supply, fill #0
  Filled 2022-03-15: qty 90, 90d supply, fill #1
  Filled 2022-06-15: qty 90, 90d supply, fill #2
  Filled 2022-09-13: qty 90, 90d supply, fill #3

## 2021-12-21 ENCOUNTER — Other Ambulatory Visit (HOSPITAL_COMMUNITY): Payer: Self-pay

## 2021-12-21 MED ORDER — SERTRALINE HCL 100 MG PO TABS
ORAL_TABLET | ORAL | 4 refills | Status: DC
  Filled 2021-12-21: qty 45, 90d supply, fill #0
  Filled 2022-03-15: qty 45, 90d supply, fill #1
  Filled 2022-06-15: qty 45, 90d supply, fill #2
  Filled 2022-09-13: qty 50, 100d supply, fill #3

## 2021-12-31 IMAGING — MG DIGITAL SCREENING BREAST BILAT IMPLANT W/ TOMO W/ CAD
9 of 12 series · 9 of 28 positions shown · non-contrast
Comparison: Previous exam(s).

CLINICAL DATA: Screening.

EXAM:
DIGITAL SCREENING BILATERAL MAMMOGRAM WITH IMPLANTS, CAD AND
TOMOSYNTHESIS
TECHNIQUE: Bilateral screening digital craniocaudal and mediolateral oblique
mammograms were obtained. Bilateral screening digital breast
tomosynthesis was performed. The images were evaluated with
computer-aided detection. Standard and/or implant displaced views
were performed.

[L MLO]
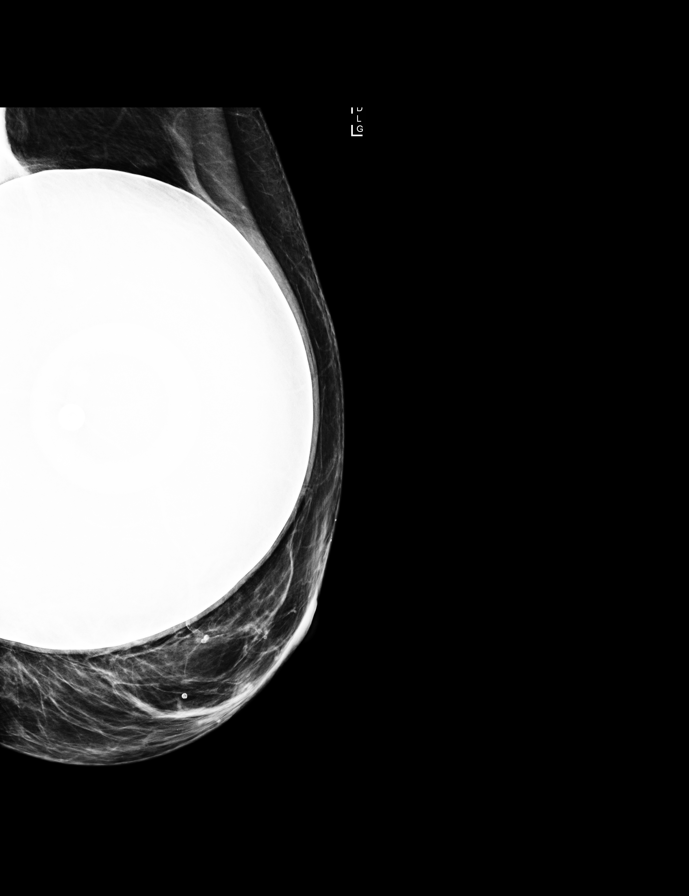

[R MLO]
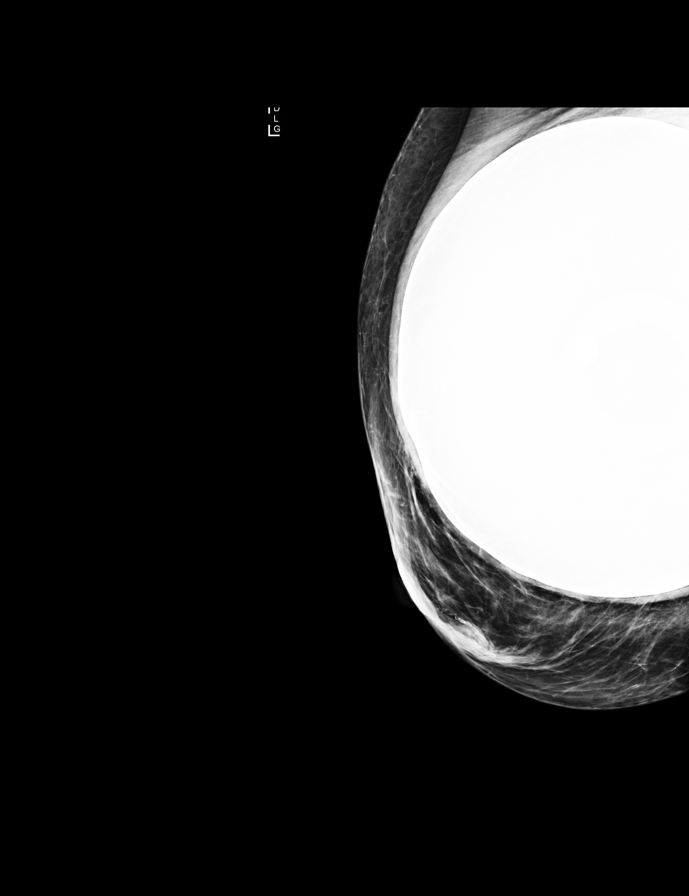

[L CC]
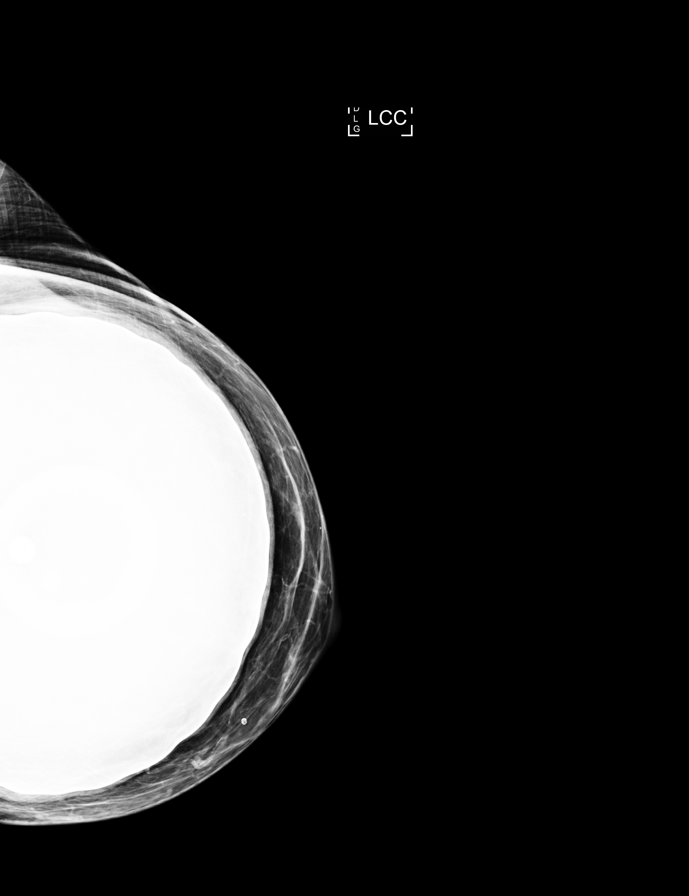

[R CC]
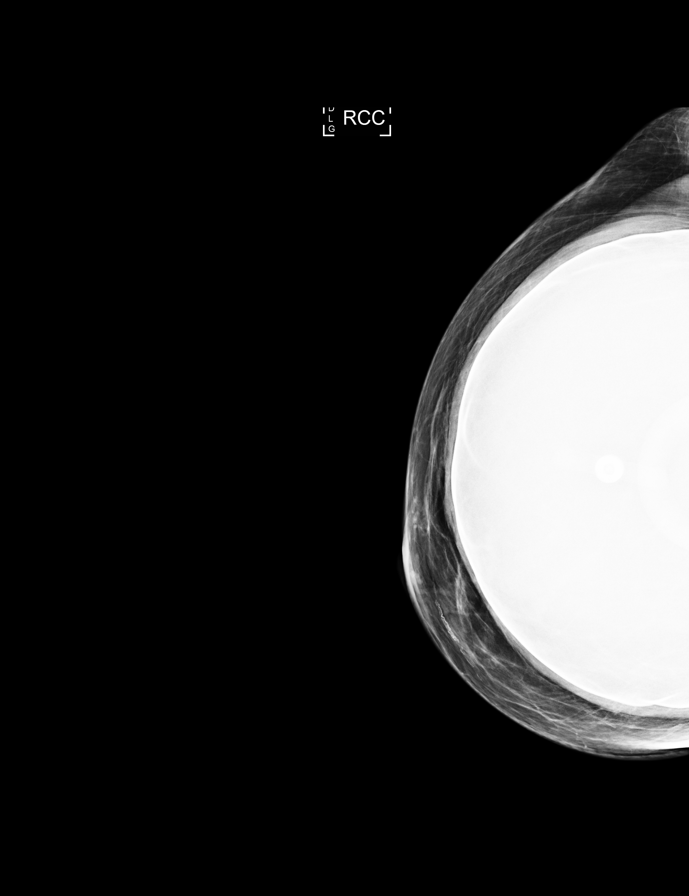

[R MLO synth-2D]
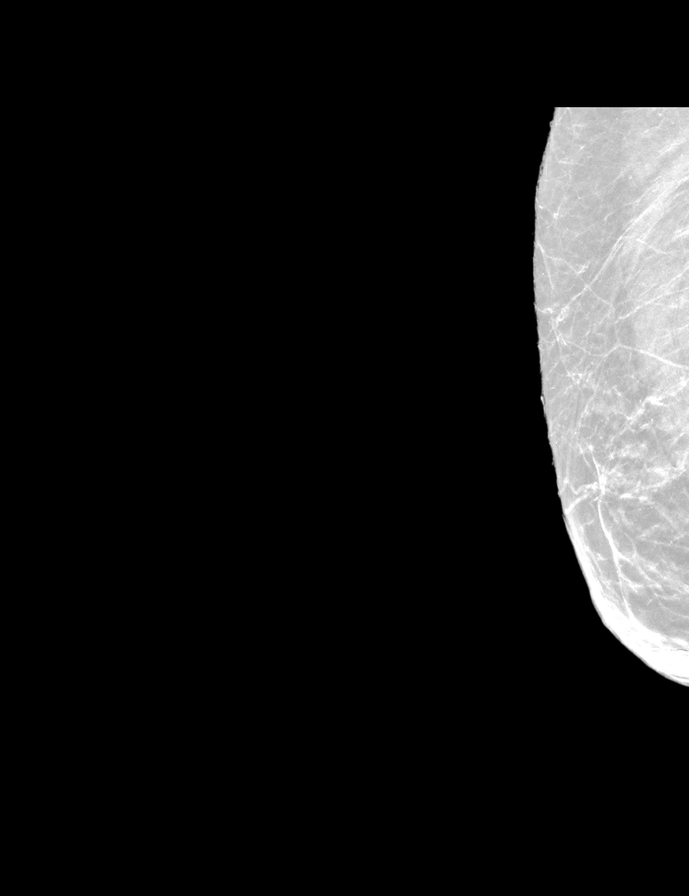

[L MLO synth-2D]
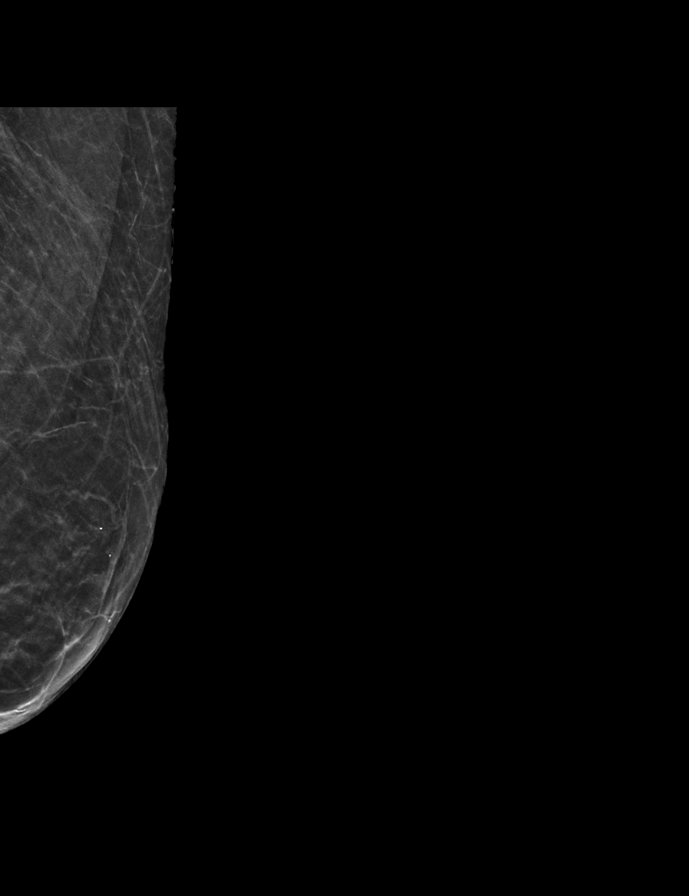

[R CC synth-2D]
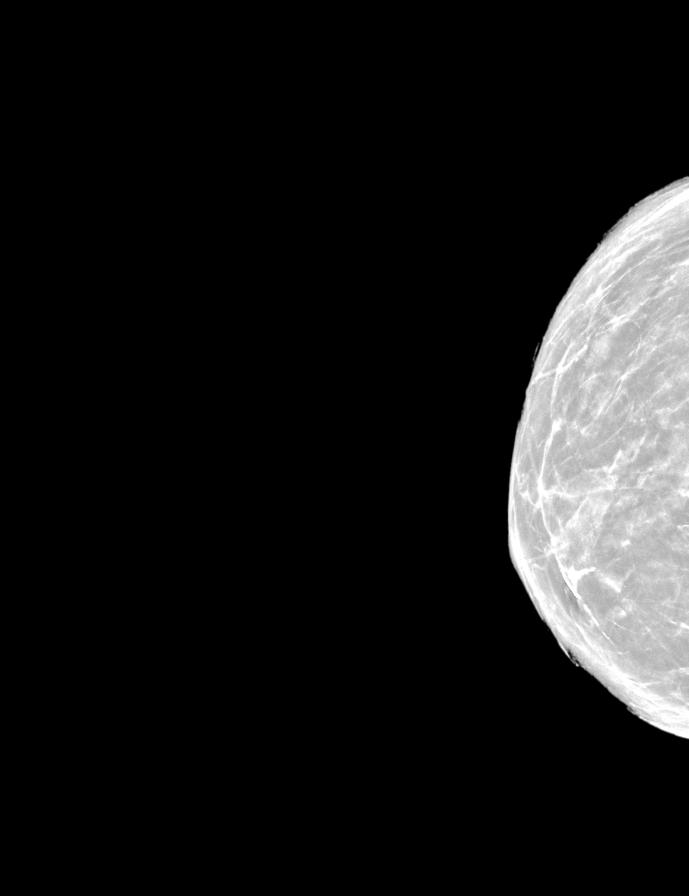

[L CC synth-2D]
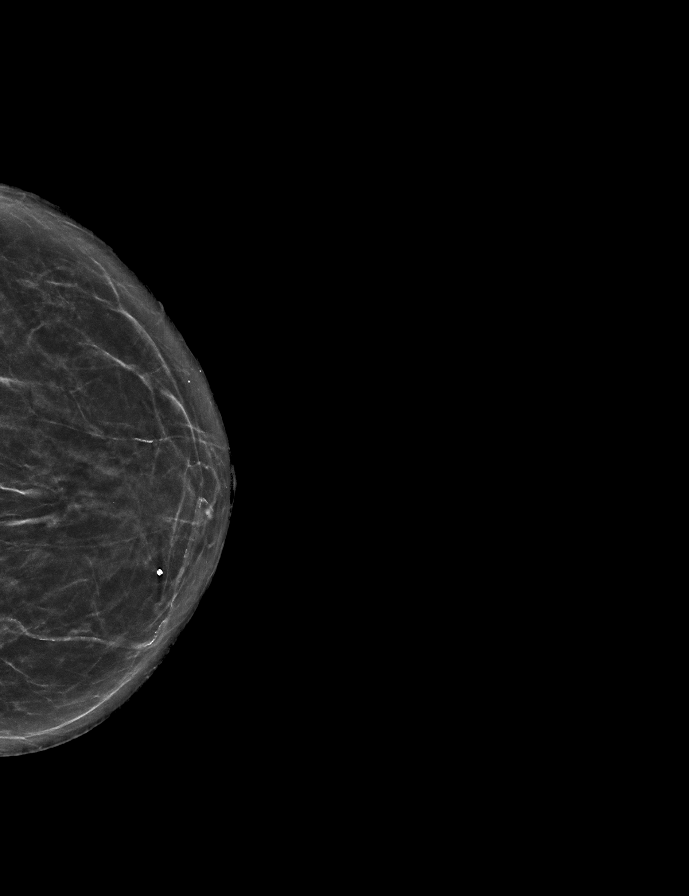

[L MLOID BREAST TOMOSYNTHESIS IMAGE tomo · tomo slice 19/38.0]
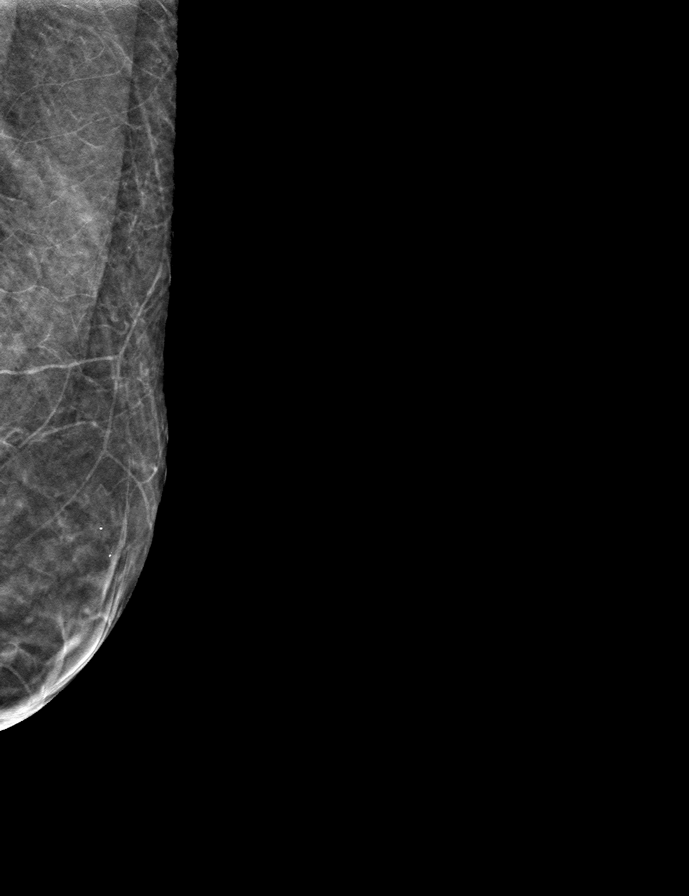

[9 of 28 positions shown; findings below may reference images not displayed]

ACR Breast Density Category b: There are scattered areas of
fibroglandular density.
FINDINGS: The patient has retropectoral implants. There are no findings
suspicious for malignancy.
IMPRESSION: No mammographic evidence of malignancy. A result letter of this
screening mammogram will be mailed directly to the patient.

RECOMMENDATION:
Screening mammogram in one year. (Code:SE-S-JMG)

BI-RADS CATEGORY  1:  Negative.

## 2022-01-25 ENCOUNTER — Other Ambulatory Visit (HOSPITAL_COMMUNITY): Payer: Self-pay | Admitting: Nurse Practitioner

## 2022-01-26 ENCOUNTER — Other Ambulatory Visit (HOSPITAL_COMMUNITY): Payer: Self-pay

## 2022-01-26 MED ORDER — FLECAINIDE ACETATE 50 MG PO TABS
ORAL_TABLET | Freq: Two times a day (BID) | ORAL | 2 refills | Status: DC
  Filled 2022-01-26: qty 180, 90d supply, fill #0
  Filled 2022-05-02: qty 180, 90d supply, fill #1
  Filled 2022-08-01: qty 180, 90d supply, fill #2

## 2022-02-01 ENCOUNTER — Other Ambulatory Visit: Payer: Self-pay | Admitting: Physical Medicine and Rehabilitation

## 2022-02-01 ENCOUNTER — Other Ambulatory Visit: Payer: Self-pay | Admitting: Obstetrics & Gynecology

## 2022-02-01 DIAGNOSIS — Z1231 Encounter for screening mammogram for malignant neoplasm of breast: Secondary | ICD-10-CM

## 2022-02-01 DIAGNOSIS — M8580 Other specified disorders of bone density and structure, unspecified site: Secondary | ICD-10-CM

## 2022-02-22 ENCOUNTER — Other Ambulatory Visit: Payer: Self-pay | Admitting: Nurse Practitioner

## 2022-02-22 ENCOUNTER — Other Ambulatory Visit (HOSPITAL_COMMUNITY): Payer: Self-pay

## 2022-02-22 MED ORDER — APIXABAN 5 MG PO TABS
5.0000 mg | ORAL_TABLET | Freq: Two times a day (BID) | ORAL | 3 refills | Status: DC
  Filled 2022-02-22: qty 180, 90d supply, fill #0
  Filled 2022-05-19: qty 180, 90d supply, fill #1
  Filled 2022-08-21: qty 200, 100d supply, fill #2
  Filled 2022-11-25: qty 160, 80d supply, fill #3

## 2022-03-15 ENCOUNTER — Other Ambulatory Visit (HOSPITAL_COMMUNITY): Payer: Self-pay

## 2022-03-15 ENCOUNTER — Ambulatory Visit (HOSPITAL_COMMUNITY)
Admission: RE | Admit: 2022-03-15 | Discharge: 2022-03-15 | Disposition: A | Discharge status: Home / Self Care | Payer: PPO | Source: Ambulatory Visit | Attending: Nurse Practitioner | Admitting: Nurse Practitioner

## 2022-03-15 ENCOUNTER — Encounter (HOSPITAL_COMMUNITY): Payer: Self-pay | Admitting: Nurse Practitioner

## 2022-03-15 VITALS — BP 86/60 | HR 74 | Ht 62.0 in | Wt 107.2 lb

## 2022-03-15 DIAGNOSIS — I959 Hypotension, unspecified: Secondary | ICD-10-CM | POA: Diagnosis not present

## 2022-03-15 DIAGNOSIS — I480 Paroxysmal atrial fibrillation: Secondary | ICD-10-CM | POA: Diagnosis not present

## 2022-03-15 DIAGNOSIS — D6869 Other thrombophilia: Secondary | ICD-10-CM | POA: Diagnosis not present

## 2022-03-15 DIAGNOSIS — Z79899 Other long term (current) drug therapy: Secondary | ICD-10-CM | POA: Insufficient documentation

## 2022-03-15 NOTE — Progress Notes (Signed)
Primary Care Physician: Terri Huddle, MD Referring Physician:Dr. Wilfred Curtis [name] is a 71 y.o. female with a h/o paroxymal afib that is in the afib clinic for f/u. She is on flecainide and is staying in SR. She does not notice any afib. She does have a low normal BP and is not on any rate control. She is volunteering in the gift show at the hospital now.   F/u in afib clinic, 03/17/21. She  has not noted any afib. No bleeding issues with anticoagulation. Remains on flecainide 50 mg bid and eliquis 5 mg bid for a CHA2DS2VASc score of 2.   F/u in afib clinic, 09/21/21.she is doing well staying in SR with flecainide 50 mg bid. No issues with anticoagulation.   F/u in afib clinic for flecainide surveillance, 03/15/22. . Doing great, no afib to report. No issues with anticoagulation. Runs a low BP, not symptomatic with this.    Today, she denies symptoms of palpitations, chest pain, shortness of breath, orthopnea, PND, lower extremity edema, dizziness, presyncope, syncope, or neurologic sequela. The patient is tolerating medications without difficulties and is otherwise without complaint today.   Past Medical History:  Diagnosis Date   Arthritis    lt shoulder   Atrial flutter ([place])    Depression    Dyspareunia    Fibroid    Hypothyroidism    Osteoporosis May, 2017   Paroxysmal atrial fibrillation [facility])    Past Surgical History:  Procedure Laterality Date   ABDOMINAL HYSTERECTOMY     ABLATION  12-23-2013   PVI and CTI by Dr Terri [name]   ATRIAL FIBRILLATION ABLATION N/A 12/23/2013   Procedure: ATRIAL FIBRILLATION ABLATION;  Surgeon: Terri Mark, MD;  Location: [name] C[facility];  Service: Cardiovascular;  Laterality: N/A;   AUGMENTATION MAMMAPLASTY Bilateral    BREAST ENHANCEMENT SURGERY  83,11   COSMETIC SURGERY  2003   face   FLEXIBLE SIGMOIDOSCOPY N/A 07/29/2013   Procedure: FLEXIBLE SIGMOIDOSCOPY;  Surgeon: Terri Fair, MD;  [facility];  Service: Endoscopy;   Laterality: N/A;   FRACTURE SURGERY Right    wrist   TEE WITHOUT CARDIOVERSION N/A 12/22/2013   Procedure: TRANSESOPHAGEAL ECHOCARDIOGRAM (TEE);  Surgeon: Terri Hector, MD;  Location: [place];  Service: Cardiovascular;  Laterality: N/A;   TONSILLECTOMY     TOTAL SHOULDER ARTHROPLASTY Left 04/23/2015   Procedure: LEFT TOTAL SHOULDER ARTHROPLASTY;  Surgeon: Terri Cedars, MD;  Location: [place];  Service: Orthopedics;  Laterality: Left;    Current Outpatient Medications  Medication Sig Dispense Refill   Acetaminophen (ACETAMINOPHEN EXTRA STRENGTH) 500 MG capsule 2 capsules as needed     AMBULATORY NON FORMULARY MEDICATION Take 1 capsule by mouth every morning. Vitamin A, D, K Take 1 capsule daily     apixaban (ELIQUIS) 5 MG TABS tablet Take 1 tablet (5 mg total) by mouth 2 (two) times daily. 180 tablet 3   betamethasone dipropionate 0.05 % cream Apply to affected area 2 times a day as needed 45 g 1   BIOTIN PO Take 5,000 mcg by mouth every morning.     Calcium Citrate-Vitamin D (CALCIUM CITRATE + D PO) Take 1 capsule by mouth in the morning and at bedtime.     flecainide (TAMBOCOR) 50 MG tablet TAKE 1 TABLET BY MOUTH 2 TIMES DAILY 180 tablet 2   levothyroxine (SYNTHROID) 50 MCG tablet TAKE 1 TABLET BY MOUTH EVERY MORNING ON AN EMPTY STOMACH 90 tablet 3   sertraline (ZOLOFT) 100  MG tablet Take 1/2 tablet by mouth daily 45 tablet 4   traZODone (DESYREL) 50 MG tablet Take 25 mg by mouth at bedtime.     No current facility-administered medications for this encounter.    Allergies  Allergen Reactions   Botox [Onabotulinumtoxina] Other (See Comments)    local eyelid drooping and headache   Pantoprazole Other (See Comments)    Made her feel bad.    Social History   Socioeconomic History   Marital status: Married    Spouse name: Not on file   Number of children: 2   Years of education: Not on file   Highest education level: Not on file  Occupational History   Occupation: retired   Tobacco Use   Smoking status: Never   Smokeless tobacco: Never  Vaping Use   Vaping Use: Never used  Substance and Sexual Activity   Alcohol use: Yes    Alcohol/week: 5.0 standard drinks of alcohol    Types: 5 Glasses of wine per week    Comment: 1 per day   Drug use: No   Sexual activity: Not Currently    Partners: Male    Birth control/protection: None, Surgical    Comment: TVH--still has ovaries  Other Topics Concern   Not on file  Social History Narrative   Pt lives in [place] with spouse.  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   Social Determinants of Health   Financial Resource Strain: Not on file  Food Insecurity: Not on file  Transportation Needs: Not on file  Physical Activity: Not on file  Stress: Not on file  Social Connections: Not on file  Intimate Partner Violence: Not on file    Family History  Problem Relation Age of Onset   Hypertension Mother    Thyroid disease Mother    Lung cancer Mother    Breast cancer Paternal Grandmother 54   Esophageal cancer Father    Thyroid disease Sister    Kidney disease Sister    Depression Son    Stroke Maternal Grandmother    Heart disease Maternal Grandmother    Breast cancer Niece 84   Stomach cancer Neg Hx    Rectal cancer Neg Hx    Colon cancer Neg Hx     ROS- All systems are reviewed and negative except as per the HPI above  Physical Exam: Vitals:   03/15/22 1129  BP: (!) 86/60  Pulse: 74  Weight: 48.6 kg  Height: '5\' 2"'$  (1.575 m)   Wt Readings from Last 3 Encounters:  03/15/22 48.6 kg  09/21/21 49.2 kg  05/02/21 49.4 kg    Labs: Lab Results  Component Value Date   NA 136 02/04/2020   K 4.2 02/04/2020   CL 99 02/04/2020   CO2 29 02/04/2020   GLUCOSE 90 02/04/2020   BUN 19 02/04/2020   CREATININE 0.68 02/04/2020   CALCIUM 9.5 02/04/2020   Lab Results  Component Value Date   INR 1.03 04/14/2015   Lab Results  Component Value Date   CHOL 250 (H) 09/22/2013   HDL 110.80  09/22/2013   TRIG 59.0 09/22/2013     GEN- The patient is well appearing, alert and oriented x 3 today.   Head- normocephalic, atraumatic Eyes-  Sclera clear, conjunctiva pink Ears- hearing intact Oropharynx- clear Neck- supple, no JVP Lymph- no cervical lymphadenopathy Lungs- Clear to ausculation bilaterally, normal work of breathing Heart- Regular rate and rhythm, no murmurs, rubs or gallops, PMI not laterally displaced GI-  soft, NT, ND, + BS Extremities- no clubbing, cyanosis, or edema MS- no significant deformity or atrophy Skin- no rash or lesion Psych- euthymic mood, full affect Neuro- strength and sensation are intact   Vent. rate 74 BPM PR interval 200 ms QRS duration 88 ms QT/QTcB 392/435 ms P-R-T axes 75 51 77 Normal sinus rhythm Possible Anterior infarct , age undetermined Abnormal ECG When compared with ECG of 21-Sep-2021 11:17, PREVIOUS ECG IS PRESENT   Assessment and Plan: 1. Paroxysmal  afib She is not aware of any afib Continue flecainide  50 mg bid, not on rate control for low normal BP   2. CHA2DS2VASc score of 2 Continue eliquis 5 mg bid   3. Hypotension Pt's norm Asymptomatic   F/u in 6 months  Butch Penny C. Litisha Guagliardo, [facility] [address] [place], [place] [address] [phone]

## 2022-03-16 ENCOUNTER — Other Ambulatory Visit (HOSPITAL_COMMUNITY): Payer: Self-pay

## 2022-03-20 ENCOUNTER — Ambulatory Visit
Admission: RE | Admit: 2022-03-20 | Discharge: 2022-03-20 | Disposition: A | Discharge status: Home / Self Care | Payer: PPO | Source: Ambulatory Visit | Attending: Physical Medicine and Rehabilitation | Admitting: Physical Medicine and Rehabilitation

## 2022-03-20 DIAGNOSIS — Z1231 Encounter for screening mammogram for malignant neoplasm of breast: Secondary | ICD-10-CM | POA: Diagnosis not present

## 2022-05-03 ENCOUNTER — Other Ambulatory Visit (HOSPITAL_COMMUNITY): Payer: Self-pay

## 2022-05-04 ENCOUNTER — Ambulatory Visit (INDEPENDENT_AMBULATORY_CARE_PROVIDER_SITE_OTHER): Payer: PPO | Admitting: Obstetrics & Gynecology

## 2022-05-04 ENCOUNTER — Encounter (HOSPITAL_BASED_OUTPATIENT_CLINIC_OR_DEPARTMENT_OTHER): Payer: Self-pay | Admitting: Obstetrics & Gynecology

## 2022-05-04 VITALS — BP 103/65 | HR 69 | Ht 62.0 in | Wt 106.0 lb

## 2022-05-04 DIAGNOSIS — Z780 Asymptomatic menopausal state: Secondary | ICD-10-CM

## 2022-05-04 DIAGNOSIS — Z9189 Other specified personal risk factors, not elsewhere classified: Secondary | ICD-10-CM

## 2022-05-04 DIAGNOSIS — M8580 Other specified disorders of bone density and structure, unspecified site: Secondary | ICD-10-CM

## 2022-05-04 DIAGNOSIS — Z90710 Acquired absence of both cervix and uterus: Secondary | ICD-10-CM | POA: Diagnosis not present

## 2022-05-04 NOTE — Progress Notes (Signed)
71 y.o. G27P2002 Married White or Caucasian female here for breast and pelvic exam.  I am also following her for osteopenia.  Has BMD scheduled for November but would like to do sooner if possible.  Denies vaginal bleeding.  Patient's last menstrual period was 08/08/1983 (approximate).          Sexually active: No.  H/O STD:  no  Health Maintenance: PCP:  Was Dr. Inda Merlin but will see a new provider.  Next appt is scheduled in November.  Will do blood work.     Vaccines are up to date:  yes Colonoscopy:  11/10/2015 MMG:  03/20/2022 Negative BMD:  03/16/2020 Osteopenia Last pap smear:  09/05/2017 Negative.   H/o abnormal pap smear:  no    reports that she has never smoked. She has never used smokeless tobacco. She reports current alcohol use of about 5.0 standard drinks of alcohol per week. She reports that she does not use drugs.  Past Medical History:  Diagnosis Date   Arthritis    lt shoulder   Atrial flutter ([place])    Depression    Dyspareunia    Fibroid    Hypothyroidism    Osteoporosis May, 2017   Paroxysmal atrial fibrillation [facility])     Past Surgical History:  Procedure Laterality Date   ABDOMINAL HYSTERECTOMY     ABLATION  12-23-2013   PVI and CTI by Dr Rayann Heman   ATRIAL FIBRILLATION ABLATION N/A 12/23/2013   Procedure: ATRIAL FIBRILLATION ABLATION;  Surgeon: Coralyn Mark, MD;  Location: [place] CATH LAB;  Service: Cardiovascular;  Laterality: N/A;   AUGMENTATION MAMMAPLASTY Bilateral    BREAST ENHANCEMENT SURGERY  83,11   COSMETIC SURGERY  2003   face   FLEXIBLE SIGMOIDOSCOPY N/A 07/29/2013   Procedure: FLEXIBLE SIGMOIDOSCOPY;  Surgeon: [name], MD;  [facility];  Service: Endoscopy;  Laterality: N/A;   FRACTURE SURGERY Right    wrist   TEE WITHOUT CARDIOVERSION N/A 12/22/2013   Procedure: TRANSESOPHAGEAL ECHOCARDIOGRAM (TEE);  Surgeon: [name], MD;  Location: [place];  Service: Cardiovascular;  Laterality: N/A;   TONSILLECTOMY     TOTAL  SHOULDER ARTHROPLASTY Left 04/23/2015   Procedure: LEFT TOTAL SHOULDER ARTHROPLASTY;  Surgeon: Netta Cedars, MD;  Location: [place];  Service: Orthopedics;  Laterality: Left;    Current Outpatient Medications  Medication Sig Dispense Refill   Acetaminophen (ACETAMINOPHEN EXTRA STRENGTH) 500 MG capsule 2 capsules as needed     AMBULATORY NON FORMULARY MEDICATION Take 1 capsule by mouth every morning. Vitamin A, D, K Take 1 capsule daily     apixaban (ELIQUIS) 5 MG TABS tablet Take 1 tablet (5 mg total) by mouth 2 (two) times daily. 180 tablet 3   betamethasone dipropionate 0.05 % cream Apply to affected area 2 times a day as needed 45 g 1   BIOTIN PO Take 5,000 mcg by mouth every morning.     Calcium Citrate-Vitamin D (CALCIUM CITRATE + D PO) Take 1 capsule by mouth in the morning and at bedtime.     flecainide (TAMBOCOR) 50 MG tablet TAKE 1 TABLET BY MOUTH 2 TIMES DAILY 180 tablet 2   levothyroxine (SYNTHROID) 50 MCG tablet TAKE 1 TABLET BY MOUTH EVERY MORNING ON AN EMPTY STOMACH 90 tablet 3   sertraline (ZOLOFT) 100 MG tablet Take 1/2 tablet by mouth daily 45 tablet 4   traZODone (DESYREL) 50 MG tablet TAKE 1 TABLET BY MOUTH AT BEDTIME AS NEEDED 90 tablet 3   traZODone (DESYREL)  50 MG tablet Take 25 mg by mouth at bedtime.     No current facility-administered medications for this visit.    Family History  Problem Relation Age of Onset   Hypertension Mother    Thyroid disease Mother    Lung cancer Mother    Breast cancer Paternal Grandmother 13   Esophageal cancer Father    Thyroid disease Sister    Kidney disease Sister    Depression Son    Stroke Maternal Grandmother    Heart disease Maternal Grandmother    Breast cancer Niece 88   Stomach cancer Neg Hx    Rectal cancer Neg Hx    Colon cancer Neg Hx     Review of Systems  Constitutional: Negative.   Genitourinary: Negative.     Exam:   BP 103/65 (BP Location: Right Arm, Patient Position: Sitting, Cuff Size: Normal)    Pulse 69   Ht '5\' 2"'$  (1.575 m)   Wt 106 lb (48.1 kg)   LMP 08/08/1983 (Approximate)   BMI 19.39 kg/m   Height: '5\' 2"'$  (157.5 cm)  General appearance: alert, cooperative and appears stated age Breasts: normal appearance, no masses or tenderness Abdomen: soft, non-tender; bowel sounds normal; no masses,  no organomegaly Lymph nodes: Cervical, supraclavicular, and axillary nodes normal.  No abnormal inguinal nodes palpated Neurologic: Grossly normal  Pelvic: External genitalia:  no lesions              Urethra:  normal appearing urethra with no masses, tenderness or lesions              Bartholins and Skenes: normal                 Vagina: normal appearing vagina with atrophic changes and no discharge, no lesions              Cervix: absent              Pap taken: No. Bimanual Exam:  Uterus:  uterus absent              Adnexa: no mass, fullness, tenderness               Rectovaginal: Confirms               Anus:  normal sphincter tone, no lesions  Chaperone, Octaviano Batty, CMA, was present for exam.  Assessment/Plan: 1. GYN exam for high-risk Medicare patient - Pap smear not indicated - Mammogram 03/2022 - Colonoscopy 2017 - Bone mineral density scheduled but pt would like to do sooner - lab work done done with PCP - vaccines reviewed/updated  2. Postmenopausal - no HRT  3. History of hysterectomy - ovaries remain  4. Osteopenia, unspecified location

## 2022-05-05 DIAGNOSIS — Z961 Presence of intraocular lens: Secondary | ICD-10-CM | POA: Diagnosis not present

## 2022-05-05 DIAGNOSIS — H04123 Dry eye syndrome of bilateral lacrimal glands: Secondary | ICD-10-CM | POA: Diagnosis not present

## 2022-05-11 ENCOUNTER — Other Ambulatory Visit (HOSPITAL_BASED_OUTPATIENT_CLINIC_OR_DEPARTMENT_OTHER): Payer: PPO

## 2022-05-15 ENCOUNTER — Ambulatory Visit (HOSPITAL_BASED_OUTPATIENT_CLINIC_OR_DEPARTMENT_OTHER)
Admission: RE | Admit: 2022-05-15 | Discharge: 2022-05-15 | Disposition: A | Discharge status: Home / Self Care | Payer: PPO | Source: Ambulatory Visit | Attending: Obstetrics & Gynecology | Admitting: Obstetrics & Gynecology

## 2022-05-15 DIAGNOSIS — M8580 Other specified disorders of bone density and structure, unspecified site: Secondary | ICD-10-CM | POA: Insufficient documentation

## 2022-05-15 DIAGNOSIS — Z780 Asymptomatic menopausal state: Secondary | ICD-10-CM | POA: Insufficient documentation

## 2022-05-15 DIAGNOSIS — M810 Age-related osteoporosis without current pathological fracture: Secondary | ICD-10-CM | POA: Diagnosis not present

## 2022-05-19 ENCOUNTER — Other Ambulatory Visit (HOSPITAL_COMMUNITY): Payer: Self-pay

## 2022-05-24 ENCOUNTER — Encounter (HOSPITAL_BASED_OUTPATIENT_CLINIC_OR_DEPARTMENT_OTHER): Payer: Self-pay | Admitting: Obstetrics & Gynecology

## 2022-06-15 ENCOUNTER — Other Ambulatory Visit (HOSPITAL_COMMUNITY): Payer: Self-pay

## 2022-07-12 DIAGNOSIS — Z0000 Encounter for general adult medical examination without abnormal findings: Secondary | ICD-10-CM | POA: Diagnosis not present

## 2022-07-12 DIAGNOSIS — M810 Age-related osteoporosis without current pathological fracture: Secondary | ICD-10-CM | POA: Diagnosis not present

## 2022-07-12 DIAGNOSIS — F3342 Major depressive disorder, recurrent, in full remission: Secondary | ICD-10-CM | POA: Diagnosis not present

## 2022-07-12 DIAGNOSIS — I480 Paroxysmal atrial fibrillation: Secondary | ICD-10-CM | POA: Diagnosis not present

## 2022-07-12 DIAGNOSIS — D6869 Other thrombophilia: Secondary | ICD-10-CM | POA: Diagnosis not present

## 2022-07-12 DIAGNOSIS — E039 Hypothyroidism, unspecified: Secondary | ICD-10-CM | POA: Diagnosis not present

## 2022-07-12 DIAGNOSIS — Z79899 Other long term (current) drug therapy: Secondary | ICD-10-CM | POA: Diagnosis not present

## 2022-07-12 DIAGNOSIS — E559 Vitamin D deficiency, unspecified: Secondary | ICD-10-CM | POA: Diagnosis not present

## 2022-07-12 DIAGNOSIS — E782 Mixed hyperlipidemia: Secondary | ICD-10-CM | POA: Diagnosis not present

## 2022-07-18 ENCOUNTER — Other Ambulatory Visit: Payer: PPO

## 2022-08-02 ENCOUNTER — Other Ambulatory Visit (HOSPITAL_COMMUNITY): Payer: Self-pay

## 2022-08-11 ENCOUNTER — Other Ambulatory Visit (HOSPITAL_COMMUNITY): Payer: Self-pay

## 2022-08-11 DIAGNOSIS — M545 Low back pain, unspecified: Secondary | ICD-10-CM | POA: Diagnosis not present

## 2022-08-11 MED ORDER — PREDNISONE 5 MG (21) PO TBPK
ORAL_TABLET | ORAL | 0 refills | Status: DC
  Filled 2022-08-11: qty 21, 6d supply, fill #0

## 2022-08-11 MED ORDER — CYCLOBENZAPRINE HCL 5 MG PO TABS
ORAL_TABLET | ORAL | 0 refills | Status: DC
  Filled 2022-08-11: qty 14, 7d supply, fill #0

## 2022-08-21 ENCOUNTER — Other Ambulatory Visit (HOSPITAL_COMMUNITY): Payer: Self-pay

## 2022-08-21 ENCOUNTER — Other Ambulatory Visit: Payer: Self-pay

## 2022-08-22 ENCOUNTER — Other Ambulatory Visit (HOSPITAL_COMMUNITY): Payer: Self-pay

## 2022-08-22 MED ORDER — TRETINOIN 0.05 % EX CREA
TOPICAL_CREAM | CUTANEOUS | 3 refills | Status: AC
  Filled 2022-08-22: qty 45, 30d supply, fill #0

## 2022-08-24 ENCOUNTER — Other Ambulatory Visit (HOSPITAL_COMMUNITY): Payer: Self-pay

## 2022-09-06 ENCOUNTER — Other Ambulatory Visit: Payer: Self-pay

## 2022-09-06 ENCOUNTER — Other Ambulatory Visit (HOSPITAL_COMMUNITY): Payer: Self-pay

## 2022-09-07 DEATH — deceased

## 2022-09-13 ENCOUNTER — Other Ambulatory Visit: Payer: Self-pay

## 2022-09-13 ENCOUNTER — Other Ambulatory Visit (HOSPITAL_COMMUNITY): Payer: Self-pay

## 2022-09-14 ENCOUNTER — Encounter (HOSPITAL_COMMUNITY): Payer: Self-pay | Admitting: *Deleted

## 2022-09-15 ENCOUNTER — Other Ambulatory Visit (HOSPITAL_COMMUNITY): Payer: Self-pay

## 2022-09-15 MED ORDER — LEVOTHYROXINE SODIUM 50 MCG PO TABS
50.0000 ug | ORAL_TABLET | Freq: Every morning | ORAL | 3 refills | Status: DC
  Filled 2022-09-15: qty 100, 100d supply, fill #0

## 2022-09-26 ENCOUNTER — Other Ambulatory Visit (HOSPITAL_COMMUNITY): Payer: Self-pay

## 2022-11-02 ENCOUNTER — Other Ambulatory Visit (HOSPITAL_COMMUNITY): Payer: Self-pay

## 2022-11-02 ENCOUNTER — Other Ambulatory Visit (HOSPITAL_COMMUNITY): Payer: Self-pay | Admitting: Nurse Practitioner

## 2022-11-02 ENCOUNTER — Other Ambulatory Visit: Payer: Self-pay

## 2022-11-02 DIAGNOSIS — E039 Hypothyroidism, unspecified: Secondary | ICD-10-CM | POA: Diagnosis not present

## 2022-11-02 MED ORDER — CYCLOBENZAPRINE HCL 5 MG PO TABS
ORAL_TABLET | ORAL | 0 refills | Status: DC
  Filled 2022-11-02: qty 14, 7d supply, fill #0

## 2022-11-02 MED ORDER — FLECAINIDE ACETATE 50 MG PO TABS
50.0000 mg | ORAL_TABLET | Freq: Two times a day (BID) | ORAL | 2 refills | Status: DC
  Filled 2022-11-02: qty 200, 100d supply, fill #0
  Filled 2023-02-09: qty 200, 100d supply, fill #1
  Filled 2023-05-21: qty 200, 100d supply, fill #2

## 2022-11-02 MED ORDER — LEVOTHYROXINE SODIUM 75 MCG PO TABS
ORAL_TABLET | ORAL | 2 refills | Status: AC
  Filled 2022-11-02: qty 15, 30d supply, fill #0
  Filled 2022-11-25: qty 15, 30d supply, fill #1

## 2022-11-03 ENCOUNTER — Other Ambulatory Visit (HOSPITAL_COMMUNITY): Payer: Self-pay

## 2022-11-25 ENCOUNTER — Other Ambulatory Visit (HOSPITAL_COMMUNITY): Payer: Self-pay

## 2022-11-27 ENCOUNTER — Other Ambulatory Visit (HOSPITAL_COMMUNITY): Payer: Self-pay

## 2022-12-12 ENCOUNTER — Other Ambulatory Visit (HOSPITAL_COMMUNITY): Payer: Self-pay

## 2022-12-19 DIAGNOSIS — E039 Hypothyroidism, unspecified: Secondary | ICD-10-CM | POA: Diagnosis not present

## 2022-12-19 DIAGNOSIS — Z5181 Encounter for therapeutic drug level monitoring: Secondary | ICD-10-CM | POA: Diagnosis not present

## 2022-12-22 ENCOUNTER — Other Ambulatory Visit (HOSPITAL_COMMUNITY): Payer: Self-pay

## 2022-12-22 MED ORDER — MUPIROCIN 2 % EX OINT
1.0000 | TOPICAL_OINTMENT | Freq: Two times a day (BID) | CUTANEOUS | 1 refills | Status: AC
  Filled 2022-12-22: qty 22, 11d supply, fill #0

## 2023-01-07 ENCOUNTER — Other Ambulatory Visit (HOSPITAL_COMMUNITY): Payer: Self-pay

## 2023-01-10 ENCOUNTER — Other Ambulatory Visit (HOSPITAL_COMMUNITY): Payer: Self-pay

## 2023-01-10 MED ORDER — SERTRALINE HCL 100 MG PO TABS
50.0000 mg | ORAL_TABLET | Freq: Every day | ORAL | 1 refills | Status: DC
  Filled 2023-01-10: qty 45, 90d supply, fill #0
  Filled 2023-04-05: qty 45, 90d supply, fill #1

## 2023-01-12 DIAGNOSIS — B079 Viral wart, unspecified: Secondary | ICD-10-CM | POA: Diagnosis not present

## 2023-01-23 ENCOUNTER — Other Ambulatory Visit (HOSPITAL_COMMUNITY): Payer: Self-pay

## 2023-01-24 ENCOUNTER — Other Ambulatory Visit (HOSPITAL_COMMUNITY): Payer: Self-pay

## 2023-01-24 MED ORDER — LEVOTHYROXINE SODIUM 50 MCG PO TABS
ORAL_TABLET | ORAL | 3 refills | Status: AC
  Filled 2023-01-24: qty 125, 100d supply, fill #0
  Filled 2023-04-25 – 2023-04-28 (×2): qty 125, 100d supply, fill #1
  Filled 2023-04-28: qty 90, 36d supply, fill #1
  Filled 2023-04-28 (×3): qty 125, 100d supply, fill #1
  Filled 2023-08-06: qty 125, 100d supply, fill #2
  Filled 2023-11-08: qty 125, 100d supply, fill #3

## 2023-01-25 DIAGNOSIS — B079 Viral wart, unspecified: Secondary | ICD-10-CM | POA: Diagnosis not present

## 2023-02-06 ENCOUNTER — Other Ambulatory Visit: Payer: Self-pay | Admitting: Physical Medicine and Rehabilitation

## 2023-02-06 DIAGNOSIS — Z1231 Encounter for screening mammogram for malignant neoplasm of breast: Secondary | ICD-10-CM

## 2023-02-09 DIAGNOSIS — B079 Viral wart, unspecified: Secondary | ICD-10-CM | POA: Diagnosis not present

## 2023-02-09 DIAGNOSIS — F3342 Major depressive disorder, recurrent, in full remission: Secondary | ICD-10-CM | POA: Diagnosis not present

## 2023-02-09 DIAGNOSIS — D6869 Other thrombophilia: Secondary | ICD-10-CM | POA: Diagnosis not present

## 2023-02-09 DIAGNOSIS — I480 Paroxysmal atrial fibrillation: Secondary | ICD-10-CM | POA: Diagnosis not present

## 2023-02-09 DIAGNOSIS — M810 Age-related osteoporosis without current pathological fracture: Secondary | ICD-10-CM | POA: Diagnosis not present

## 2023-02-21 ENCOUNTER — Other Ambulatory Visit: Payer: Self-pay

## 2023-02-21 ENCOUNTER — Other Ambulatory Visit (HOSPITAL_COMMUNITY): Payer: Self-pay

## 2023-02-21 MED ORDER — ELIQUIS 5 MG PO TABS
5.0000 mg | ORAL_TABLET | Freq: Two times a day (BID) | ORAL | 3 refills | Status: DC
  Filled 2023-02-21: qty 180, 90d supply, fill #0
  Filled 2023-02-22: qty 200, 100d supply, fill #0
  Filled 2023-06-05: qty 200, 100d supply, fill #1
  Filled 2023-09-10: qty 200, 100d supply, fill #2
  Filled 2023-12-18: qty 120, 60d supply, fill #3

## 2023-02-22 ENCOUNTER — Other Ambulatory Visit (HOSPITAL_COMMUNITY): Payer: Self-pay

## 2023-03-06 ENCOUNTER — Other Ambulatory Visit (HOSPITAL_COMMUNITY): Payer: Self-pay

## 2023-03-07 ENCOUNTER — Other Ambulatory Visit (HOSPITAL_COMMUNITY): Payer: Self-pay

## 2023-03-07 MED ORDER — TRAZODONE HCL 50 MG PO TABS
50.0000 mg | ORAL_TABLET | Freq: Every evening | ORAL | 0 refills | Status: DC | PRN
  Filled 2023-03-07: qty 90, 90d supply, fill #0

## 2023-03-14 ENCOUNTER — Encounter (HOSPITAL_COMMUNITY): Payer: Self-pay | Admitting: Internal Medicine

## 2023-03-14 ENCOUNTER — Ambulatory Visit (HOSPITAL_COMMUNITY)
Admission: RE | Admit: 2023-03-14 | Discharge: 2023-03-14 | Disposition: A | Discharge status: Home / Self Care | Payer: HMO | Source: Ambulatory Visit | Attending: Internal Medicine | Admitting: Internal Medicine

## 2023-03-14 VITALS — BP 114/78 | HR 71 | Ht 62.0 in | Wt 104.8 lb

## 2023-03-14 DIAGNOSIS — I959 Hypotension, unspecified: Secondary | ICD-10-CM | POA: Diagnosis not present

## 2023-03-14 DIAGNOSIS — Z7901 Long term (current) use of anticoagulants: Secondary | ICD-10-CM | POA: Diagnosis not present

## 2023-03-14 DIAGNOSIS — I480 Paroxysmal atrial fibrillation: Secondary | ICD-10-CM

## 2023-03-14 DIAGNOSIS — I4891 Unspecified atrial fibrillation: Secondary | ICD-10-CM | POA: Diagnosis not present

## 2023-03-14 NOTE — Progress Notes (Signed)
Primary Care Physician: Marden Noble, MD (Inactive) Referring Physician: formerly Dr. Candee Furbish is a 72 y.o. female with a h/o paroxymal afib that is in the afib clinic for f/u. She is on flecainide and is staying in SR. She does not notice any afib. She does have a low normal BP and is not on any rate control. She is volunteering in the gift show at the hospital now.   F/u in afib clinic, 03/17/21. She  has not noted any afib. No bleeding issues with anticoagulation. Remains on flecainide 50 mg bid and eliquis 5 mg bid for a CHA2DS2VASc score of 2.   F/u in afib clinic, 09/21/21.she is doing well staying in SR with flecainide 50 mg bid. No issues with anticoagulation.   F/u in afib clinic for flecainide surveillance, 03/15/22. Doing great, no afib to report. No issues with anticoagulation. Runs a low BP, not symptomatic with this.   F/u in Afib clinic, 03/14/23. She is currently in NSR. She is taking flecainide 50 mg BID. She is not on rate control agent due to hypotension. She does not have bleeding issues on Eliquis 5 mg BID She notes brief episodes less than 1 minute in duration a couple of times per week. She says they don't really bother her. Her husband has a Kardiamobile device but does not use it.    Today, she denies symptoms of palpitations, chest pain, shortness of breath, orthopnea, PND, lower extremity edema, dizziness, presyncope, syncope, or neurologic sequela. The patient is tolerating medications without difficulties and is otherwise without complaint today.   Past Medical History:  Diagnosis Date   Arthritis    lt shoulder   Atrial flutter (HCC)    Depression    Dyspareunia    Fibroid    Hypothyroidism    Osteoporosis May, 2017   Paroxysmal atrial fibrillation [facility])    Past Surgical History:  Procedure Laterality Date   ABDOMINAL HYSTERECTOMY     ABLATION  12-23-2013   PVI and CTI by Dr Johney Frame   ATRIAL FIBRILLATION ABLATION N/A 12/23/2013   Procedure:  ATRIAL FIBRILLATION ABLATION;  Surgeon: Gardiner Rhyme, MD;  Location: [facility] CATH LAB;  Service: Cardiovascular;  Laterality: N/A;   AUGMENTATION MAMMAPLASTY Bilateral    BREAST ENHANCEMENT SURGERY  83,11   COSMETIC SURGERY  2003   face   FLEXIBLE SIGMOIDOSCOPY N/A 07/29/2013   Procedure: FLEXIBLE SIGMOIDOSCOPY;  Surgeon: Charolett Bumpers, MD;  Location: [facility] ENDOSCOPY;  Service: Endoscopy;  Laterality: N/A;   FRACTURE SURGERY Right    wrist   TEE WITHOUT CARDIOVERSION N/A 12/22/2013   Procedure: TRANSESOPHAGEAL ECHOCARDIOGRAM (TEE);  Surgeon: Wendall Stade, MD;  [facility] ENDOSCOPY;  Service: Cardiovascular;  Laterality: N/A;   TONSILLECTOMY     TOTAL SHOULDER ARTHROPLASTY Left 04/23/2015   Procedure: LEFT TOTAL SHOULDER ARTHROPLASTY;  Surgeon: Beverely Low, MD;  Location: [facility] OR;  Service: Orthopedics;  Laterality: Left;    Current Outpatient Medications  Medication Sig Dispense Refill   Acetaminophen (ACETAMINOPHEN EXTRA STRENGTH) 500 MG capsule 2 capsules as needed     AMBULATORY NON FORMULARY MEDICATION Take 1 capsule by mouth every morning. Vitamin A, D, K Take 1 capsule daily     apixaban (ELIQUIS) 5 MG TABS tablet Take 1 tablet (5 mg total) by mouth 2 (two) times daily. 180 tablet 3   betamethasone dipropionate 0.05 % cream Apply to affected area 2 times a day as needed 45 g 1   BIOTIN  PO Take 5,000 mcg by mouth every morning.     Calcium Citrate-Vitamin D (CALCIUM CITRATE + D PO) Take 1 capsule by mouth in the morning and at bedtime.     cyclobenzaprine (FLEXERIL) 5 MG tablet Take 1 tablet by mouth twice daily as needed, start at bedtime in case of drowsiness 14 tablet 0   flecainide (TAMBOCOR) 50 MG tablet Take 1 tablet (50 mg) by mouth 2 times daily. 200 tablet 2   levothyroxine (SYNTHROID) 50 MCG tablet TAKE 1 TABLET BY MOUTH EVERY MORNING ON AN EMPTY STOMACH 90 tablet 3   levothyroxine (SYNTHROID) 50 MCG tablet Take 1 tablet (50 mcg total) by mouth every morning on an empty  stomach 100 tablet 3   levothyroxine (SYNTHROID) 50 MCG tablet alternate 1 tablet and 1 1/2 tablets every other day 125 tablet 3   levothyroxine (SYNTHROID) 75 MCG tablet Take 1 tablet by mouth in the morning on an empty stomach every other day (alternating with ) 30 days 15 tablet 2   sertraline (ZOLOFT) 100 MG tablet Take 1/2 tablet by mouth daily 45 tablet 4   sertraline (ZOLOFT) 100 MG tablet Take 1/2 tablet (50 mg total) by mouth daily. 45 tablet 1   traZODone (DESYREL) 50 MG tablet TAKE 1 TABLET BY MOUTH AT BEDTIME AS NEEDED 90 tablet 3   traZODone (DESYREL) 50 MG tablet Take 1 tablet (50 mg total) by mouth at bedtime as needed. 90 tablet 0   tretinoin (RETIN-A) 0.05 % cream Apply a small amount to skin every night. Start 2 - 3 times per week and increase use as tolerated. 45 g 3   No current facility-administered medications for this encounter.    Allergies  Allergen Reactions   Botox [Onabotulinumtoxina] Other (See Comments)    local eyelid drooping and headache   Pantoprazole Other (See Comments)    Made her feel bad.    ROS- All systems are reviewed and negative except as per the HPI above  Physical Exam: Vitals:   03/14/23 1313  BP: 114/78  Pulse: 71  Weight: 47.5 kg  Height: 5\' 2"  (1.575 m)    Wt Readings from Last 3 Encounters:  03/14/23 47.5 kg  05/04/22 48.1 kg  03/15/22 48.6 kg    Labs: Lab Results  Component Value Date   NA 136 02/04/2020   K 4.2 02/04/2020   CL 99 02/04/2020   CO2 29 02/04/2020   GLUCOSE 90 02/04/2020   BUN 19 02/04/2020   CREATININE 0.68 02/04/2020   CALCIUM 9.5 02/04/2020   Lab Results  Component Value Date   INR 1.03 04/14/2015   Lab Results  Component Value Date   CHOL 250 (H) 09/22/2013   HDL 110.80 09/22/2013   TRIG 59.0 09/22/2013    GEN- The patient is well appearing, alert and oriented x 3 today.   Neck - no JVD or carotid bruit noted Lungs- Clear to ausculation bilaterally, normal work of breathing Heart-  Regular rate and rhythm, no murmurs, rubs or gallops, PMI not laterally displaced Extremities- no clubbing, cyanosis, or edema Skin - no rash or ecchymosis noted  ECG Vent. rate 71 BPM PR interval 214 ms QRS duration 86 ms QT/QTcB 388/421 ms P-R-T axes 86 58 82 Sinus rhythm with sinus arrhythmia with 1st degree A-V block Otherwise normal ECG When compared with ECG of 15-Mar-2022 11:59, PREVIOUS ECG IS PRESENT  ECHO (TEE) 12/22/13: Study Conclusions   - Left ventricle: The cavity size was normal. Wall thickness was  normal. Systolic function was normal. The estimated ejection    fraction was in the range of 55% to 60%.  - Aortic valve: There was mild regurgitation.  - Mitral valve: There was mild regurgitation.  - Left atrium: The atrium was dilated. No evidence of thrombus in    the atrial cavity or appendage.  - Right atrium: No evidence of thrombus in the atrial cavity or    appendage.  - Atrial septum: No defect or patent foramen ovale was identified.  - Tricuspid valve: No evidence of vegetation. There was moderate    regurgitation.  - Pulmonic valve: No evidence of vegetation.    Assessment and Plan: 1. Paroxysmal  afib  She is in NSR.  She notes brief episodes weekly but states it does not really bother her. I advised Kardiamobile monitoring at home and if episodes lengthen/worsen to consider increase in flecainide. We will continue currently same medications without change.  Intervals are stable. Continue flecainide  50 mg bid, not on rate control for low normal BP   2. CHA2DS2VASc score of 2 Continue eliquis 5 mg bid   3. Hypotension Asymptomatic, no changes at this time.   F/u in 6 months  Lake Bells, PA-C Afib Clinic [facility] [address] [place], [place] [address] [phone]

## 2023-03-20 DIAGNOSIS — F325 Major depressive disorder, single episode, in full remission: Secondary | ICD-10-CM | POA: Diagnosis not present

## 2023-03-20 DIAGNOSIS — B079 Viral wart, unspecified: Secondary | ICD-10-CM | POA: Diagnosis not present

## 2023-03-20 DIAGNOSIS — I4891 Unspecified atrial fibrillation: Secondary | ICD-10-CM | POA: Diagnosis not present

## 2023-03-22 ENCOUNTER — Ambulatory Visit
Admission: RE | Admit: 2023-03-22 | Discharge: 2023-03-22 | Disposition: A | Discharge status: Home / Self Care | Payer: HMO | Source: Ambulatory Visit | Attending: Physical Medicine and Rehabilitation | Admitting: Physical Medicine and Rehabilitation

## 2023-03-22 DIAGNOSIS — Z1231 Encounter for screening mammogram for malignant neoplasm of breast: Secondary | ICD-10-CM | POA: Diagnosis not present

## 2023-04-05 ENCOUNTER — Telehealth (HOSPITAL_BASED_OUTPATIENT_CLINIC_OR_DEPARTMENT_OTHER): Payer: Self-pay | Admitting: *Deleted

## 2023-04-05 NOTE — Telephone Encounter (Signed)
Pt left message on nurse line requesting order for bone density test. Attempted to call pt back. LMOVM

## 2023-04-06 ENCOUNTER — Other Ambulatory Visit (HOSPITAL_COMMUNITY): Payer: Self-pay

## 2023-04-09 ENCOUNTER — Other Ambulatory Visit (HOSPITAL_COMMUNITY): Payer: Self-pay

## 2023-04-28 ENCOUNTER — Other Ambulatory Visit (HOSPITAL_COMMUNITY): Payer: Self-pay

## 2023-04-30 ENCOUNTER — Other Ambulatory Visit (HOSPITAL_COMMUNITY): Payer: Self-pay

## 2023-05-08 ENCOUNTER — Other Ambulatory Visit (HOSPITAL_COMMUNITY): Payer: Self-pay

## 2023-05-08 DIAGNOSIS — K130 Diseases of lips: Secondary | ICD-10-CM | POA: Diagnosis not present

## 2023-05-08 DIAGNOSIS — L570 Actinic keratosis: Secondary | ICD-10-CM | POA: Diagnosis not present

## 2023-05-08 DIAGNOSIS — B078 Other viral warts: Secondary | ICD-10-CM | POA: Diagnosis not present

## 2023-05-08 DIAGNOSIS — L821 Other seborrheic keratosis: Secondary | ICD-10-CM | POA: Diagnosis not present

## 2023-05-08 MED ORDER — NYSTATIN 100000 UNIT/GM EX OINT
TOPICAL_OINTMENT | CUTANEOUS | 1 refills | Status: DC
  Filled 2023-05-08: qty 30, 30d supply, fill #0

## 2023-05-09 ENCOUNTER — Other Ambulatory Visit (HOSPITAL_COMMUNITY): Payer: Self-pay

## 2023-05-11 ENCOUNTER — Other Ambulatory Visit (HOSPITAL_BASED_OUTPATIENT_CLINIC_OR_DEPARTMENT_OTHER): Payer: Self-pay | Admitting: Obstetrics & Gynecology

## 2023-05-17 DIAGNOSIS — Z961 Presence of intraocular lens: Secondary | ICD-10-CM | POA: Diagnosis not present

## 2023-05-21 ENCOUNTER — Other Ambulatory Visit (HOSPITAL_COMMUNITY): Payer: Self-pay

## 2023-06-05 ENCOUNTER — Other Ambulatory Visit (HOSPITAL_COMMUNITY): Payer: Self-pay

## 2023-06-26 ENCOUNTER — Other Ambulatory Visit (HOSPITAL_COMMUNITY): Payer: Self-pay

## 2023-07-02 ENCOUNTER — Other Ambulatory Visit (HOSPITAL_COMMUNITY): Payer: Self-pay

## 2023-07-02 MED ORDER — SERTRALINE HCL 100 MG PO TABS
50.0000 mg | ORAL_TABLET | Freq: Every day | ORAL | 0 refills | Status: DC
  Filled 2023-07-02: qty 45, 90d supply, fill #0

## 2023-07-07 ENCOUNTER — Other Ambulatory Visit (HOSPITAL_COMMUNITY): Payer: Self-pay

## 2023-07-09 ENCOUNTER — Other Ambulatory Visit (HOSPITAL_COMMUNITY): Payer: Self-pay

## 2023-07-13 DIAGNOSIS — E782 Mixed hyperlipidemia: Secondary | ICD-10-CM | POA: Diagnosis not present

## 2023-07-13 DIAGNOSIS — E559 Vitamin D deficiency, unspecified: Secondary | ICD-10-CM | POA: Diagnosis not present

## 2023-07-13 DIAGNOSIS — Z79899 Other long term (current) drug therapy: Secondary | ICD-10-CM | POA: Diagnosis not present

## 2023-07-13 DIAGNOSIS — E039 Hypothyroidism, unspecified: Secondary | ICD-10-CM | POA: Diagnosis not present

## 2023-07-17 ENCOUNTER — Other Ambulatory Visit (HOSPITAL_COMMUNITY): Payer: Self-pay

## 2023-07-17 DIAGNOSIS — Z0000 Encounter for general adult medical examination without abnormal findings: Secondary | ICD-10-CM | POA: Diagnosis not present

## 2023-07-17 DIAGNOSIS — E039 Hypothyroidism, unspecified: Secondary | ICD-10-CM | POA: Diagnosis not present

## 2023-07-17 DIAGNOSIS — G8929 Other chronic pain: Secondary | ICD-10-CM | POA: Diagnosis not present

## 2023-07-17 DIAGNOSIS — Z1331 Encounter for screening for depression: Secondary | ICD-10-CM | POA: Diagnosis not present

## 2023-07-17 DIAGNOSIS — Z79899 Other long term (current) drug therapy: Secondary | ICD-10-CM | POA: Diagnosis not present

## 2023-07-17 DIAGNOSIS — F3342 Major depressive disorder, recurrent, in full remission: Secondary | ICD-10-CM | POA: Diagnosis not present

## 2023-07-17 DIAGNOSIS — I480 Paroxysmal atrial fibrillation: Secondary | ICD-10-CM | POA: Diagnosis not present

## 2023-07-17 DIAGNOSIS — M810 Age-related osteoporosis without current pathological fracture: Secondary | ICD-10-CM | POA: Diagnosis not present

## 2023-07-17 DIAGNOSIS — E559 Vitamin D deficiency, unspecified: Secondary | ICD-10-CM | POA: Diagnosis not present

## 2023-07-17 DIAGNOSIS — D6869 Other thrombophilia: Secondary | ICD-10-CM | POA: Diagnosis not present

## 2023-07-17 DIAGNOSIS — E782 Mixed hyperlipidemia: Secondary | ICD-10-CM | POA: Diagnosis not present

## 2023-07-17 DIAGNOSIS — M545 Low back pain, unspecified: Secondary | ICD-10-CM | POA: Diagnosis not present

## 2023-07-17 MED ORDER — CYCLOBENZAPRINE HCL 5 MG PO TABS
5.0000 mg | ORAL_TABLET | Freq: Two times a day (BID) | ORAL | 0 refills | Status: DC | PRN
  Filled 2023-07-17: qty 30, 15d supply, fill #0

## 2023-07-18 ENCOUNTER — Other Ambulatory Visit (HOSPITAL_COMMUNITY): Payer: Self-pay

## 2023-08-06 ENCOUNTER — Other Ambulatory Visit: Payer: Self-pay

## 2023-08-06 ENCOUNTER — Other Ambulatory Visit (HOSPITAL_COMMUNITY): Payer: Self-pay

## 2023-08-06 MED ORDER — TRAZODONE HCL 50 MG PO TABS
25.0000 mg | ORAL_TABLET | Freq: Every evening | ORAL | 3 refills | Status: DC | PRN
  Filled 2023-08-06: qty 45, 90d supply, fill #0
  Filled 2023-11-08: qty 45, 90d supply, fill #1
  Filled 2024-02-01: qty 45, 90d supply, fill #2
  Filled 2024-04-24: qty 45, 90d supply, fill #3

## 2023-08-14 ENCOUNTER — Other Ambulatory Visit (HOSPITAL_COMMUNITY): Payer: Self-pay

## 2023-08-28 ENCOUNTER — Other Ambulatory Visit (HOSPITAL_COMMUNITY): Payer: Self-pay | Admitting: Physician Assistant

## 2023-08-28 ENCOUNTER — Other Ambulatory Visit (HOSPITAL_COMMUNITY): Payer: Self-pay

## 2023-08-28 MED ORDER — FLECAINIDE ACETATE 50 MG PO TABS
50.0000 mg | ORAL_TABLET | Freq: Two times a day (BID) | ORAL | 2 refills | Status: DC
  Filled 2023-08-28: qty 200, 100d supply, fill #0
  Filled 2023-12-14: qty 200, 100d supply, fill #1
  Filled 2024-03-24: qty 200, 100d supply, fill #2

## 2023-09-10 ENCOUNTER — Other Ambulatory Visit (HOSPITAL_COMMUNITY): Payer: Self-pay

## 2023-09-11 ENCOUNTER — Other Ambulatory Visit (HOSPITAL_COMMUNITY): Payer: Self-pay

## 2023-09-13 ENCOUNTER — Ambulatory Visit (HOSPITAL_COMMUNITY): Payer: HMO | Admitting: Internal Medicine

## 2023-09-18 ENCOUNTER — Ambulatory Visit (HOSPITAL_COMMUNITY): Payer: HMO | Admitting: Internal Medicine

## 2023-09-25 ENCOUNTER — Other Ambulatory Visit (HOSPITAL_COMMUNITY): Payer: Self-pay

## 2023-09-25 ENCOUNTER — Other Ambulatory Visit: Payer: Self-pay

## 2023-09-25 ENCOUNTER — Ambulatory Visit (HOSPITAL_COMMUNITY): Payer: HMO | Admitting: Internal Medicine

## 2023-09-25 MED ORDER — SERTRALINE HCL 100 MG PO TABS
50.0000 mg | ORAL_TABLET | Freq: Every day | ORAL | 3 refills | Status: AC
  Filled 2023-09-25: qty 45, 90d supply, fill #0
  Filled 2023-12-21: qty 45, 90d supply, fill #1
  Filled 2024-03-24: qty 45, 90d supply, fill #2
  Filled 2024-06-25: qty 45, 90d supply, fill #3

## 2023-10-01 ENCOUNTER — Ambulatory Visit (HOSPITAL_COMMUNITY)
Admission: RE | Admit: 2023-10-01 | Discharge: 2023-10-01 | Disposition: A | Discharge status: Home / Self Care | Payer: HMO | Source: Ambulatory Visit | Attending: Internal Medicine | Admitting: Internal Medicine

## 2023-10-01 VITALS — BP 104/64 | HR 68 | Ht 62.0 in | Wt 108.6 lb

## 2023-10-01 DIAGNOSIS — Z79899 Other long term (current) drug therapy: Secondary | ICD-10-CM | POA: Diagnosis not present

## 2023-10-01 DIAGNOSIS — I480 Paroxysmal atrial fibrillation: Secondary | ICD-10-CM | POA: Insufficient documentation

## 2023-10-01 DIAGNOSIS — Z5181 Encounter for therapeutic drug level monitoring: Secondary | ICD-10-CM | POA: Insufficient documentation

## 2023-10-01 DIAGNOSIS — I959 Hypotension, unspecified: Secondary | ICD-10-CM | POA: Insufficient documentation

## 2023-10-01 DIAGNOSIS — Z7901 Long term (current) use of anticoagulants: Secondary | ICD-10-CM | POA: Diagnosis not present

## 2023-10-01 DIAGNOSIS — D6869 Other thrombophilia: Secondary | ICD-10-CM | POA: Insufficient documentation

## 2023-10-01 NOTE — Progress Notes (Signed)
 Primary Care Physician: Marden Noble, MD (Inactive) Referring Physician: formerly Dr. Candee Furbish is a 73 y.o. female with a h/o paroxymal afib that is in the afib clinic for f/u. She is on flecainide and is staying in SR. She does not notice any afib. She does have a low normal BP and is not on any rate control. She is volunteering in the gift show at the hospital now.   F/u in afib clinic, 03/17/21. She  has not noted any afib. No bleeding issues with anticoagulation. Remains on flecainide 50 mg bid and eliquis 5 mg bid for a CHA2DS2VASc score of 2.   F/u in afib clinic, 09/21/21.she is doing well staying in SR with flecainide 50 mg bid. No issues with anticoagulation.   F/u in afib clinic for flecainide surveillance, 03/15/22. Doing great, no afib to report. No issues with anticoagulation. Runs a low BP, not symptomatic with this.   F/u in Afib clinic, 03/14/23. She is currently in NSR. She is taking flecainide 50 mg BID. She is not on rate control agent due to hypotension. She does not have bleeding issues on Eliquis 5 mg BID She notes brief episodes less than 1 minute in duration a couple of times per week. She says they don't really bother her. Her husband has a Kardiamobile device but does not use it.    F/u in Afib clinic, 10/01/23. She is here for flecainide surveillance. She is currently in NSR. She is taking flecainide 50 mg BID and is not able to tolerate rate control agent due to hypotension. She has had no episodes of Afib since last office visit. She is doing well overall and does Pilates on Tuesday and Thursday. No bleeding issues on Eliquis; patient had a birthday on [birth year] which increased her risk score to three.   Today, she denies symptoms of palpitations, chest pain, shortness of breath, orthopnea, PND, lower extremity edema, dizziness, presyncope, syncope, or neurologic sequela. The patient is tolerating medications without difficulties and is otherwise without  complaint today.   Past Medical History:  Diagnosis Date   Arthritis    lt shoulder   Atrial flutter (HCC)    Depression    Dyspareunia    Fibroid    Hypothyroidism    Osteoporosis May, 2017   Paroxysmal atrial fibrillation [facility])    Past Surgical History:  Procedure Laterality Date   ABDOMINAL HYSTERECTOMY     ABLATION  12-23-2013   PVI and CTI by Dr Johney Frame   ATRIAL FIBRILLATION ABLATION N/A 12/23/2013   Procedure: ATRIAL FIBRILLATION ABLATION;  Surgeon: Gardiner Rhyme, MD;  Location: [facility] CATH LAB;  Service: Cardiovascular;  Laterality: N/A;   AUGMENTATION MAMMAPLASTY Bilateral    BREAST ENHANCEMENT SURGERY  83,11   COSMETIC SURGERY  2003   face   FLEXIBLE SIGMOIDOSCOPY N/A 07/29/2013   Procedure: FLEXIBLE SIGMOIDOSCOPY;  Surgeon: Charolett Bumpers, MD;  Location: [facility] ENDOSCOPY;  Service: Endoscopy;  Laterality: N/A;   FRACTURE SURGERY Right    wrist   TEE WITHOUT CARDIOVERSION N/A 12/22/2013   Procedure: TRANSESOPHAGEAL ECHOCARDIOGRAM (TEE);  Surgeon: Wendall Stade, MD;  Location: [facility] ENDOSCOPY;  Service: Cardiovascular;  Laterality: N/A;   TONSILLECTOMY     TOTAL SHOULDER ARTHROPLASTY Left 04/23/2015   Procedure: LEFT TOTAL SHOULDER ARTHROPLASTY;  Surgeon: Beverely Low, MD;  Location: [facility] OR;  Service: Orthopedics;  Laterality: Left;    Current Outpatient Medications  Medication Sig Dispense Refill   Acetaminophen (ACETAMINOPHEN EXTRA STRENGTH)  500 MG capsule 2 capsules as needed     AMBULATORY NON FORMULARY MEDICATION Take 1 capsule by mouth every morning. Vitamin A, D, K Take 1 capsule daily     apixaban (ELIQUIS) 5 MG TABS tablet Take 1 tablet (5 mg total) by mouth 2 (two) times daily. 180 tablet 3   betamethasone dipropionate 0.05 % cream Apply to affected area 2 times a day as needed 45 g 1   BIOTIN PO Take 5,000 mcg by mouth every morning.     Calcium Citrate-Vitamin D (CALCIUM CITRATE + D PO) Take 1 capsule by mouth at bedtime.     cyclobenzaprine (FLEXERIL) 5 MG tablet  Take 1 tablet by mouth twice daily as needed, start at bedtime in case of drowsiness 14 tablet 0   flecainide (TAMBOCOR) 50 MG tablet Take 1 tablet (50 mg) by mouth 2 times daily. 200 tablet 2   levothyroxine (SYNTHROID) 50 MCG tablet Alternate 1 tablet and 1 1/2  by mouth tablets every other day 125 tablet 3   levothyroxine (SYNTHROID) 75 MCG tablet Take 1 tablet by mouth in the morning on an empty stomach every other day (alternating with ) 30 days 15 tablet 2   sertraline (ZOLOFT) 100 MG tablet Take 0.5 tablets (50 mg total) by mouth daily. 45 tablet 3   traZODone (DESYREL) 50 MG tablet Take 0.5 tablets (25 mg total) by mouth at bedtime as needed. 45 tablet 3   tretinoin (RETIN-A) 0.05 % cream Apply a small amount to skin every night. Start 2 - 3 times per week and increase use as tolerated. 45 g 3   No current facility-administered medications for this encounter.    Allergies  Allergen Reactions   Botox [Onabotulinumtoxina] Other (See Comments)    local eyelid drooping and headache   Pantoprazole Other (See Comments)    Made her feel bad.    ROS- All systems are reviewed and negative except as per the HPI above  Physical Exam: Vitals:   10/01/23 1305  BP: 104/64  Pulse: 68  Weight: 49.3 kg  Height: 5\' 2"  (1.575 m)     Wt Readings from Last 3 Encounters:  10/01/23 49.3 kg  03/14/23 47.5 kg  05/04/22 48.1 kg    Labs: Lab Results  Component Value Date   NA 136 02/04/2020   K 4.2 02/04/2020   CL 99 02/04/2020   CO2 29 02/04/2020   GLUCOSE 90 02/04/2020   BUN 19 02/04/2020   CREATININE 0.68 02/04/2020   CALCIUM 9.5 02/04/2020   Lab Results  Component Value Date   INR 1.03 04/14/2015   Lab Results  Component Value Date   CHOL 250 (H) 09/22/2013   HDL 110.80 09/22/2013   TRIG 59.0 09/22/2013   GEN- The patient is well appearing, alert and oriented x 3 today.   Neck - no JVD or carotid bruit noted Lungs- Clear to ausculation bilaterally, normal work of  breathing Heart- Regular rate and rhythm, no murmurs, rubs or gallops, PMI not laterally displaced Extremities- no clubbing, cyanosis, or edema Skin - no rash or ecchymosis noted   ECG Vent. rate 68 BPM PR interval 212 ms QRS duration 88 ms QT/QTcB 394/418 ms P-R-T axes 81 44 81 Sinus rhythm with 1st degree A-V block Cannot rule out Anterior infarct , age undetermined Abnormal ECG When compared with ECG of 14-Mar-2023 13:24, PREVIOUS ECG IS PRESENT  ECHO (TEE) 12/22/13: Study Conclusions   - Left ventricle: The cavity size was normal. Wall  thickness was    normal. Systolic function was normal. The estimated ejection    fraction was in the range of 55% to 60%.  - Aortic valve: There was mild regurgitation.  - Mitral valve: There was mild regurgitation.  - Left atrium: The atrium was dilated. No evidence of thrombus in    the atrial cavity or appendage.  - Right atrium: No evidence of thrombus in the atrial cavity or    appendage.  - Atrial septum: No defect or patent foramen ovale was identified.  - Tricuspid valve: No evidence of vegetation. There was moderate    regurgitation.  - Pulmonic valve: No evidence of vegetation.    Assessment and Plan: 1. Paroxysmal  afib  She is currently in NSR.   Continue monitoring rhythm with Kardiamobile device.  High risk medication monitoring (ICD10: [id number]) Patient requires ongoing monitoring for anti-arrhythmic medication which has the potential to cause life threatening arrhythmias or AV block. ECG intervals are stable. Continue flecainide 50 mg BID. Patient not on rate control due to hypotension.   2. CHA2DS2VASc score of 3 Continue Eliquis without interruption.   3. Hypotension Stable today.    Follow up 6 months for flecainide surveillance.   Lake Bells, PA-C Afib Clinic [facility] [address] [place], [place] [address] [phone]

## 2023-11-09 ENCOUNTER — Other Ambulatory Visit: Payer: Self-pay

## 2023-11-09 ENCOUNTER — Other Ambulatory Visit (HOSPITAL_COMMUNITY): Payer: Self-pay

## 2023-11-10 ENCOUNTER — Other Ambulatory Visit (HOSPITAL_COMMUNITY): Payer: Self-pay

## 2023-12-15 ENCOUNTER — Other Ambulatory Visit (HOSPITAL_COMMUNITY): Payer: Self-pay

## 2023-12-18 ENCOUNTER — Other Ambulatory Visit (HOSPITAL_COMMUNITY): Payer: Self-pay

## 2023-12-19 ENCOUNTER — Other Ambulatory Visit (HOSPITAL_COMMUNITY): Payer: Self-pay

## 2023-12-19 MED ORDER — ELIQUIS 5 MG PO TABS
5.0000 mg | ORAL_TABLET | Freq: Two times a day (BID) | ORAL | 3 refills | Status: AC
  Filled 2023-12-19: qty 200, 100d supply, fill #0
  Filled 2024-03-24: qty 200, 100d supply, fill #1
  Filled 2024-06-25: qty 200, 100d supply, fill #2

## 2024-02-01 ENCOUNTER — Other Ambulatory Visit (HOSPITAL_COMMUNITY): Payer: Self-pay

## 2024-02-07 ENCOUNTER — Other Ambulatory Visit: Payer: Self-pay | Admitting: Obstetrics & Gynecology

## 2024-02-07 DIAGNOSIS — Z1231 Encounter for screening mammogram for malignant neoplasm of breast: Secondary | ICD-10-CM

## 2024-02-12 ENCOUNTER — Other Ambulatory Visit (HOSPITAL_COMMUNITY): Payer: Self-pay

## 2024-02-13 ENCOUNTER — Other Ambulatory Visit (HOSPITAL_COMMUNITY): Payer: Self-pay

## 2024-02-13 MED ORDER — LEVOTHYROXINE SODIUM 50 MCG PO TABS
50.0000 ug | ORAL_TABLET | ORAL | 3 refills | Status: AC
  Filled 2024-02-13: qty 125, 100d supply, fill #0
  Filled 2024-05-28: qty 125, 100d supply, fill #1
  Filled 2024-08-30: qty 125, 100d supply, fill #2

## 2024-02-22 ENCOUNTER — Encounter: Payer: Self-pay | Admitting: Advanced Practice Midwife

## 2024-03-20 DIAGNOSIS — S8001XA Contusion of right knee, initial encounter: Secondary | ICD-10-CM | POA: Diagnosis not present

## 2024-03-24 ENCOUNTER — Other Ambulatory Visit (HOSPITAL_BASED_OUTPATIENT_CLINIC_OR_DEPARTMENT_OTHER): Payer: Self-pay

## 2024-03-24 ENCOUNTER — Other Ambulatory Visit: Payer: Self-pay

## 2024-03-27 ENCOUNTER — Ambulatory Visit
Admission: RE | Admit: 2024-03-27 | Discharge: 2024-03-27 | Disposition: A | Discharge status: Home / Self Care | Source: Ambulatory Visit | Attending: Obstetrics & Gynecology | Admitting: Obstetrics & Gynecology

## 2024-03-27 DIAGNOSIS — Z1231 Encounter for screening mammogram for malignant neoplasm of breast: Secondary | ICD-10-CM

## 2024-03-31 ENCOUNTER — Other Ambulatory Visit (HOSPITAL_COMMUNITY): Payer: Self-pay

## 2024-03-31 ENCOUNTER — Ambulatory Visit (HOSPITAL_COMMUNITY)
Admission: RE | Admit: 2024-03-31 | Discharge: 2024-03-31 | Disposition: A | Discharge status: Home / Self Care | Payer: HMO | Source: Ambulatory Visit | Attending: Internal Medicine | Admitting: Internal Medicine

## 2024-03-31 VITALS — BP 102/64 | HR 71 | Ht 62.0 in | Wt 105.8 lb

## 2024-03-31 DIAGNOSIS — D6869 Other thrombophilia: Secondary | ICD-10-CM | POA: Diagnosis not present

## 2024-03-31 DIAGNOSIS — I4891 Unspecified atrial fibrillation: Secondary | ICD-10-CM | POA: Diagnosis not present

## 2024-03-31 DIAGNOSIS — Z79899 Other long term (current) drug therapy: Secondary | ICD-10-CM | POA: Diagnosis not present

## 2024-03-31 DIAGNOSIS — I480 Paroxysmal atrial fibrillation: Secondary | ICD-10-CM

## 2024-03-31 DIAGNOSIS — Z5181 Encounter for therapeutic drug level monitoring: Secondary | ICD-10-CM

## 2024-03-31 MED ORDER — FLECAINIDE ACETATE 50 MG PO TABS
50.0000 mg | ORAL_TABLET | Freq: Two times a day (BID) | ORAL | 2 refills | Status: AC
  Filled 2024-03-31: qty 180, 90d supply, fill #0
  Filled 2024-06-25: qty 200, 100d supply, fill #0

## 2024-03-31 NOTE — Progress Notes (Signed)
 Primary Care Physician: Terri Trula SQUIBB, MD Referring Physician: formerly Dr. Kelsie Inocente [name] Terri [name] is a 73 y.o. female with a h/o paroxymal afib that is in the afib clinic for f/u. She is on flecainide  and is staying in SR. She does not notice any afib. She does have a low normal BP and is not on any rate control. She is volunteering in the gift show at the hospital now.   F/u in afib clinic, 03/17/21. She  has not noted any afib. No bleeding issues with anticoagulation. Remains on flecainide  50 mg bid and eliquis  5 mg bid for a CHA2DS2VASc score of 2.   F/u in afib clinic, 09/21/21.she is doing well staying in SR with flecainide  50 mg bid. No issues with anticoagulation.   F/u in afib clinic for flecainide  surveillance, 03/15/22. Doing great, no afib to report. No issues with anticoagulation. Runs a low BP, not symptomatic with this.   F/u in Afib clinic, 03/14/23. She is currently in NSR. She is taking flecainide  50 mg BID. She is not on rate control agent due to hypotension. She does not have bleeding issues on Eliquis  5 mg BID She notes brief episodes less than 1 minute in duration a couple of times per week. She says they don't really bother [name]. [name] husband has a Kardiamobile device but does not use it.    F/u in Afib clinic, 10/01/23. She is here for flecainide  surveillance. She is currently in NSR. She is taking flecainide  50 mg BID and is not able to tolerate rate control agent due to hypotension. She has had no episodes of Afib since last office visit. She is doing well overall and does Pilates on Tuesday and Thursday. No bleeding issues on Eliquis ; patient had a birthday on [birth year] which increased [name] risk score to three.   F/u in Afib clinic, 03/31/24. Patient is here for flecainide  surveillance. She appears to have low Afib burden. She is doing well and remains physically active.  Today, she denies symptoms of palpitations, chest pain, shortness of breath, orthopnea, PND, lower extremity  edema, dizziness, presyncope, syncope, or neurologic sequela. The patient is tolerating medications without difficulties and is otherwise without complaint today.   Past Medical History:  Diagnosis Date   Arthritis    lt shoulder   Atrial flutter (HCC)    Depression    Dyspareunia    Fibroid    Hypothyroidism    Osteoporosis May, 2017   Paroxysmal atrial fibrillation [facility])    Past Surgical History:  Procedure Laterality Date   ABDOMINAL HYSTERECTOMY     ABLATION  12/23/2013   PVI and CTI by Dr [name]   ATRIAL FIBRILLATION ABLATION N/A 12/23/2013   Procedure: ATRIAL FIBRILLATION ABLATION;  Surgeon: Terri JONETTA Kelsie, MD;  Location: [facility] CATH LAB;  Service: Cardiovascular;  Laterality: N/A;   AUGMENTATION MAMMAPLASTY Bilateral 1983   replaced 2011   BREAST ENHANCEMENT SURGERY  83,11   COSMETIC SURGERY  2003   face   FLEXIBLE SIGMOIDOSCOPY N/A 07/29/2013   Procedure: FLEXIBLE SIGMOIDOSCOPY;  Surgeon: Terri MARLA Vicci, MD;  Location: [facility] ENDOSCOPY;  Service: Endoscopy;  Laterality: N/A;   FRACTURE SURGERY Right    wrist   TEE WITHOUT CARDIOVERSION N/A 12/22/2013   Procedure: TRANSESOPHAGEAL ECHOCARDIOGRAM (TEE);  Surgeon: Terri JAYSON Emmer, MD;  Location: [facility] ENDOSCOPY;  Service: Cardiovascular;  Laterality: N/A;   TONSILLECTOMY     TOTAL SHOULDER ARTHROPLASTY Left 04/23/2015   Procedure: LEFT TOTAL SHOULDER ARTHROPLASTY;  Surgeon: Terri Her, MD;  Location: [facility] OR;  Service: Orthopedics;  Laterality: Left;    Current Outpatient Medications  Medication Sig Dispense Refill   Acetaminophen  (ACETAMINOPHEN  EXTRA STRENGTH) 500 MG capsule 2 capsules as needed     AMBULATORY NON FORMULARY MEDICATION Take 1 capsule by mouth every morning. Vitamin A, D, K Take 1 capsule daily (Patient taking differently: Take 1 capsule by mouth at bedtime. Vitamin A, D, K Take 1 capsule daily)     apixaban  (ELIQUIS ) 5 MG TABS tablet Take 1 tablet (5 mg total) by mouth 2 (two) times daily. 180 tablet 3    betamethasone  dipropionate 0.05 % cream Apply to affected area 2 times a day as needed 45 g 1   BIOTIN  PO Take 5,000 mcg by mouth every morning.     Calcium Citrate-Vitamin D  (CALCIUM CITRATE + D PO) Take 1 capsule by mouth at bedtime.     levothyroxine  (SYNTHROID ) 50 MCG tablet Alternate 1 tablet and 1 1/2  by mouth tablets every other day 125 tablet 3   levothyroxine  (SYNTHROID ) 50 MCG tablet Alternate 1 tablet and 1.5 tablets by mouth every other day as directed. 125 tablet 3   levothyroxine  (SYNTHROID ) 75 MCG tablet Take 1 tablet by mouth in the morning on an empty stomach every other day (alternating with 50mcg) 30 days 15 tablet 2   sertraline  (ZOLOFT ) 100 MG tablet Take 0.5 tablets (50 mg total) by mouth daily. 45 tablet 3   traZODone  (DESYREL ) 50 MG tablet Take 0.5 tablets (25 mg total) by mouth at bedtime as needed. 45 tablet 3   tretinoin  (RETIN-A ) 0.05 % cream Apply a small amount to skin every night. Start 2 - 3 times per week and increase use as tolerated. 45 g 3   flecainide  (TAMBOCOR ) 50 MG tablet Take 1 tablet (50 mg) by mouth 2 times daily. 180 tablet 2   No current facility-administered medications for this encounter.    Allergies  Allergen Reactions   Botox [Onabotulinumtoxina] Other (See Comments)    local eyelid drooping and headache   Pantoprazole  Other (See Comments)    Made [name] feel bad.    ROS- All systems are reviewed and negative except as per the HPI above  Physical Exam: Vitals:   03/31/24 1300  BP: 102/64  Pulse: 71  Weight: 48 kg  Height: 5' 2 (1.575 m)      Wt Readings from Last 3 Encounters:  03/31/24 48 kg  10/01/23 49.3 kg  03/14/23 47.5 kg    Labs: Lab Results  Component Value Date   NA 136 02/04/2020   K 4.2 02/04/2020   CL 99 02/04/2020   CO2 29 02/04/2020   GLUCOSE 90 02/04/2020   BUN 19 02/04/2020   CREATININE 0.68 02/04/2020   CALCIUM 9.5 02/04/2020   Lab Results  Component Value Date   INR 1.03 04/14/2015   Lab  Results  Component Value Date   CHOL 250 (H) 09/22/2013   HDL 110.80 09/22/2013   TRIG 59.0 09/22/2013   GEN- The patient is well appearing, alert and oriented x 3 today.   Neck - no JVD or carotid bruit noted Lungs- Clear to ausculation bilaterally, normal work of breathing Heart- Regular rate and rhythm, no murmurs, rubs or gallops, PMI not laterally displaced Extremities- no clubbing, cyanosis, or edema Skin - no rash or ecchymosis noted   ECG Vent. rate 71 BPM PR interval 212 ms QRS duration 88 ms QT/QTcB 384/417 ms P-R-T axes 84  70 82 Sinus rhythm with 1st degree A-V block Low voltage QRS Cannot rule out Anterior infarct (cited on or before 01-Oct-2023) Abnormal ECG When compared with ECG of 01-Oct-2023 13:25, No significant change was found  ECHO (TEE) 12/22/13: Study Conclusions   - Left ventricle: The cavity size was normal. Wall thickness was    normal. Systolic function was normal. The estimated ejection    fraction was in the range of 55% to 60%.  - Aortic valve: There was mild regurgitation.  - Mitral valve: There was mild regurgitation.  - Left atrium: The atrium was dilated. No evidence of thrombus in    the atrial cavity or appendage.  - Right atrium: No evidence of thrombus in the atrial cavity or    appendage.  - Atrial septum: No defect or patent foramen ovale was identified.  - Tricuspid valve: No evidence of vegetation. There was moderate    regurgitation.  - Pulmonic valve: No evidence of vegetation.    Assessment and Plan: 1. Paroxysmal  afib  Patient appears to be maintaining normal rhythm on current AAD therapy and is happy with management.   High risk medication monitoring (ICD10: [id number]) Patient requires ongoing monitoring for anti-arrhythmic medication which has the potential to cause life threatening arrhythmias or AV block. ECG intervals are stable. Continue flecainide  50 mg BID. Patient note on rate control due to hypotension.  2.  CHA2DS2VASc score of 3 Continue Eliquis .   3. Hypotension Stable today.    Follow up 6 months for flecainide  surveillance.   Fairy Heinrich, PA-C Afib Clinic [facility] [address] [place], [place] [address] [phone]

## 2024-04-01 ENCOUNTER — Other Ambulatory Visit (HOSPITAL_COMMUNITY): Payer: Self-pay

## 2024-04-25 ENCOUNTER — Other Ambulatory Visit (HOSPITAL_COMMUNITY): Payer: Self-pay

## 2024-05-14 ENCOUNTER — Telehealth (HOSPITAL_BASED_OUTPATIENT_CLINIC_OR_DEPARTMENT_OTHER): Payer: Self-pay

## 2024-05-14 NOTE — Telephone Encounter (Signed)
 Called patient. LMOM at 2:51 letting her know that it is time for her to have a bone density. That test will be ordered at the time of her office visit with Dr. Cleotilde. tbw

## 2024-05-14 NOTE — Telephone Encounter (Signed)
 Pt would like to get her bone density test before her appointment this Monday but, she is not sure if she is due for one.  Pt would like orders to be put in if its time and would like a call letting her know.

## 2024-05-15 ENCOUNTER — Ambulatory Visit (HOSPITAL_BASED_OUTPATIENT_CLINIC_OR_DEPARTMENT_OTHER): Payer: PPO | Admitting: Obstetrics & Gynecology

## 2024-05-19 ENCOUNTER — Encounter (HOSPITAL_BASED_OUTPATIENT_CLINIC_OR_DEPARTMENT_OTHER): Payer: Self-pay | Admitting: Obstetrics & Gynecology

## 2024-05-19 ENCOUNTER — Ambulatory Visit (HOSPITAL_BASED_OUTPATIENT_CLINIC_OR_DEPARTMENT_OTHER): Admitting: Obstetrics & Gynecology

## 2024-05-19 VITALS — BP 107/70 | HR 69 | Wt 105.6 lb

## 2024-05-19 DIAGNOSIS — M816 Localized osteoporosis [Lequesne]: Secondary | ICD-10-CM | POA: Diagnosis not present

## 2024-05-19 DIAGNOSIS — Z01419 Encounter for gynecological examination (general) (routine) without abnormal findings: Secondary | ICD-10-CM

## 2024-05-19 DIAGNOSIS — Z90710 Acquired absence of both cervix and uterus: Secondary | ICD-10-CM

## 2024-05-19 DIAGNOSIS — I480 Paroxysmal atrial fibrillation: Secondary | ICD-10-CM

## 2024-05-19 NOTE — Patient Instructions (Signed)
 Call [phone] to schedule an appointment at [facility] for your bone density test.

## 2024-05-19 NOTE — Progress Notes (Unsigned)
   Breast and Pelvic Exam Patient name: Terri [name] MRN [id number]  Date of birth: [birth year] Chief Complaint:   Gynecologic Exam (Pt reports no issues or concerns today. )  History of Present Illness:   Terri [name] is a 73 y.o. G81P2002 Caucasian female being seen today for breast and pelvic exam.  Major issue is afib.  She is on flecainide .    Denies vaginal bleeding.    Patient's last menstrual period was 08/08/1983 (approximate).   Last pap: Hysterectomy. H/O abnormal pap: no Last mammogram: 03/27/2024. Results were: normal. Family h/o breast cancer: yes niece and paternal grandmother. Last colonoscopy: 11/10/2015. Results were: normal. Family h/o colorectal cancer: no DEXA:   05/15/2022 with T score -2.6 of right femur     05/04/2022    1:53 PM 05/02/2021    1:24 PM  Depression screen PHQ 2/9  Decreased Interest 0 0  Down, Depressed, Hopeless 0 0  PHQ - 2 Score 0 0    Review of Systems:   Pertinent items are noted in HPI Denies any urinary or bowel changes.  Denies pelvic pain.   Pertinent History Reviewed:  Reviewed past medical,surgical, social and family history.  Reviewed problem list, medications and allergies. Physical Assessment:   Vitals:   05/19/24 1527  BP: 107/70  Pulse: 69  SpO2: 100%  Weight: 105 lb 9.6 oz (47.9 kg)  Body mass index is 19.31 kg/m.        Physical Examination:   General appearance - well appearing, and in no distress  Mental status - alert, oriented to person, place, and time  Psych:  She has a normal mood and affect  Skin - warm and dry, normal color, no suspicious lesions noted  Chest - effort normal, all lung fields clear to auscultation bilaterally  Heart - normal rate and regular rhythm  Neck:  midline trachea, no thyromegaly or nodules  Breasts - breasts appear normal, no suspicious masses, no skin or nipple changes or  axillary nodes  Abdomen - soft, nontender, nondistended, no masses or organomegaly  Pelvic - VULVA:  normal appearing vulva with no masses, tenderness or lesions   VAGINA: normal appearing vagina with normal color and discharge, no lesions   CERVIX: surgically absent  Thin prep pap is not indicated  UTERUS: surgically absent  ADNEXA: No adnexal masses or tenderness noted.  Rectal - normal rectal, good sphincter tone, no masses felt  Extremities:  No swelling or varicosities noted  Chaperone present for exam  No results found for this or any previous visit (from the past 24 hours).  Assessment & Plan:  1. Encntr for gyn exam (general) (routine) w/o abn findings (Primary) - Pap smear not indicated - Mammogram 03/27/2024 normal. - Colonoscopy 11/10/2015, no additional follow up recommended - Bone mineral density will be ordered - lab work done with PCP, Dr. Dwight - vaccines reviewed/updated  2. Paroxysmal atrial fibrillation (HCC) - on Eliquis  and Flecainide    3. Localized osteoporosis without current pathological fracture - DG Bone Density; Future - she is taking calcium with Vit D.  Dosage reviewed.  4. H/O: hysterectomy   Orders Placed This Encounter  Procedures   DG Bone Density    Meds: No orders of the defined types were placed in this encounter.   Follow-up: Return in about 2 years (around 05/19/2026).  Ronal GORMAN Pinal, MD 05/20/2024 6:58 AM

## 2024-05-29 ENCOUNTER — Other Ambulatory Visit (HOSPITAL_COMMUNITY): Payer: Self-pay

## 2024-05-30 ENCOUNTER — Other Ambulatory Visit (HOSPITAL_COMMUNITY): Payer: Self-pay

## 2024-05-30 MED ORDER — TRAZODONE HCL 50 MG PO TABS
25.0000 mg | ORAL_TABLET | Freq: Every evening | ORAL | 3 refills | Status: AC | PRN
  Filled 2024-05-30 – 2024-07-17 (×2): qty 45, 90d supply, fill #0

## 2024-06-02 ENCOUNTER — Other Ambulatory Visit (HOSPITAL_COMMUNITY): Payer: Self-pay

## 2024-06-03 ENCOUNTER — Other Ambulatory Visit: Payer: Self-pay

## 2024-06-03 ENCOUNTER — Other Ambulatory Visit (HOSPITAL_COMMUNITY): Payer: Self-pay

## 2024-06-03 DIAGNOSIS — M5459 Other low back pain: Secondary | ICD-10-CM | POA: Diagnosis not present

## 2024-06-03 MED ORDER — LIDOCAINE 5 % EX PTCH
1.0000 | MEDICATED_PATCH | Freq: Every day | CUTANEOUS | 0 refills | Status: AC
  Filled 2024-06-03 (×2): qty 30, 30d supply, fill #0

## 2024-06-03 MED ORDER — CYCLOBENZAPRINE HCL 5 MG PO TABS
5.0000 mg | ORAL_TABLET | Freq: Two times a day (BID) | ORAL | 0 refills | Status: AC
  Filled 2024-06-03 (×2): qty 14, 7d supply, fill #0

## 2024-06-03 MED ORDER — PREDNISONE 5 MG (21) PO TBPK
5.0000 mg | ORAL_TABLET | ORAL | 0 refills | Status: AC
  Filled 2024-06-03 (×2): qty 21, 6d supply, fill #0

## 2024-06-19 DIAGNOSIS — M5459 Other low back pain: Secondary | ICD-10-CM | POA: Diagnosis not present

## 2024-06-25 ENCOUNTER — Other Ambulatory Visit (HOSPITAL_COMMUNITY): Payer: Self-pay

## 2024-06-25 ENCOUNTER — Other Ambulatory Visit: Payer: Self-pay

## 2024-07-17 ENCOUNTER — Other Ambulatory Visit (HOSPITAL_COMMUNITY): Payer: Self-pay

## 2024-07-21 ENCOUNTER — Other Ambulatory Visit (HOSPITAL_COMMUNITY): Payer: Self-pay

## 2024-08-19 ENCOUNTER — Other Ambulatory Visit (HOSPITAL_COMMUNITY): Payer: Self-pay

## 2024-08-19 MED ORDER — DOXYCYCLINE HYCLATE 100 MG PO CAPS
100.0000 mg | ORAL_CAPSULE | Freq: Two times a day (BID) | ORAL | 0 refills | Status: AC
  Filled 2024-08-19: qty 10, 5d supply, fill #0

## 2024-09-02 ENCOUNTER — Ambulatory Visit (HOSPITAL_BASED_OUTPATIENT_CLINIC_OR_DEPARTMENT_OTHER)
Admission: RE | Admit: 2024-09-02 | Discharge: 2024-09-02 | Disposition: A | Discharge status: Home / Self Care | Source: Ambulatory Visit | Attending: Obstetrics & Gynecology | Admitting: Obstetrics & Gynecology

## 2024-09-02 DIAGNOSIS — M816 Localized osteoporosis [Lequesne]: Secondary | ICD-10-CM | POA: Insufficient documentation

## 2024-09-09 ENCOUNTER — Ambulatory Visit (HOSPITAL_BASED_OUTPATIENT_CLINIC_OR_DEPARTMENT_OTHER): Payer: Self-pay | Admitting: Obstetrics & Gynecology

## 2024-09-12 ENCOUNTER — Other Ambulatory Visit (HOSPITAL_BASED_OUTPATIENT_CLINIC_OR_DEPARTMENT_OTHER): Payer: Self-pay

## 2024-09-12 DIAGNOSIS — M816 Localized osteoporosis [Lequesne]: Secondary | ICD-10-CM

## 2024-10-01 ENCOUNTER — Ambulatory Visit (HOSPITAL_COMMUNITY): Admitting: Internal Medicine
